# Patient Record
Sex: Female | Born: 1950
Health system: Southern US, Community
[De-identification: ages and names within clinical notes are randomized; demographics above are authoritative.]

## PROBLEM LIST (undated history)

## (undated) DIAGNOSIS — T8859XA Other complications of anesthesia, initial encounter: Secondary | ICD-10-CM

## (undated) DIAGNOSIS — F419 Anxiety disorder, unspecified: Secondary | ICD-10-CM

## (undated) DIAGNOSIS — J449 Chronic obstructive pulmonary disease, unspecified: Secondary | ICD-10-CM

## (undated) DIAGNOSIS — R0600 Dyspnea, unspecified: Secondary | ICD-10-CM

## (undated) DIAGNOSIS — G43909 Migraine, unspecified, not intractable, without status migrainosus: Secondary | ICD-10-CM

## (undated) DIAGNOSIS — U071 COVID-19: Secondary | ICD-10-CM

## (undated) DIAGNOSIS — K5792 Diverticulitis of intestine, part unspecified, without perforation or abscess without bleeding: Secondary | ICD-10-CM

## (undated) HISTORY — DX: Migraine, unspecified, not intractable, without status migrainosus: G43.909

## (undated) HISTORY — DX: COVID-19: U07.1

## (undated) HISTORY — DX: Anxiety disorder, unspecified: F41.9

## (undated) HISTORY — PX: CATARACT EXTRACTION: SUR2

---

## 1999-07-26 ENCOUNTER — Emergency Department (HOSPITAL_COMMUNITY): Admission: EM | Admit: 1999-07-26 | Discharge: 1999-07-26 | Payer: Self-pay | Admitting: Emergency Medicine

## 1999-08-13 ENCOUNTER — Emergency Department (HOSPITAL_COMMUNITY): Admission: EM | Admit: 1999-08-13 | Discharge: 1999-08-13 | Payer: Self-pay | Admitting: Emergency Medicine

## 2000-01-04 ENCOUNTER — Emergency Department (HOSPITAL_COMMUNITY): Admission: EM | Admit: 2000-01-04 | Discharge: 2000-01-04 | Payer: Self-pay | Admitting: Emergency Medicine

## 2000-01-08 ENCOUNTER — Emergency Department (HOSPITAL_COMMUNITY): Admission: EM | Admit: 2000-01-08 | Discharge: 2000-01-08 | Payer: Self-pay | Admitting: Emergency Medicine

## 2000-04-27 ENCOUNTER — Encounter: Payer: Self-pay | Admitting: Emergency Medicine

## 2000-04-27 ENCOUNTER — Emergency Department (HOSPITAL_COMMUNITY): Admission: EM | Admit: 2000-04-27 | Discharge: 2000-04-27 | Payer: Self-pay | Admitting: Emergency Medicine

## 2000-08-14 ENCOUNTER — Other Ambulatory Visit: Admission: RE | Admit: 2000-08-14 | Discharge: 2000-08-14 | Payer: Self-pay | Admitting: *Deleted

## 2001-01-09 ENCOUNTER — Emergency Department (HOSPITAL_COMMUNITY): Admission: EM | Admit: 2001-01-09 | Discharge: 2001-01-09 | Payer: Self-pay | Admitting: Emergency Medicine

## 2008-02-23 ENCOUNTER — Emergency Department (HOSPITAL_COMMUNITY): Admission: EM | Admit: 2008-02-23 | Discharge: 2008-02-23 | Payer: Self-pay | Admitting: Emergency Medicine

## 2010-02-20 ENCOUNTER — Emergency Department (HOSPITAL_BASED_OUTPATIENT_CLINIC_OR_DEPARTMENT_OTHER)
Admission: EM | Admit: 2010-02-20 | Discharge: 2010-02-20 | Payer: Self-pay | Source: Home / Self Care | Admitting: Emergency Medicine

## 2010-05-11 ENCOUNTER — Emergency Department (HOSPITAL_COMMUNITY): Payer: Self-pay

## 2010-05-11 ENCOUNTER — Inpatient Hospital Stay (HOSPITAL_COMMUNITY)
Admission: EM | Admit: 2010-05-11 | Discharge: 2010-05-14 | DRG: 392 | Disposition: A | Discharge status: Home / Self Care | Payer: Self-pay | Attending: Internal Medicine | Admitting: Internal Medicine

## 2010-05-11 DIAGNOSIS — J4489 Other specified chronic obstructive pulmonary disease: Secondary | ICD-10-CM | POA: Diagnosis present

## 2010-05-11 DIAGNOSIS — K921 Melena: Secondary | ICD-10-CM | POA: Diagnosis present

## 2010-05-11 DIAGNOSIS — J449 Chronic obstructive pulmonary disease, unspecified: Secondary | ICD-10-CM | POA: Diagnosis present

## 2010-05-11 DIAGNOSIS — K5732 Diverticulitis of large intestine without perforation or abscess without bleeding: Principal | ICD-10-CM | POA: Diagnosis present

## 2010-05-11 LAB — URINALYSIS, ROUTINE W REFLEX MICROSCOPIC
Glucose, UA: NEGATIVE mg/dL
Ketones, ur: NEGATIVE mg/dL
Leukocytes, UA: NEGATIVE
Specific Gravity, Urine: 1.008 (ref 1.005–1.030)
pH: 6 (ref 5.0–8.0)

## 2010-05-11 LAB — COMPREHENSIVE METABOLIC PANEL
ALT: 21 U/L (ref 0–35)
AST: 23 U/L (ref 0–37)
Albumin: 3.4 g/dL — ABNORMAL LOW (ref 3.5–5.2)
Alkaline Phosphatase: 94 U/L (ref 39–117)
BUN: 12 mg/dL (ref 6–23)
CO2: 25 mEq/L (ref 19–32)
Calcium: 8.8 mg/dL (ref 8.4–10.5)
Chloride: 99 mEq/L (ref 96–112)
Creatinine, Ser: 0.79 mg/dL (ref 0.4–1.2)
GFR calc Af Amer: >60 mL/min (ref >60)
GFR calc non Af Amer: >60 mL/min (ref >60)
Glucose, Bld: 94 mg/dL (ref 70–99)
Potassium: 4 mEq/L (ref 3.5–5.1)
Sodium: 133 mEq/L — ABNORMAL LOW (ref 135–145)
Total Bilirubin: 1.5 mg/dL — ABNORMAL HIGH (ref 0.3–1.2)
Total Protein: 7.5 g/dL (ref 6.0–8.3)

## 2010-05-11 LAB — DIFFERENTIAL
Basophils Relative: 0 % (ref 0–1)
Lymphs Abs: 1.1 10*3/uL (ref 0.7–4.0)
Monocytes Relative: 5 % (ref 3–12)
Neutro Abs: 9.6 10*3/uL — ABNORMAL HIGH (ref 1.7–7.7)
Neutrophils Relative %: 84 % — ABNORMAL HIGH (ref 43–77)

## 2010-05-11 LAB — CBC
Hemoglobin: 13.2 g/dL (ref 12.0–15.0)
MCH: 28.9 pg (ref 26.0–34.0)
MCV: 85.7 fL (ref 78.0–100.0)
RBC: 4.56 MIL/uL (ref 3.87–5.11)
WBC: 11.4 10*3/uL — ABNORMAL HIGH (ref 4.0–10.5)

## 2010-05-11 LAB — URINE MICROSCOPIC-ADD ON

## 2010-05-11 LAB — LIPASE, BLOOD: Lipase: 22 U/L (ref 11–59)

## 2010-05-11 MED ORDER — IOHEXOL 300 MG/ML  SOLN
125.0000 mL | Freq: Once | INTRAMUSCULAR | Status: AC | PRN
  Administered 2010-05-11: 125 mL via INTRAVENOUS

## 2010-05-12 LAB — COMPREHENSIVE METABOLIC PANEL
Albumin: 2.5 g/dL — ABNORMAL LOW (ref 3.5–5.2)
BUN: 10 mg/dL (ref 6–23)
Calcium: 7.5 mg/dL — ABNORMAL LOW (ref 8.4–10.5)
Creatinine, Ser: 0.8 mg/dL (ref 0.4–1.2)
Glucose, Bld: 83 mg/dL (ref 70–99)
Potassium: 3.7 mEq/L (ref 3.5–5.1)
Total Protein: 5.7 g/dL — ABNORMAL LOW (ref 6.0–8.3)

## 2010-05-12 LAB — CBC
HCT: 34.6 % — ABNORMAL LOW (ref 36.0–46.0)
Hemoglobin: 11.4 g/dL — ABNORMAL LOW (ref 12.0–15.0)
MCH: 28 pg (ref 26.0–34.0)
MCHC: 32.9 g/dL (ref 30.0–36.0)
Platelets: 192 10*3/uL (ref 150–400)
RDW: 12.6 % (ref 11.5–15.5)
WBC: 6.5 10*3/uL (ref 4.0–10.5)

## 2010-05-12 LAB — PHOSPHORUS: Phosphorus: 2.4 mg/dL (ref 2.3–4.6)

## 2010-05-12 LAB — APTT: aPTT: 34 seconds (ref 24–37)

## 2010-05-12 LAB — PROTIME-INR
INR: 1.07 (ref 0.00–1.49)
Prothrombin Time: 14.1 seconds (ref 11.6–15.2)

## 2010-05-12 LAB — MAGNESIUM: Magnesium: 2.1 mg/dL (ref 1.5–2.5)

## 2010-05-13 LAB — DIFFERENTIAL
Basophils Absolute: 0 10*3/uL (ref 0.0–0.1)
Eosinophils Relative: 2 % (ref 0–5)
Lymphocytes Relative: 19 % (ref 12–46)
Lymphs Abs: 1.1 10*3/uL (ref 0.7–4.0)
Neutro Abs: 4.3 10*3/uL (ref 1.7–7.7)
Neutrophils Relative %: 73 % (ref 43–77)

## 2010-05-13 LAB — CBC
HCT: 34 % — ABNORMAL LOW (ref 36.0–46.0)
MCV: 84.6 fL (ref 78.0–100.0)
Platelets: 213 10*3/uL (ref 150–400)
RBC: 4.02 MIL/uL (ref 3.87–5.11)
RDW: 12.3 % (ref 11.5–15.5)
WBC: 5.9 10*3/uL (ref 4.0–10.5)

## 2010-05-13 LAB — BASIC METABOLIC PANEL
BUN: 8 mg/dL (ref 6–23)
Chloride: 111 mEq/L (ref 96–112)
GFR calc non Af Amer: >60 mL/min (ref >60)
Glucose, Bld: 92 mg/dL (ref 70–99)
Potassium: 3.5 mEq/L (ref 3.5–5.1)
Sodium: 139 mEq/L (ref 135–145)

## 2010-05-13 NOTE — Consult Note (Signed)
NAME:  [name] [name], [name] [name]                ACCOUNT NO.:  [id number]  MEDICAL RECORD NO.:  [id number]           PATIENT TYPE:  I  LOCATION:  1519                         FACILITY:  [facility]  PHYSICIAN:  Adolph Pollack, M.D.DATE OF BIRTH:  07/06/1950  DATE OF CONSULTATION:  05/11/2010 DATE OF DISCHARGE:                                CONSULTATION   REFERRING PHYSICIAN:  Antoine Primas, DO, from [facility] Emergency Department.  REASON FOR CONSULTATION:  Diverticulitis.  HISTORY:  [name] [name] is a 60 year old female who 4 days ago began having lower abdominal pain that began progressively increasing.  The pain was made worse by eating.  She started having diarrhea as well.  No fever. Because of the persistence of the pain, she presented to the emergency room where she was evaluated.  She is known to have a mild leukocytosis and lower abdominal tenderness.  She subsequently underwent a CT scan of the abdomen demonstrating findings consistent with some sigmoiddiverticulitis and reactive small bowel segment thickening with a very small locule of extraluminal air near this process.  She is being admitted by [facility], so we were asked to see her as well.  PAST MEDICAL HISTORY:  COPD.  PREVIOUS OPERATIONS:  Cesarean section.  ALLERGIES:  None.  MEDICATIONS:  None.  SOCIAL HISTORY:  She is initially from [place] and she is here with her son.  She speaks and understands [place] well.  She is a former smoker and quit 3 months ago.  No recreational drug use.  Occasional alcohol use.  REVIEW OF SYSTEMS:  CARDIAC:  Denies hypertension, diabetes.  GI: Denies peptic ulcer disease, hematochezia.  GU:  Denies any kidney stones or dysuria.  ENDOCRINE:  Denies diabetes, hypercholesterolemia. NEUROLOGIC:  No strokes or seizures.  HEMATOLOGIC:  No bleeding disorders or blood clots.  PHYSICAL EXAMINATION:  GENERAL:  Overweight female in no acute distress, pleasant, and cooperative. VITAL  SIGNS:  Temperature is 97.8, blood pressure 102/66, pulse 91, respiratory rate 18. NECK:  Supple without mass. EYES:  Extraocular motions intact.  No icterus. RESPIRATORY:  Breath sounds distant, but equal and clear. CARDIOVASCULAR:  Regular rate, regular rhythm. ABDOMEN:  Soft with some left lower quadrant tenderness to palpation and percussion.  No palpable masses noted.  Hypoactive bowel sounds noted. MUSCULOSKELETAL:  No edema.  LABORATORY DATA:  Notable for white blood cell count of 11,400 with a leftward shift.  Hemoglobin 13.2.  Sodium 133, otherwise electrolytes within normal limits.  Albumin, slightly low at 3.4.  Urinalysis essentially negative.  CT scan was reviewed.  IMPRESSION:  Acute sigmoid diverticulitis, likely with some reactive small bowel edema.  No indication for urgent surgical intervention at this time.  RECOMMENDATIONS: 1. IV antibiotics and bowel rest until she is pain-free, white cell     count is normal, and she is no longer tender.  Diet can then be     started and advanced to a solid diet at which time she could be     switched over to oral antibiotics and discharged home on these. 2. Would obtain a colonoscopy in 4-6 weeks. 3. Unless she worsens  or has recurring episodes, I do not think she     needs any operative intervention at this time.     Adolph Pollack, M.D.     Kari Baars  D:  05/11/2010  T:  05/12/2010  Job:  [id number]  Electronically Signed by Avel Peace M.D. on 05/13/2010 04:25:39 PM

## 2010-05-13 NOTE — H&P (Signed)
[name] [name], PISCITELLI                ACCOUNT NO.:  [id number]  MEDICAL RECORD NO.:  [id number]           PATIENT TYPE:  E  LOCATION:  WLED                         FACILITY[facility]  [facility]  PHYSICIAN:  Tana Felts, MD     DATE OF BIRTH:  06/18/1950  DATE OF ADMISSION:  05/11/2010 DATE OF DISCHARGE:                             HISTORY & PHYSICAL   CHIEF COMPLAINT:  Abdominal pain.  HISTORY OF PRESENT ILLNESS:  This is a 60 year old woman originally from [place], who presents with 4 days of abdominal pain, started off in her lower abdomen.  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  Unfortunately, as of this morning, her pain had actually increased.  She was afraid to eat even though she did have a sensation of hunger and was having diarrhea, which was nonbloody.  She did try to drink some pineapple juice hoping that this will make things feel better and ate a little bit of potatoes and although she did keep down her food, her pain increased, so she went to see primary care physician in the community, who recommended that she go immediately to the emergency room.  She was given some hydrocodone by her son, who had some leftover from his wisdom teeth and she says this helps a little bit.  When I see her, she says that her pain has improved quite a bit and that she had hoped she would not have to stay in the hospital.  In the emergency room, she was given IV fluids and some pain medication and was evaluated by general surgery with whom I discussed the case.  REVIEW OF SYSTEMS:  Twelve-point review of systems is negative except as noted above.  PAST MEDICAL HISTORY:  COPD.  FAMILY HISTORY:  Noncontributory.  PAST SURGICAL HISTORY:  Cesarean section.  SOCIAL HISTORY:  Patient quit smoking about 3 months ago though she had one cigarette today to calm  her nerves.  She is a occasional drinker and has no history of drug use.  She is originally from [place], but has not traveled outside of the [place] in over 2 years.  She works as Artist.  ALLERGIES:  No known drug allergies.  MEDICATIONS:  None.  PHYSICAL EXAMINATION:  VITAL SIGNS:  Temperature 97.8, pulse 91, blood pressure 102/66, respiratory 18, satting 97% on room air. GENERAL:  This is a well-nourished, slightly overweight Hispanic woman, who is mildly tearful, very cooperative with the interview and exam. HEENT:  Pupils are equal, round and reactive to light, slightly constricted.  She has no scleral icterus or conjunctivitis.  She has moist mucous membranes.  Oropharynx is clear. NECK:  Supple. LUNGS:  Clear to auscultation bilaterally, though breath sounds were somewhat diminished. CARDIAC EXAMINATION:  Reveals regular rate and rhythm.  No murmurs, rubs or gallops. ABDOMEN:  Soft, mildly tender in the right upper quadrant epigastric region and more prominently tender in the left lower quadrant with some rebound in that  area, though no guarding. EXTREMITIES:  Warm and well-perfused with no cyanosis, clubbing or edema and good peripheral pulses. SKIN:  Slightly dry. NEUROLOGICALLY:  She is grossly intact.  No focal deficits.  LABORATORY DATA:  CBC showed a white count of 11.4, hemoglobin of 13.2, platelets 201,000 with slight left shift, 84% neutrophils.  Lactate was normal at 1.2.  CMP was notable for mildly elevated bilirubin at 1.5, slightly low sodium and albumin, otherwise normal.  Lipase is normal. Urinalysis and UA were unremarkable.  DIAGNOSTIC STUDIES:  CT scan was performed and revealed edema and inflammation involving the sigmoid mesocolon tracking to a short segment of small bowel, which has some wall thickening.  On the CT it was unclear which bowel was the source of the inflammation.  There was a small locular extraluminal gas between  the small bowel loop and the colon thought to be related to colonic diverticulitis with microperforation and secondary irritation of the small bowel loop.  A small bowel perforation cannot be excluded.  IMPRESSION AND PLAN:  This is a 60 year old woman who is generally healthy, who presents with 4 days of acute abdominal pain and anorexia and is found to have what is likely colonic diverticulitis.  After discussion with the surgeon, we decided to initially manage her medically.  We will start her on Zosyn, continue intravenous fluids and keep her n.p.o. with the plan to advance her diet once her pain has resolved and her abdominal exam is normal.  The patient is in agreement with this plan.  We will also give her intravenous morphine for pain and intravenous Zofran for nausea as needed and Lovenox for deep venous thrombosis prophylaxis.  The patient will also need a social work consult as she is uninsured, does not have a primary care physician to lead coordination of care after discharge.     Tana Felts, MD     NB/MEDQ  D:  05/11/2010  T:  05/11/2010  Job:  [id number]  Electronically Signed by Tana Felts M.D. on 05/13/2010 09:35:47 AM

## 2010-05-23 NOTE — Discharge Summary (Signed)
NAMESEJAL, COFIELD                ACCOUNT NO.:  [id number]  MEDICAL RECORD NO.:  [id number]           PATIENT TYPE:  I  LOCATION:  1519                         [facility]ACILITY[facility]  [facility]  PHYSICIAN:  Altha Harm, MDDATE OF BIRTH:  May 16, 1950  DATE OF ADMISSION:  05/11/2010 DATE OF DISCHARGE:  05/14/2010                              DISCHARGE SUMMARY   DISCHARGE DISPOSITION:  Home.  FINAL DISCHARGE DIAGNOSES: 1. Diverticulitis. 2. Microperforation of the sigmoid colon. 3. Abdominal pain, improved. 4. Past history of chronic obstructive pulmonary disease quiescent.  DISCHARGE MEDICATIONS: 1. Ciprofloxacin 500 mg p.o. b.i.d. for 10 days. 2. Flagyl 500 mg p.o. q.8 h. for 10 days. 3. Calcium carbonate over-the-counter 1 tablet p.o. daily. 4. Glucosamine and Chondroitin over-the-counter 1 tablet p.o. daily. 5. Ibuprofen 400-600 mg p.o. q.8 h. p.r.n. pain. 6. Icy hot menthol patch transdermally every 8 hours as needed. 7. Multivitamin 1 tablet p.o. daily. 8. Vitamin E over-the-counter 1 capsule p.o. daily.  CONSULTANT:  Adolph Pollack, M.D., General Surgery.  PROCEDURES:  None.  DIAGNOSTIC STUDIES:  CT abdomen and pelvis with contrast, which shows edema, inflammation identified in the sigmoid mesocolon, which tracks directly to a very short segment of abdominal-appearing small bowel. Bowel wall thickening actually appears more substantial in the small bowel, then in the sigmoid colon where there is underlying diverticular change in this region of the sigmoid colon.  It is impossible to tell from the CT imaging, which bowel loop has the extent of inflammatory change.  While no fluid collection or abscesses evidence, there is a small locule of extraluminal gas position, equal distance between the small bowel loop and the colon.  Given the very short segment of small bowel involvement, I suspect these changes are related to colonic diverticulitis with microperforation and  secondary to irritation of the adjacent small bowel loops, with a primary small bowel microperforation causing secondary inflammation of the sigmoid colon cannot be excluded.  PRIMARY CARE PHYSICIAN:  Unassigned.  The patient then tends to make an appointment with Dr. Della Goo.  CODE STATUS:  Full code.  ALLERGIES:  No known drug allergies.  CHIEF COMPLAINT:  Abdominal pain.  HISTORY OF PRESENT ILLNESS:  Please refer to the H and P by Dr. Tana Felts for details of the HPI.  However, in short this is a 60 year old Hispanic female who presents to the emergency with abdominal pain, which she has had for several days.  The patient was brought to an urgent care and examination showed rebound.  She was sent to the emergency room for further evaluation and management.  FINDINGS:  On the CT scan was noted above and the patient was referred to [facility] for evaluation and admission.  HOSPITAL COURSE: 1. Sigmoid diverticulitis with microperforation.  The patient was seen     in consultation with surgery who recommended broad-spectrum     antibiotics and bowel rest.  The patient was then started on a     clear liquid diet and advance as tolerated a low-fiber diet, which     she tolerated well.  The patient is to complete another  10 days of     antibiotics to complete 14 days.  She should have a colonoscopy     performed in 6-8 weeks as per recommendation of General Surgery.  I     have spoken with the PA, Tresa Endo, who states that Dr. Andrey Campanile will be     happy to see the patient in the office and to make the referred for     colonoscopy alternating with the patient can see Dr. Della Goo with the appointment and she can refer her colonoscopy in 6-     8 weeks.  I have spoken with the patient in Spanish and impressed     upon her the importance of the colonoscopy and the patient will     call to make that appointment.  Presently, the patient should not     return  to work until she was seen by Dr. 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  Once the patient is seen by Dr. Lovell Sheehan in     evaluating the outpatient setting, she can then be released back to     work if appropriate.  PHYSICAL EXAMINATION:  VITAL SIGNS:  At the time of discharge, the patient is stable.  Her vital signs are as follows, temperature is 98.1, heart rate 81, respiratory rate 18, blood pressure 142/80, and O2 sats are 96% on room air. HEENT:  The patient is normocephalic, atraumatic.  Pupils are equally round and reactive to light and accommodation.  Extraocular movements are intact.  Oropharynx is moist.  No exudate, erythema, or lesions are noted. NECK:  Trachea is midline.  No masses.  No thyromegaly.  No JVD.  No carotid bruits. LUNGS:  Clear to auscultation.  No wheezing or rhonchi noted. CARDIOVASCULAR:  She has got normal S1 and S2.  No murmurs, rubs, or gallops noted.  PMI is nondisplaced.  No heaves or thrills on palpation. ABDOMEN:  The patient had mild tenderness in the left lower quadrant, however, there is no guarding, rebound, masses or hepatosplenomegaly noted. EXTREMITIES:  No clubbing, cyanosis, or edema. NEUROLOGIC:  There are no focal neurological deficits. PSYCHIATRIC:  She is alert and oriented x3.  Good insight and cognition. Good recent and remote recall.  Of note, the patient had a bowel movement today after having had a low- fiber diet.  There was a small amount of dark blood, which the surgeons feel is just residual old blood.  FOLLOWUP:  The patient is to follow up with Dr. Lovell Sheehan in approximately 1 week.  She should have colonoscopy done as an outpatient in 6-8 weeks.  DIETARY RESTRICTIONS:  The patient should be on a low-residue diet and I will ask the nurse to give her care notes regarding a low-residue diet.  Total time for this discharge process including  face-to-face time approximately 42 minutes.     Altha Harm, MD     MAM/MEDQ  D:  05/14/2010  T:  05/15/2010  Job:  [id number]  cc:   Della Goo, M.D. Fax: [phone]  Dr. Andrey Campanile  Electronically Signed by Marthann Schiller MD on 05/23/2010 08:12:59 AM

## 2014-10-26 ENCOUNTER — Emergency Department (HOSPITAL_COMMUNITY): Payer: 59

## 2014-10-26 ENCOUNTER — Encounter (HOSPITAL_COMMUNITY): Payer: Self-pay

## 2014-10-26 ENCOUNTER — Inpatient Hospital Stay (HOSPITAL_COMMUNITY)
Admission: EM | Admit: 2014-10-26 | Discharge: 2014-11-04 | DRG: 164 | Disposition: A | Discharge status: Home / Self Care | Payer: 59 | Attending: Surgery | Admitting: Surgery

## 2014-10-26 DIAGNOSIS — Z7951 Long term (current) use of inhaled steroids: Secondary | ICD-10-CM | POA: Diagnosis not present

## 2014-10-26 DIAGNOSIS — J930 Spontaneous tension pneumothorax: Secondary | ICD-10-CM

## 2014-10-26 DIAGNOSIS — J9383 Other pneumothorax: Principal | ICD-10-CM | POA: Diagnosis present

## 2014-10-26 DIAGNOSIS — J939 Pneumothorax, unspecified: Secondary | ICD-10-CM

## 2014-10-26 DIAGNOSIS — J9382 Other air leak: Secondary | ICD-10-CM | POA: Diagnosis not present

## 2014-10-26 DIAGNOSIS — F17210 Nicotine dependence, cigarettes, uncomplicated: Secondary | ICD-10-CM | POA: Diagnosis present

## 2014-10-26 DIAGNOSIS — J9811 Atelectasis: Secondary | ICD-10-CM | POA: Diagnosis not present

## 2014-10-26 DIAGNOSIS — Z79899 Other long term (current) drug therapy: Secondary | ICD-10-CM

## 2014-10-26 DIAGNOSIS — J449 Chronic obstructive pulmonary disease, unspecified: Secondary | ICD-10-CM | POA: Diagnosis present

## 2014-10-26 DIAGNOSIS — Z9689 Presence of other specified functional implants: Secondary | ICD-10-CM

## 2014-10-26 DIAGNOSIS — J9311 Primary spontaneous pneumothorax: Secondary | ICD-10-CM | POA: Diagnosis not present

## 2014-10-26 HISTORY — DX: Chronic obstructive pulmonary disease, unspecified: J44.9

## 2014-10-26 HISTORY — DX: Diverticulitis of intestine, part unspecified, without perforation or abscess without bleeding: K57.92

## 2014-10-26 LAB — COMPREHENSIVE METABOLIC PANEL
ALBUMIN: 4 g/dL (ref 3.5–5.0)
ALT: 26 U/L (ref 14–54)
ANION GAP: 10 (ref 5–15)
AST: 30 U/L (ref 15–41)
Alkaline Phosphatase: 86 U/L (ref 38–126)
BUN: 12 mg/dL (ref 6–20)
CHLORIDE: 107 mmol/L (ref 101–111)
CO2: 21 mmol/L — AB (ref 22–32)
Calcium: 9.6 mg/dL (ref 8.9–10.3)
Creatinine, Ser: 0.74 mg/dL (ref 0.44–1.00)
GFR calc Af Amer: >60 mL/min (ref >60)
GFR calc non Af Amer: >60 mL/min (ref >60)
GLUCOSE: 126 mg/dL — AB (ref 65–99)
POTASSIUM: 3.8 mmol/L (ref 3.5–5.1)
SODIUM: 138 mmol/L (ref 135–145)
Total Bilirubin: 0.6 mg/dL (ref 0.3–1.2)
Total Protein: 7.3 g/dL (ref 6.5–8.1)

## 2014-10-26 LAB — CBC WITH DIFFERENTIAL/PLATELET
BASOS ABS: 0 10*3/uL (ref 0.0–0.1)
BASOS PCT: 0 %
EOS ABS: 0 10*3/uL (ref 0.0–0.7)
Eosinophils Relative: 0 %
HCT: 42.4 % (ref 36.0–46.0)
Hemoglobin: 14.4 g/dL (ref 12.0–15.0)
Lymphocytes Relative: 11 %
Lymphs Abs: 0.8 10*3/uL (ref 0.7–4.0)
MCH: 28.5 pg (ref 26.0–34.0)
MCHC: 34 g/dL (ref 30.0–36.0)
MCV: 84 fL (ref 78.0–100.0)
MONO ABS: 0.1 10*3/uL (ref 0.1–1.0)
MONOS PCT: 1 %
NEUTROS PCT: 88 %
Neutro Abs: 6.6 10*3/uL (ref 1.7–7.7)
Platelets: 257 10*3/uL (ref 150–400)
RBC: 5.05 MIL/uL (ref 3.87–5.11)
RDW: 13.2 % (ref 11.5–15.5)
WBC: 7.5 10*3/uL (ref 4.0–10.5)

## 2014-10-26 LAB — I-STAT TROPONIN, ED: Troponin i, poc: 0 ng/mL (ref 0.00–0.08)

## 2014-10-26 MED ORDER — SODIUM CHLORIDE 0.9 % IJ SOLN
3.0000 mL | Freq: Two times a day (BID) | INTRAMUSCULAR | Status: DC
  Administered 2014-10-27 – 2014-10-29 (×3): 3 mL via INTRAVENOUS

## 2014-10-26 MED ORDER — SENNA 8.6 MG PO TABS
1.0000 | ORAL_TABLET | Freq: Two times a day (BID) | ORAL | Status: DC
  Administered 2014-10-27 – 2014-10-30 (×6): 8.6 mg via ORAL
  Filled 2014-10-26 (×7): qty 1

## 2014-10-26 MED ORDER — MONTELUKAST SODIUM 10 MG PO TABS
10.0000 mg | ORAL_TABLET | Freq: Every day | ORAL | Status: DC | PRN
  Filled 2014-10-26: qty 1

## 2014-10-26 MED ORDER — SODIUM CHLORIDE 0.9 % IJ SOLN: 3.0000 mL | INTRAMUSCULAR | Status: DC | PRN

## 2014-10-26 MED ORDER — DOCUSATE SODIUM 100 MG PO CAPS
100.0000 mg | ORAL_CAPSULE | Freq: Two times a day (BID) | ORAL | Status: DC
  Administered 2014-10-27 – 2014-10-30 (×6): 100 mg via ORAL
  Filled 2014-10-26 (×5): qty 1

## 2014-10-26 MED ORDER — SODIUM CHLORIDE 0.9 % IV SOLN: 250.0000 mL | INTRAVENOUS | Status: DC | PRN

## 2014-10-26 MED ORDER — IPRATROPIUM-ALBUTEROL 0.5-2.5 (3) MG/3ML IN SOLN
3.0000 mL | Freq: Three times a day (TID) | RESPIRATORY_TRACT | Status: DC | PRN
  Administered 2014-10-27 – 2014-11-02 (×6): 3 mL via RESPIRATORY_TRACT
  Filled 2014-10-26 (×7): qty 3

## 2014-10-26 MED ORDER — OXYCODONE HCL 5 MG PO TABS
10.0000 mg | ORAL_TABLET | Freq: Once | ORAL | Status: AC
  Administered 2014-10-26: 10 mg via ORAL
  Filled 2014-10-26 (×2): qty 2

## 2014-10-26 MED ORDER — ALBUTEROL SULFATE (2.5 MG/3ML) 0.083% IN NEBU
2.5000 mg | INHALATION_SOLUTION | Freq: Four times a day (QID) | RESPIRATORY_TRACT | Status: DC | PRN
  Administered 2014-10-30 – 2014-11-02 (×3): 2.5 mg via RESPIRATORY_TRACT
  Filled 2014-10-26 (×3): qty 3

## 2014-10-26 MED ORDER — KETOROLAC TROMETHAMINE 15 MG/ML IJ SOLN
15.0000 mg | Freq: Four times a day (QID) | INTRAMUSCULAR | Status: AC | PRN
  Administered 2014-10-26 – 2014-10-31 (×2): 15 mg via INTRAVENOUS
  Filled 2014-10-26: qty 1

## 2014-10-26 MED ORDER — ONDANSETRON HCL 4 MG/2ML IJ SOLN
4.0000 mg | Freq: Four times a day (QID) | INTRAMUSCULAR | Status: DC | PRN
  Administered 2014-10-27 – 2014-10-31 (×3): 4 mg via INTRAVENOUS
  Filled 2014-10-26 (×2): qty 2

## 2014-10-26 MED ORDER — BUDESONIDE-FORMOTEROL FUMARATE 80-4.5 MCG/ACT IN AERO: 2.0000 | INHALATION_SPRAY | Freq: Two times a day (BID) | RESPIRATORY_TRACT | Status: DC | PRN

## 2014-10-26 MED ORDER — OXYCODONE HCL 5 MG PO TABS
10.0000 mg | ORAL_TABLET | ORAL | Status: DC | PRN
  Administered 2014-10-27 – 2014-10-29 (×6): 10 mg via ORAL
  Filled 2014-10-26 (×6): qty 2

## 2014-10-26 MED ORDER — ACETAMINOPHEN 325 MG PO TABS
650.0000 mg | ORAL_TABLET | Freq: Four times a day (QID) | ORAL | Status: DC | PRN
  Administered 2014-10-27 – 2014-10-30 (×3): 650 mg via ORAL
  Filled 2014-10-26 (×4): qty 2

## 2014-10-26 MED ORDER — MORPHINE SULFATE (PF) 4 MG/ML IV SOLN
4.0000 mg | INTRAVENOUS | Status: DC | PRN
  Administered 2014-10-27: 4 mg via INTRAVENOUS
  Filled 2014-10-26 (×2): qty 1

## 2014-10-26 MED ORDER — ALBUTEROL SULFATE (2.5 MG/3ML) 0.083% IN NEBU: 5.0000 mg | INHALATION_SOLUTION | Freq: Once | RESPIRATORY_TRACT | Status: DC

## 2014-10-26 MED ORDER — LIDOCAINE-EPINEPHRINE (PF) 2 %-1:200000 IJ SOLN
10.0000 mL | Freq: Once | INTRAMUSCULAR | Status: AC
  Administered 2014-10-26: 10 mL via INTRADERMAL
  Filled 2014-10-26: qty 20

## 2014-10-26 MED ORDER — SODIUM CHLORIDE 0.9 % IJ SOLN
3.0000 mL | Freq: Two times a day (BID) | INTRAMUSCULAR | Status: DC
  Administered 2014-10-26 – 2014-10-30 (×9): 3 mL via INTRAVENOUS

## 2014-10-26 MED ORDER — ALBUTEROL SULFATE (2.5 MG/3ML) 0.083% IN NEBU
INHALATION_SOLUTION | RESPIRATORY_TRACT | Status: AC
  Filled 2014-10-26: qty 3

## 2014-10-26 MED ORDER — ENOXAPARIN SODIUM 40 MG/0.4ML ~~LOC~~ SOLN
40.0000 mg | Freq: Every day | SUBCUTANEOUS | Status: DC
  Administered 2014-10-27 – 2014-10-29 (×4): 40 mg via SUBCUTANEOUS
  Filled 2014-10-26 (×4): qty 0.4

## 2014-10-26 MED ORDER — ONDANSETRON HCL 4 MG PO TABS: 4.0000 mg | ORAL_TABLET | Freq: Four times a day (QID) | ORAL | Status: DC | PRN

## 2014-10-26 MED ORDER — ACETAMINOPHEN 650 MG RE SUPP: 650.0000 mg | Freq: Four times a day (QID) | RECTAL | Status: DC | PRN

## 2014-10-26 NOTE — ED Provider Notes (Signed)
CSN: [id number]     Arrival date & time 10/26/14  1938 History   First MD Initiated Contact with Patient 10/26/14 2028     Chief Complaint  Patient presents with  . Shortness of Breath   Patient is a 64 y.o. female presenting with general illness. The history is provided by the patient. No language interpreter was used.  Illness Location:  Chest Quality:  Pain, SOB Severity:  Moderate Onset quality: Gradual initially, acute worsening today. Timing:  Constant Progression:  Worsening Chronicity:  New Context:  PMHx of COPD presenting with SOB & CP. Onset 5 days ago. Denies trauma or inciting events. Progressive with acute worsening this morning. Patient denies fever, chills, cough, or previous history of similar symptoms. Associated symptoms: chest pain and shortness of breath   Associated symptoms: no abdominal pain, no congestion, no cough, no diarrhea, no fever, no headaches, no loss of consciousness, no nausea, no vomiting and no wheezing     Past Medical History  Diagnosis Date  . COPD (chronic obstructive pulmonary disease)   . Diverticulitis    Past Surgical History  Procedure Laterality Date  . Cesarean section     No family history on file. Social History  Substance Use Topics  . Smoking status: Current Every Day Smoker  . Smokeless tobacco: None  . Alcohol Use: No   OB History    No data available      Review of Systems  Constitutional: Negative for fever.  HENT: Negative for congestion.   Respiratory: Positive for shortness of breath. Negative for cough and wheezing.   Cardiovascular: Positive for chest pain.  Gastrointestinal: Negative for nausea, vomiting, abdominal pain and diarrhea.  Neurological: Negative for loss of consciousness and headaches.  All other systems reviewed and are negative.   Allergies  Review of patient's allergies indicates no known allergies.  Home Medications   Prior to Admission medications   Medication Sig Start Date End  Date Taking? Authorizing Provider  albuterol (PROVENTIL HFA;VENTOLIN HFA) 108 (90 BASE) MCG/ACT inhaler Inhale 1-2 puffs into the lungs every 6 (six) hours as needed for wheezing or shortness of breath.   Yes Historical Provider, MD  budesonide-formoterol (SYMBICORT) 80-4.5 MCG/ACT inhaler Inhale 2 puffs into the lungs 2 (two) times daily as needed (shortness of breath).   Yes Historical Provider, MD  ibuprofen (ADVIL,MOTRIN) 200 MG tablet Take 200 mg by mouth every 6 (six) hours as needed for mild pain or moderate pain.   Yes Historical Provider, MD  ipratropium-albuterol (DUONEB) 0.5-2.5 (3) MG/3ML SOLN Take 3 mLs by nebulization 3 (three) times daily as needed (shortness of breath, wheezing).  09/12/14  Yes Historical Provider, MD  montelukast (SINGULAIR) 10 MG tablet Take 10 mg by mouth daily as needed (allergies).  10/02/14  Yes Historical Provider, MD  predniSONE (DELTASONE) 10 MG tablet as directed. 6 day supply dosepack 10/26/14  Yes Historical Provider, MD   BP 136/85 mmHg  Pulse 97  Temp(Src) 98.4 F (36.9 C) (Oral)  Resp 24  Ht  (1.651 m)  Wt 178 lb 8 oz (80.967 kg)  BMI 29.70 kg/m2  SpO2 100%   Physical Exam  Constitutional: She is oriented to person, place, and time. She appears well-developed and well-nourished. She appears distressed.  HENT:  Head: Normocephalic and atraumatic.  Eyes: Conjunctivae are normal. Pupils are equal, round, and reactive to light.  Neck: Normal range of motion. Neck supple.  Cardiovascular: Regular rhythm and intact distal pulses.   Borderline tachycardia  Pulmonary/Chest: She has no wheezes. She has no rales.  Tachypneic, decreased breath sounds on left, saturations mid 90s on 2 L nasal cannula  Abdominal: Soft. Bowel sounds are normal.  Musculoskeletal: Normal range of motion.  Neurological: She is alert and oriented to person, place, and time.  Skin: Skin is warm and dry. She is not diaphoretic.  Nursing note and vitals reviewed.   ED  Course  Procedures   Labs Review Labs Reviewed  COMPREHENSIVE METABOLIC PANEL - Abnormal; Notable for the following:    CO2 21 (*)    Glucose, Bld 126 (*)    All other components within normal limits  CBC WITH DIFFERENTIAL/PLATELET  Rosezena Sensor, ED   Imaging Review Dg Chest 2 View  10/26/2014   CLINICAL DATA:  Shortness of breath for 2 days.  EXAM: CHEST  2 VIEW  COMPARISON:  02/23/2008  FINDINGS: The cardiac silhouette, mediastinal and hilar contours are within normal limits and stable. There is tortuosity and calcification of the thoracic aorta. Advanced emphysematous changes are again demonstrated with compressive atelectasis in the lower lung zones. There is a large left-sided pneumothorax estimated at 60%. No pleural effusions. The bony thorax is intact.  IMPRESSION: 1. Large left-sided pneumothorax estimated at 60% 2. Emphysematous changes and compressive lower lobe atelectasis and scarring. These results were called by telephone at the time of interpretation on 10/26/2014 at 8:20 pm to [facility], ED charge nurse, who verbally acknowledged these results.   Electronically Signed   By: Rudie Meyer M.D.   On: 10/26/2014 20:20   Dg Chest Portable 1 View  10/26/2014   CLINICAL DATA:  Left chest tube placed.  EXAM: PORTABLE CHEST 1 VIEW  COMPARISON:  10/26/2014  FINDINGS: Interval placement a left chest tube with near complete evacuation of the left pneumothorax. There is probably a small residual apical pneumothorax. There is re-expansion of most of the left lung with residual atelectasis in the left lung base. Right lung is expanded. Heart size and pulmonary vascularity are normal. Calcification of the aorta.  IMPRESSION: Interval placement of left chest tube with near complete evacuation of previous left pneumothorax. Only small residual pneumothorax remains. Left lung is re-expanded with residual atelectasis in the left lung base.   Electronically Signed   By: Burman Nieves M.D.   On:  10/26/2014 21:41   I have personally reviewed and evaluated these images and lab results as part of my medical decision-making.   EKG Interpretation None      MDM  Ms. Chasse is a 64 yo female w/ PMHx of COPD presenting with SOB & CP. Onset 5 days ago. Denies trauma or inciting events. Progressive with acute worsening this morning. Patient denies fever, chills, cough, or previous history of similar symptoms.  Elderly female lying in the stretcher in moderate distress secondary to pain. Afebrile. Borderline tachycardic. BP 130's/100s. Tachypneic, decreased breath sounds on left, saturations mid 90's on 2 L nasal cannula Abd benign. CV RRR. Remainder of examination unremarkable.  Chest x-ray showing left-sided pneumothorax with 60% lung collapse. CBC & CMP unremarkable. I-STAT troponin undetectable. Laboratory and imaging results were personally reviewed by myself and used in the medical decision making of this patient's treatment and disposition.  Cardiothoracic surgery consult at and informed about condition of patient and evaluated in the emergency department with placement of left-sided chest tube.  Pt noted to cardiothoracic surgery for further evaluation and management of large left-sided pneumothorax status post chest tube placement. Pt understands and agrees with  the plan and has no further questions or concerns.   Pt care discussed with and followed by my attending, Dr. Thurmon Fair, MD Pager [phone]  Final diagnoses:  Pneumothorax, left    Angelina Ok, MD 10/26/14 [id number]  Elwin Mocha, MD 10/26/14 [phone]

## 2014-10-26 NOTE — ED Notes (Signed)
Pt reports SOB, onset yesterday, hx of COPD. She went to PCP today, given nebulizer and a prescription for prednisone but states she not gotten any better. SOB worse with exertion.

## 2014-10-26 NOTE — CV Procedure (Signed)
Chest Tube Insertion Procedure Note  Indications:  Clinically significant left Pneumothorax  Pre-operative Diagnosis: Left Pneumothorax  Post-operative Diagnosis: Left Pneumothorax  Procedure Details  Informed consent was obtained for the procedure, including sedation.  Risks of lung perforation, hemorrhage, arrhythmia, and adverse drug reaction were discussed.   After sterile skin prep, using standard technique, a 20 French tube was placed in the left lateral 7th rib space.  Findings: Large rush of air  Estimated Blood Loss:  Minimal         Specimens:  None              Complications:  None; patient tolerated the procedure well.         Disposition: admit         Condition: stable  Attending Attestation: I performed the procedure.

## 2014-10-26 NOTE — ED Notes (Signed)
Pt states she has already had 4 or 5 breathing treatments of albuterol/atrovent today.

## 2014-10-26 NOTE — H&P (Signed)
[address]       [place], [address]             [phone]      Cardithoracic Surgery Admission History and Physical    Jaleiah Asay is an 64 y.o. female.   Chief Complaint: Shortness of breath and left chest pain HPI:   The patient is a 64 year old Hispanic woman with know COPD who smokes 1 ppd who had onset of left chest pain and shortness of breath about 5 days ago. Her symptoms progressed this am and she went to her PCP. She was apparently wheezing a lot and refused to go to the ER so she was started on a Prednisone taper in addition to her usual COPD meds. She continued to have significant shortness of breath throughout the day and was brought to the ER by her son. CXE showed a 60% left pneumothorax.  Past Medical History  Diagnosis Date  . COPD (chronic obstructive pulmonary disease)   . Diverticulitis     Past Surgical History  Procedure Laterality Date  . Cesarean section      No family history on file. Social History:  reports that she has been smoking.  She does not have any smokeless tobacco history on file. She reports that she does not drink alcohol. Her drug history is not on file.  Allergies: No Known Allergies   (Not in a hospital admission)  Results for orders placed or performed during the hospital encounter of 10/26/14 (from the past 48 hour(s))  I-Stat Troponin, ED (not at [facility])     Status: None   Collection Time: 10/26/14  7:56 PM  Result Value Ref Range   Troponin i, poc 0.00 0.00 - 0.08 ng/mL   Comment 3            Comment: Due to the release kinetics of cTnI, a negative result within the first hours of the onset of symptoms does not rule out myocardial infarction with certainty. If myocardial infarction is still suspected, repeat the test at appropriate intervals.   CBC with Differential     Status: None   Collection Time: 10/26/14  7:59 PM  Result Value Ref Range   WBC 7.5 4.0 - 10.5 K/uL   RBC 5.05 3.87 - 5.11  MIL/uL   Hemoglobin 14.4 12.0 - 15.0 g/dL   HCT 42.4 36.0 - 46.0 %   MCV 84.0 78.0 - 100.0 fL   MCH 28.5 26.0 - 34.0 pg   MCHC 34.0 30.0 - 36.0 g/dL   RDW 13.2 11.5 - 15.5 %   Platelets 257 150 - 400 K/uL   Neutrophils Relative % 88 %   Neutro Abs 6.6 1.7 - 7.7 K/uL   Lymphocytes Relative 11 %   Lymphs Abs 0.8 0.7 - 4.0 K/uL   Monocytes Relative 1 %   Monocytes Absolute 0.1 0.1 - 1.0 K/uL   Eosinophils Relative 0 %   Eosinophils Absolute 0.0 0.0 - 0.7 K/uL   Basophils Relative 0 %   Basophils Absolute 0.0 0.0 - 0.1 K/uL  Comprehensive metabolic panel     Status: Abnormal   Collection Time: 10/26/14  7:59 PM  Result Value Ref Range   Sodium 138 135 - 145 mmol/L   Potassium 3.8 3.5 - 5.1 mmol/L   Chloride 107 101 - 111 mmol/L   CO2 21 (L) 22 - 32 mmol/L   Glucose, Bld 126 (H) 65 - 99 mg/dL  BUN 12 6 - 20 mg/dL   Creatinine, Ser 0.74 0.44 - 1.00 mg/dL   Calcium 9.6 8.9 - 10.3 mg/dL   Total Protein 7.3 6.5 - 8.1 g/dL   Albumin 4.0 3.5 - 5.0 g/dL   AST 30 15 - 41 U/L   ALT 26 14 - 54 U/L   Alkaline Phosphatase 86 38 - 126 U/L   Total Bilirubin 0.6 0.3 - 1.2 mg/dL   GFR calc non Af Amer >60 >60 mL/min   GFR calc Af Amer >60 >60 mL/min    Comment: (NOTE) The eGFR has been calculated using the CKD EPI equation. This calculation has not been validated in all clinical situations. eGFR's persistently <60 mL/min signify possible Chronic Kidney Disease.    Anion gap 10 5 - 15   Dg Chest 2 View  10/26/2014   CLINICAL DATA:  Shortness of breath for 2 days.  EXAM: CHEST  2 VIEW  COMPARISON:  02/23/2008  FINDINGS: The cardiac silhouette, mediastinal and hilar contours are within normal limits and stable. There is tortuosity and calcification of the thoracic aorta. Advanced emphysematous changes are again demonstrated with compressive atelectasis in the lower lung zones. There is a large left-sided pneumothorax estimated at 60%. No pleural effusions. The bony thorax is intact.   IMPRESSION: 1. Large left-sided pneumothorax estimated at 60% 2. Emphysematous changes and compressive lower lobe atelectasis and scarring. These results were called by telephone at the time of interpretation on 10/26/2014 at 8:20 pm to [facility], ED charge nurse, who verbally acknowledged these results.   Electronically Signed   By: Marijo Sanes M.D.   On: 10/26/2014 20:20   Dg Chest Portable 1 View  10/26/2014   CLINICAL DATA:  Left chest tube placed.  EXAM: PORTABLE CHEST 1 VIEW  COMPARISON:  10/26/2014  FINDINGS: Interval placement a left chest tube with near complete evacuation of the left pneumothorax. There is probably a small residual apical pneumothorax. There is re-expansion of most of the left lung with residual atelectasis in the left lung base. Right lung is expanded. Heart size and pulmonary vascularity are normal. Calcification of the aorta.  IMPRESSION: Interval placement of left chest tube with near complete evacuation of previous left pneumothorax. Only small residual pneumothorax remains. Left lung is re-expanded with residual atelectasis in the left lung base.   Electronically Signed   By: Lucienne Capers M.D.   On: 10/26/2014 21:41    Review of Systems  Constitutional: Positive for malaise/fatigue. Negative for fever, chills and weight loss.  HENT: Negative.   Eyes: Negative.   Respiratory: Positive for cough, shortness of breath and wheezing. Negative for hemoptysis and sputum production.   Cardiovascular: Positive for chest pain and orthopnea. Negative for palpitations, leg swelling and PND.  Gastrointestinal: Negative.   Genitourinary: Negative.   Musculoskeletal: Negative.   Skin: Negative.   Neurological: Negative.   Endo/Heme/Allergies: Negative.   Psychiatric/Behavioral: Negative.     Blood pressure 136/85, pulse 97, temperature 98.4 F (36.9 C), temperature source Oral, resp. rate 24, height _0  (1.651 m), weight 80.967 kg (178 lb 8 oz), SpO2 100 %. Physical Exam    Constitutional: She is oriented to person, place, and time. She appears well-developed and well-nourished.  Looks uncomfortable  HENT:  Head: Normocephalic and atraumatic.  Mouth/Throat: Oropharynx is clear and moist.  Eyes: EOM are normal. Pupils are equal, round, and reactive to light.  Neck: Normal range of motion. Neck supple. No JVD present. No tracheal deviation present.  Cardiovascular: Normal rate, regular rhythm and intact distal pulses.  Exam reveals no gallop and no friction rub.   No murmur heard. Respiratory: No stridor. No respiratory distress. She has wheezes.  Increased work of breathing. Decreased breath sounds on the left  GI: Soft. Bowel sounds are normal. She exhibits no distension and no mass. There is no tenderness.  Musculoskeletal: Normal range of motion. She exhibits no edema or tenderness.  Lymphadenopathy:    She has no cervical adenopathy.  Neurological: She is alert and oriented to person, place, and time.  Skin: Skin is warm and dry.  Psychiatric: She has a normal mood and affect.     Assessment/Plan  This 64 year old heavy lifelong smoker with COPD presents with her first episode of spontaneous left pneumothorax. A chest tube was inserted into the left pleural space with resolution of the ptx and a 1 chamber intermittent air leak. Admit for chest tube management, pain control. I strongly advised her and her son to quit smoking.   Gaye Pollack 10/26/2014, 10:17 PM

## 2014-10-27 ENCOUNTER — Inpatient Hospital Stay (HOSPITAL_COMMUNITY): Payer: 59

## 2014-10-27 MED ORDER — CALCIUM CARBONATE ANTACID 500 MG PO CHEW
1.0000 | CHEWABLE_TABLET | Freq: Three times a day (TID) | ORAL | Status: DC | PRN
  Administered 2014-10-27: 200 mg via ORAL
  Filled 2014-10-27 (×2): qty 1

## 2014-10-27 MED ORDER — RISAQUAD PO CAPS
2.0000 | ORAL_CAPSULE | Freq: Two times a day (BID) | ORAL | Status: DC
  Administered 2014-10-27 – 2014-11-04 (×15): 2 via ORAL
  Filled 2014-10-27 (×21): qty 2

## 2014-10-27 NOTE — Discharge Summary (Signed)
[address]       [place] [address]             [phone]            Discharge Summary  Name: [name] [name] DOB: 1950-06-27 64 y.o. MRN: [id number]   Admission Date: 10/26/2014 Discharge Date: 11/04/2014    Admitting Diagnosis: Spontaneous left pneumothorax   Discharge Diagnosis:  Spontaneous left pneumothorax Persistent air leak  Past Medical History  Diagnosis Date  . COPD (chronic obstructive pulmonary disease)   . Diverticulitis    Tobacco abuse    Procedures: 1. Placement of left chest tube - 10/26/2014  2. Left video assisted thoracoscopy - 10/31/2014  Stapling of apical bleb  Mechanical pleurodesis    HPI:  The patient is a 64 y.o. female smoker with known COPD.  She developed left sided chest pain and shortness of breath about 5 days ago.Her symptoms progressed and she went to her PCP yesterday. She was apparently wheezing a lot and refused to go to the ER, so she was started on a Prednisone taper in addition to her usual COPD meds. She continued to have significant shortness of breath throughout the day and was brought to the ER by her son. Chest x-ray showed a 60% left pneumothorax. Dr. Laneta Simmers saw the patient in the ER and a left chest tube was placed.  She was subsequently admitted for chest tube management.     Hospital Course:  The patient was admitted to [facility] on 10/26/2014. Her chest tube was placed to suction with resolution of her pneumothorax. However, when chest tube was weaned to water seal, she developed subcutaneous emphysema and a small air leak. Repeat chest x-ray showed a small basilar pneumothorax. The chest tube was left on water seal, but her air leak persisted.  Therefore, she was taken to the operating room on 10/31/2014, where she underwent a left VATS for stapling of apical bleb.    Postoperatively, she has remained stable.  Her chest tube was initially left to suction, then transitioned to water seal as  tolerated.  Her air leak resolved and chest x-ray remained stable with no pneumothorax. Chest tube was removed on postop day 3.  She has otherwise done well postoperatively.  She is ambulating in the halls without difficulty and is tolerating a regular diet.  Pain is controlled on oral pain meds. She has been weaned off supplemental oxygen.  Overall, the patient is medically stable on today's date for discharge home.    Recent vital signs:  Filed Vitals:   11/04/14 0417  BP: 126/67  Pulse: 89  Temp: 98.3 F (36.8 C)  Resp: 18    Recent laboratory studies:  CBC: No results for input(s): WBC, HGB, HCT, PLT in the last 72 hours. BMET:  No results for input(s): NA, K, CL, CO2, GLUCOSE, BUN, CREATININE, CALCIUM in the last 72 hours.  PT/INR: No results for input(s): LABPROT, INR in the last 72 hours.    Discharge Medications:     Medication List    TAKE these medications        albuterol 108 (90 BASE) MCG/ACT inhaler  Commonly known as:  PROVENTIL HFA;VENTOLIN HFA  Inhale 1-2 puffs into the lungs every 6 (six) hours as needed for wheezing or shortness of breath.     budesonide-formoterol 80-4.5 MCG/ACT inhaler  Commonly known as:  SYMBICORT  Inhale 2 puffs into the lungs 2 (two) times daily as needed (  shortness of breath).     ibuprofen 200 MG tablet  Commonly known as:  ADVIL,MOTRIN  Take 200 mg by mouth every 6 (six) hours as needed for mild pain or moderate pain.     ipratropium-albuterol 0.5-2.5 (3) MG/3ML Soln  Commonly known as:  DUONEB  Take 3 mLs by nebulization 3 (three) times daily as needed (shortness of breath, wheezing).     montelukast 10 MG tablet  Commonly known as:  SINGULAIR  Take 10 mg by mouth daily as needed (allergies).     oxyCODONE 5 MG immediate release tablet  Commonly known as:  Oxy IR/ROXICODONE  Take 1-2 tablets (5-10 mg total) by mouth every 4 (four) hours as needed for severe pain.     predniSONE 10 MG tablet  Commonly known as:   DELTASONE  as directed. 6 day supply dosepack         Discharge Instructions:  The patient is to refrain from driving, heavy lifting or strenuous activity.  May shower daily and clean incisions with soap and water.  May resume regular diet.    Follow Up: Follow-up Information    Follow up with Alleen Borne, MD On 11/29/2014.   Specialty:  Cardiothoracic Surgery   Why:  Have a chest x-ray at [facility] at 9:30, then see MD at 10:30   Contact information:   [address] [place] [address] [phone]       Follow up with TCTS RN On 11/09/2014.   Why:  For suture removal at 10:00        Follow-up Information    Follow up with Alleen Borne, MD On 11/29/2014.   Specialty:  Cardiothoracic Surgery   Why:  Have a chest x-ray at [facility] at 9:30, then see MD at 10:30   Contact information:   [address] [place] [address] [phone]       Follow up with TCTS RN On 11/09/2014.   Why:  For suture removal at 10:00       Taliah Porche 11/04/2014, 10:04 AM

## 2014-10-27 NOTE — Progress Notes (Addendum)
      [address]       [place] [address]             [phone]            Subjective: Patient's only complaint is having to go to bathroom this morning. She states her breathing is better  Objective: Vital signs in last 24 hours: Temp:  [97.3 F (36.3 C)-98.5 F (36.9 C)] 97.3 F (36.3 C) (09/23 0522) Pulse Rate:  [74-104] 74 (09/23 0522) Cardiac Rhythm:  [-] Normal sinus rhythm (09/22 2249) Resp:  [20-30] 20 (09/23 0522) BP: (118-156)/(63-108) 126/79 mmHg (09/23 0522) SpO2:  [91 %-100 %] 100 % (09/23 0522) FiO2 (%):  [100 %] 100 % (09/22 2049) Weight:  [177 lb 1.6 oz (80.332 kg)-178 lb 8 oz (80.967 kg)] 177 lb 1.6 oz (80.332 kg) (09/22 2254)       Physical Exam:  Cardiovascular: RRR Pulmonary: Diminished at left apex; no rales, wheezes, or rhonchi. Abdomen: Soft, non tender, bowel sounds present. Wounds: Dressing is clean and dry.   Chest Tube: to suction and no air leak  Lab Results: CBC: Recent Labs  10/26/14 1959  WBC 7.5  HGB 14.4  HCT 42.4  PLT 257   BMET:  Recent Labs  10/26/14 1959  NA 138  K 3.8  CL 107  CO2 21*  GLUCOSE 126*  BUN 12  CREATININE 0.74  CALCIUM 9.6    PT/INR: No results for input(s): LABPROT, INR in the last 72 hours. ABG:  INR: Will add last result for INR, ABG once components are confirmed Will add last 4 CBG results once components are confirmed  Assessment/Plan:  1. CV - SR in the 70's. 2.  Pulmonary - Chest tube is to suction. There is no air leak. CXR this am appears to show small left pneumothorax and atelectasis left lung base. Likely place chest tube to water seal in am. Check CXR in am   ZIMMERMAN,DONIELLE MPA-C 10/27/2014,7:27 AM   Chart reviewed, patient examined, agree with above. I do not see any air leak and the CXR looks good. I don't think there is a ptx. I think it is a bleb. Will put tube to water seal and if CXR looks ok tomorrow and no air leak I think the tube can come  out.

## 2014-10-27 NOTE — Plan of Care (Signed)
Problem: Phase I Progression Outcomes Goal: OOB as tolerated unless otherwise ordered Outcome: Progressing Patient sitting on the side of the bed.

## 2014-10-27 NOTE — Progress Notes (Signed)
UR Completed. Samantha Claxton, RN, BSN.  [phone] 

## 2014-10-28 ENCOUNTER — Inpatient Hospital Stay (HOSPITAL_COMMUNITY): Payer: 59

## 2014-10-28 NOTE — Progress Notes (Signed)
Pt with increasing shortness of breath after ambulation to bathroom, this was after chest tube had been clamped by P Physician Assistant. PA on floor and made aware, pts Sats 98 on 2L, orders received to unclamp tube. Will continue to monitor patient. Cloer, [employer] [place]] RN

## 2014-10-28 NOTE — Progress Notes (Addendum)
      [address]       [place] [address]             [phone]            Subjective: Patient about to eat breakfast. She hopes the chest tube "comes out today"  Objective: Vital signs in last 24 hours: Temp:  [98.4 F (36.9 C)] 98.4 F (36.9 C) (09/24 0538) Pulse Rate:  [83] 83 (09/24 0538) Cardiac Rhythm:  [-] Normal sinus rhythm (09/23 2032) Resp:  [18] 18 (09/24 0538) BP: (114)/(66) 114/66 mmHg (09/24 0538) SpO2:  [90 %-97 %] 90 % (09/24 0538)    09/23 0701 - 09/24 0700 In: 243 [P.O.:240; I.V.:3] Out: 1060 [Urine:1050; Chest Tube:10]  Physical Exam:  Cardiovascular: RRR Pulmonary: Diminished at left apex; no rales, wheezes, or rhonchi. Abdomen: Soft, non tender, bowel sounds present. Wounds: Dressing is clean and dry.   Chest Tube: to water seal. There is tidling with cough but no true air leak  Lab Results: CBC:  Recent Labs  10/26/14 1959  WBC 7.5  HGB 14.4  HCT 42.4  PLT 257   BMET:   Recent Labs  10/26/14 1959  NA 138  K 3.8  CL 107  CO2 21*  GLUCOSE 126*  BUN 12  CREATININE 0.74  CALCIUM 9.6    PT/INR: No results for input(s): LABPROT, INR in the last 72 hours. ABG:  INR: Will add last result for INR, ABG once components are confirmed Will add last 4 CBG results once components are confirmed  Assessment/Plan:  1. CV - SR in the 70's. 2.  Pulmonary - Chest tube is to water seal. There is tidling with cough but no true air leak. CXR this am appears stable ( likely left apical bleb and not pneumothorax, and atelectasis left lung base). As discussed with Dr. Donata Clay, will clamp chest tube and check CXR in 4 hours. Also, check CXR in am   ZIMMERMAN,DONIELLE MPA-C 10/28/2014,7:30 AM   Patient did not tolerate chest tube clamping We'll leave to water seal and repeat x-ray in a.m. patient examined and medical record reviewed,agree with above note. Kathlee Nations Trigt III 10/28/2014

## 2014-10-29 ENCOUNTER — Inpatient Hospital Stay (HOSPITAL_COMMUNITY): Payer: 59

## 2014-10-29 MED ORDER — TRAMADOL HCL 50 MG PO TABS
50.0000 mg | ORAL_TABLET | Freq: Four times a day (QID) | ORAL | Status: DC | PRN
  Administered 2014-10-29: 50 mg via ORAL
  Administered 2014-10-29 – 2014-10-30 (×3): 100 mg via ORAL
  Administered 2014-10-30: 50 mg via ORAL
  Filled 2014-10-29: qty 1
  Filled 2014-10-29: qty 2
  Filled 2014-10-29: qty 1
  Filled 2014-10-29 (×2): qty 2

## 2014-10-29 MED ORDER — OXYCODONE HCL 5 MG PO TABS: 5.0000 mg | ORAL_TABLET | ORAL | Status: DC | PRN

## 2014-10-29 MED ORDER — IOHEXOL 300 MG/ML  SOLN
100.0000 mL | Freq: Once | INTRAMUSCULAR | Status: AC | PRN
  Administered 2014-10-29: 75 mL via INTRAVENOUS

## 2014-10-29 NOTE — Progress Notes (Addendum)
      [address]       [place] [address]             [phone]            Subjective: Patient about to eat breakfast. She had nausea and emesis after Oxy yesterday.   Objective: Vital signs in last 24 hours: Temp:  [98.1 F (36.7 C)-98.4 F (36.9 C)] 98.4 F (36.9 C) (09/25 0505) Pulse Rate:  [74-81] 80 (09/25 0505) Cardiac Rhythm:  [-] Normal sinus rhythm (09/25 0700) Resp:  [17-18] 18 (09/25 0505) BP: (112-129)/(65-72) 112/65 mmHg (09/25 0505) SpO2:  [97 %-99 %] 97 % (09/25 0505)    09/24 0701 - 09/25 0700 In: 483 [P.O.:480; I.V.:3] Out: -   Physical Exam:  Cardiovascular: RRR Pulmonary: Diminished at left apex; no rales, wheezes, or rhonchi. Abdomen: Soft, non tender, bowel sounds present. Wounds: Dressing is clean and dry.   Chest Tube: to water seal. There is no  air leak  Lab Results: CBC:  Recent Labs  10/26/14 1959  WBC 7.5  HGB 14.4  HCT 42.4  PLT 257   BMET:   Recent Labs  10/26/14 1959  NA 138  K 3.8  CL 107  CO2 21*  GLUCOSE 126*  BUN 12  CREATININE 0.74  CALCIUM 9.6    PT/INR: No results for input(s): LABPROT, INR in the last 72 hours. ABG:  INR: Will add last result for INR, ABG once components are confirmed Will add last 4 CBG results once components are confirmed  Assessment/Plan:  1. CV - SR in the 70's. 2.  Pulmonary - Chest tube is to water seal. There is no air leak. CXR this am shows no pneumothorax, subcutaneous emphysema left lateral chest wall,  and left basilar atelectasis. Per Dr. Donata Clay, chest tube clamed and will obtain a CXR in 4 hours. 3. Regarding pain control, will order Ultram and she wants to try 1 Oxy for breakthrough pain. If she has nausea or emesis today, stop Oxy.  ZIMMERMAN,DONIELLE MPA-C 10/29/2014,7:34 AM    The patient became very short of breath and anxious after chest tube was clamped Per bedside nurse there was diminished breath sounds on the left and the clamp was  removed following which the patient felt much better. CT scan of chest shows small basilar pneumothorax, no suspicious nodules or masses or adenopathy, generalized emphysema. With cough this p.m. there is an air leak present so will not pull tube at this time  patient examined and medical record reviewed,agree with above note. Kathlee Nations Trigt III 10/29/2014   . Marland Kitchen

## 2014-10-30 ENCOUNTER — Inpatient Hospital Stay (HOSPITAL_COMMUNITY): Payer: 59

## 2014-10-30 LAB — TYPE AND SCREEN
ABO/RH(D): A NEG
ANTIBODY SCREEN: NEGATIVE

## 2014-10-30 LAB — ABO/RH: ABO/RH(D): A NEG

## 2014-10-30 MED ORDER — DEXTROSE 5 % IV SOLN
1.5000 g | INTRAVENOUS | Status: AC
  Administered 2014-10-31: 1.5 g via INTRAVENOUS
  Filled 2014-10-30 (×2): qty 1.5

## 2014-10-30 NOTE — Progress Notes (Addendum)
      [address]       [place] [address]             [phone]            Subjective: Patient without complaints  Objective: Vital signs in last 24 hours: Temp:  [97.8 F (36.6 C)-98.7 F (37.1 C)] 97.8 F (36.6 C) (09/26 0437) Pulse Rate:  [80-95] 80 (09/26 0437) Cardiac Rhythm:  [-] Normal sinus rhythm (09/25 1950) Resp:  [17-18] 18 (09/26 0437) BP: (120-123)/(71-77) 122/71 mmHg (09/26 0437) SpO2:  [95 %-96 %] 96 % (09/26 0437)    09/25 0701 - 09/26 0700 In: 240 [P.O.:240] Out: -   Physical Exam:  Cardiovascular: RRR Pulmonary: Diminished throughout  Abdomen: Soft, non tender, bowel sounds present. Wounds: Dressing is clean and dry.   Chest Tube: to water seal. There is a small +1 intermittent air leak  Lab Results: CBC: No results for input(s): WBC, HGB, HCT, PLT in the last 72 hours. BMET:  No results for input(s): NA, K, CL, CO2, GLUCOSE, BUN, CREATININE, CALCIUM in the last 72 hours.  PT/INR: No results for input(s): LABPROT, INR in the last 72 hours. ABG:  INR: Will add last result for INR, ABG once components are confirmed Will add last 4 CBG results once components are confirmed  Assessment/Plan:  1. CV - SR in the 70's. 2.  Pulmonary - Chest tube is to water seal. There is a small intermittent air leak. CT scan done yesterday showed small left basilar ptx, left chest wall subcutaneous emphysema, moderate to severe emphysema.CXR this am appears stable. Chest tube to remain to water seal for now. Check CXR in am   ZIMMERMAN,DONIELLE MPA-C 10/30/2014,7:39 AM   Chart reviewed, patient examined, agree with above. Events of weekend noted. I did not see any air leak on Friday and then she developed air leak over the weekend with CT showing a small basilar and anterior ptx. She has a constant 1 chamber air leak today. I think the best course of action is to proceed with left VATS and bleb stapling, mechanical pleurodesis tomorrow to  try to resolve this air leak as fast as possible. I discussed the procedure with her including alternatives, benefits and risks including but not limited to bleeding, infection, persistent or recurrent air leak and she understands and agrees to proceed. Will schedule for tomorrow.   Marland Kitchen Marland Kitchen

## 2014-10-31 ENCOUNTER — Inpatient Hospital Stay (HOSPITAL_COMMUNITY): Payer: 59

## 2014-10-31 ENCOUNTER — Inpatient Hospital Stay (HOSPITAL_COMMUNITY): Payer: 59 | Admitting: Anesthesiology

## 2014-10-31 ENCOUNTER — Encounter (HOSPITAL_COMMUNITY)
Admission: EM | Disposition: A | Discharge status: Home / Self Care | Payer: Self-pay | Source: Home / Self Care | Attending: Surgery

## 2014-10-31 DIAGNOSIS — J9311 Primary spontaneous pneumothorax: Secondary | ICD-10-CM

## 2014-10-31 HISTORY — PX: STAPLING OF BLEBS: SHX6429

## 2014-10-31 HISTORY — PX: VIDEO ASSISTED THORACOSCOPY: SHX5073

## 2014-10-31 LAB — MRSA PCR SCREENING: MRSA BY PCR: NEGATIVE

## 2014-10-31 SURGERY — VIDEO ASSISTED THORACOSCOPY
Anesthesia: General | Site: Chest | Laterality: Left

## 2014-10-31 MED ORDER — ROCURONIUM BROMIDE 100 MG/10ML IV SOLN
INTRAVENOUS | Status: DC | PRN
  Administered 2014-10-31: 5 mg via INTRAVENOUS
  Administered 2014-10-31: 40 mg via INTRAVENOUS
  Administered 2014-10-31: 5 mg via INTRAVENOUS

## 2014-10-31 MED ORDER — DEXTROSE 5 % IV SOLN
1.5000 g | Freq: Two times a day (BID) | INTRAVENOUS | Status: AC
  Administered 2014-10-31 – 2014-11-01 (×2): 1.5 g via INTRAVENOUS
  Filled 2014-10-31 (×2): qty 1.5

## 2014-10-31 MED ORDER — KCL IN DEXTROSE-NACL 20-5-0.45 MEQ/L-%-% IV SOLN
INTRAVENOUS | Status: DC
  Administered 2014-10-31 – 2014-11-02 (×3): via INTRAVENOUS
  Filled 2014-10-31 (×4): qty 1000

## 2014-10-31 MED ORDER — POTASSIUM CHLORIDE 10 MEQ/50ML IV SOLN: 10.0000 meq | Freq: Every day | INTRAVENOUS | Status: DC | PRN

## 2014-10-31 MED ORDER — GLYCOPYRROLATE 0.2 MG/ML IJ SOLN
INTRAMUSCULAR | Status: DC | PRN
  Administered 2014-10-31: 0.4 mg via INTRAVENOUS

## 2014-10-31 MED ORDER — FENTANYL CITRATE (PF) 100 MCG/2ML IJ SOLN
INTRAMUSCULAR | Status: DC | PRN
  Administered 2014-10-31: 25 ug via INTRAVENOUS
  Administered 2014-10-31 (×3): 50 ug via INTRAVENOUS

## 2014-10-31 MED ORDER — NALOXONE HCL 0.4 MG/ML IJ SOLN: 0.4000 mg | INTRAMUSCULAR | Status: DC | PRN

## 2014-10-31 MED ORDER — ROCURONIUM BROMIDE 50 MG/5ML IV SOLN
INTRAVENOUS | Status: AC
  Filled 2014-10-31: qty 1

## 2014-10-31 MED ORDER — ONDANSETRON HCL 4 MG/2ML IJ SOLN: 4.0000 mg | Freq: Four times a day (QID) | INTRAMUSCULAR | Status: DC | PRN

## 2014-10-31 MED ORDER — ACETAMINOPHEN 500 MG PO TABS
1000.0000 mg | ORAL_TABLET | Freq: Four times a day (QID) | ORAL | Status: DC
  Administered 2014-11-01 – 2014-11-04 (×14): 1000 mg via ORAL
  Filled 2014-10-31 (×19): qty 2

## 2014-10-31 MED ORDER — PROMETHAZINE HCL 25 MG/ML IJ SOLN
INTRAMUSCULAR | Status: AC
  Filled 2014-10-31: qty 1

## 2014-10-31 MED ORDER — SENNOSIDES-DOCUSATE SODIUM 8.6-50 MG PO TABS
1.0000 | ORAL_TABLET | Freq: Every day | ORAL | Status: DC
  Administered 2014-10-31 – 2014-11-02 (×3): 1 via ORAL
  Filled 2014-10-31 (×4): qty 1

## 2014-10-31 MED ORDER — BISACODYL 5 MG PO TBEC
10.0000 mg | DELAYED_RELEASE_TABLET | Freq: Every day | ORAL | Status: DC
  Administered 2014-11-01 – 2014-11-03 (×3): 10 mg via ORAL
  Filled 2014-10-31 (×4): qty 2

## 2014-10-31 MED ORDER — HYDROMORPHONE HCL 1 MG/ML IJ SOLN: 0.2500 mg | INTRAMUSCULAR | Status: DC | PRN

## 2014-10-31 MED ORDER — PROMETHAZINE HCL 25 MG/ML IJ SOLN
6.2500 mg | INTRAMUSCULAR | Status: DC | PRN
  Administered 2014-10-31: 6.25 mg via INTRAVENOUS

## 2014-10-31 MED ORDER — MIDAZOLAM HCL 2 MG/2ML IJ SOLN
INTRAMUSCULAR | Status: AC
  Filled 2014-10-31: qty 4

## 2014-10-31 MED ORDER — NEOSTIGMINE METHYLSULFATE 10 MG/10ML IV SOLN
INTRAVENOUS | Status: DC | PRN
  Administered 2014-10-31: 3 mg via INTRAVENOUS

## 2014-10-31 MED ORDER — PROPOFOL 10 MG/ML IV BOLUS
INTRAVENOUS | Status: AC
  Filled 2014-10-31: qty 20

## 2014-10-31 MED ORDER — MEPERIDINE HCL 25 MG/ML IJ SOLN: 6.2500 mg | INTRAMUSCULAR | Status: DC | PRN

## 2014-10-31 MED ORDER — LACTATED RINGERS IV SOLN
INTRAVENOUS | Status: DC | PRN
  Administered 2014-10-31 (×2): via INTRAVENOUS

## 2014-10-31 MED ORDER — FENTANYL CITRATE (PF) 250 MCG/5ML IJ SOLN
INTRAMUSCULAR | Status: AC
  Filled 2014-10-31: qty 5

## 2014-10-31 MED ORDER — ACETAMINOPHEN 160 MG/5ML PO SOLN: 1000.0000 mg | Freq: Four times a day (QID) | ORAL | Status: DC

## 2014-10-31 MED ORDER — SODIUM CHLORIDE 0.9 % IV SOLN
10.0000 mg | INTRAVENOUS | Status: DC | PRN
  Administered 2014-10-31: 10 ug/min via INTRAVENOUS

## 2014-10-31 MED ORDER — LIDOCAINE HCL (CARDIAC) 20 MG/ML IV SOLN
INTRAVENOUS | Status: DC | PRN
  Administered 2014-10-31: 100 mg via INTRAVENOUS

## 2014-10-31 MED ORDER — ONDANSETRON HCL 4 MG/2ML IJ SOLN
4.0000 mg | Freq: Four times a day (QID) | INTRAMUSCULAR | Status: DC | PRN
  Administered 2014-11-02: 4 mg via INTRAVENOUS
  Filled 2014-10-31: qty 2

## 2014-10-31 MED ORDER — ENOXAPARIN SODIUM 40 MG/0.4ML ~~LOC~~ SOLN
40.0000 mg | Freq: Every day | SUBCUTANEOUS | Status: DC
  Administered 2014-11-01 – 2014-11-04 (×4): 40 mg via SUBCUTANEOUS
  Filled 2014-10-31 (×4): qty 0.4

## 2014-10-31 MED ORDER — MIDAZOLAM HCL 5 MG/5ML IJ SOLN
INTRAMUSCULAR | Status: DC | PRN
  Administered 2014-10-31: 1 mg via INTRAVENOUS

## 2014-10-31 MED ORDER — 0.9 % SODIUM CHLORIDE (POUR BTL) OPTIME
TOPICAL | Status: DC | PRN
  Administered 2014-10-31: 2000 mL

## 2014-10-31 MED ORDER — GLYCOPYRROLATE 0.2 MG/ML IJ SOLN
INTRAMUSCULAR | Status: AC
  Filled 2014-10-31: qty 2

## 2014-10-31 MED ORDER — ONDANSETRON HCL 4 MG/2ML IJ SOLN
INTRAMUSCULAR | Status: AC
  Filled 2014-10-31: qty 2

## 2014-10-31 MED ORDER — OXYCODONE HCL 5 MG PO TABS
5.0000 mg | ORAL_TABLET | ORAL | Status: DC | PRN
  Administered 2014-10-31: 10 mg via ORAL
  Administered 2014-11-01 – 2014-11-02 (×2): 5 mg via ORAL
  Filled 2014-10-31: qty 2
  Filled 2014-10-31 (×3): qty 1

## 2014-10-31 MED ORDER — DIPHENHYDRAMINE HCL 50 MG/ML IJ SOLN: 12.5000 mg | Freq: Four times a day (QID) | INTRAMUSCULAR | Status: DC | PRN

## 2014-10-31 MED ORDER — MORPHINE SULFATE 1 MG/ML IV SOLN
INTRAVENOUS | Status: AC
  Filled 2014-10-31: qty 25

## 2014-10-31 MED ORDER — MORPHINE SULFATE (PF) 4 MG/ML IV SOLN
INTRAVENOUS | Status: AC
  Filled 2014-10-31: qty 1

## 2014-10-31 MED ORDER — BUPIVACAINE HCL (PF) 0.5 % IJ SOLN
INTRAMUSCULAR | Status: AC
  Filled 2014-10-31: qty 10

## 2014-10-31 MED ORDER — KETOROLAC TROMETHAMINE 30 MG/ML IJ SOLN
INTRAMUSCULAR | Status: AC
  Filled 2014-10-31: qty 1

## 2014-10-31 MED ORDER — MORPHINE SULFATE 1 MG/ML IV SOLN
INTRAVENOUS | Status: DC
Start: 2014-10-31 — End: 2014-11-02
  Administered 2014-10-31: 14:00:00 via INTRAVENOUS
  Administered 2014-10-31: 3 mg via INTRAVENOUS
  Administered 2014-10-31: 10 mg via INTRAVENOUS
  Administered 2014-11-01: 06:00:00 via INTRAVENOUS
  Administered 2014-11-01: 3 mg via INTRAVENOUS
  Administered 2014-11-01: 18:00:00 via INTRAVENOUS
  Administered 2014-11-01: 17.93 mg via INTRAVENOUS
  Administered 2014-11-01: 4.5 mg via INTRAVENOUS
  Administered 2014-11-01: 6 mg via INTRAVENOUS
  Administered 2014-11-01 (×2): 4.5 mg via INTRAVENOUS
  Administered 2014-11-02: 9 mg via INTRAVENOUS
  Administered 2014-11-02: 7.5 mg via INTRAVENOUS
  Administered 2014-11-02: 1.5 mg via INTRAVENOUS
  Filled 2014-10-31 (×2): qty 25

## 2014-10-31 MED ORDER — PHENYLEPHRINE HCL 10 MG/ML IJ SOLN
INTRAMUSCULAR | Status: DC | PRN
  Administered 2014-10-31: 120 ug via INTRAVENOUS

## 2014-10-31 MED ORDER — SODIUM CHLORIDE 0.9 % IJ SOLN: 9.0000 mL | INTRAMUSCULAR | Status: DC | PRN

## 2014-10-31 MED ORDER — DIPHENHYDRAMINE HCL 12.5 MG/5ML PO ELIX
12.5000 mg | ORAL_SOLUTION | Freq: Four times a day (QID) | ORAL | Status: DC | PRN
  Filled 2014-10-31: qty 5

## 2014-10-31 MED ORDER — OXYCODONE HCL 5 MG PO TABS
ORAL_TABLET | ORAL | Status: AC
  Filled 2014-10-31: qty 2

## 2014-10-31 MED ORDER — PROPOFOL 10 MG/ML IV BOLUS
INTRAVENOUS | Status: DC | PRN
  Administered 2014-10-31: 20 mg via INTRAVENOUS
  Administered 2014-10-31: 50 mg via INTRAVENOUS
  Administered 2014-10-31: 100 mg via INTRAVENOUS
  Administered 2014-10-31: 30 mg via INTRAVENOUS

## 2014-10-31 SURGICAL SUPPLY — 68 items
ADH SKN CLS APL DERMABOND .7 (GAUZE/BANDAGES/DRESSINGS) ×1
CANISTER SUCTION 2500CC (MISCELLANEOUS) ×6 IMPLANT
CATH KIT ON Q 5IN SLV (PAIN MANAGEMENT) IMPLANT
CATH THORACIC 28FR (CATHETERS) IMPLANT
CATH THORACIC 36FR (CATHETERS) IMPLANT
CATH THORACIC 36FR RT ANG (CATHETERS) IMPLANT
CATH TROCAR 28FR (CATHETERS) ×2 IMPLANT
CLEANER TIP ELECTROSURG 2X2 (MISCELLANEOUS) ×2 IMPLANT
CONT SPEC 4OZ CLIKSEAL STRL BL (MISCELLANEOUS) ×6 IMPLANT
COVER SURGICAL LIGHT HANDLE (MISCELLANEOUS) ×4 IMPLANT
DERMABOND ADVANCED (GAUZE/BANDAGES/DRESSINGS) ×2
DERMABOND ADVANCED .7 DNX12 (GAUZE/BANDAGES/DRESSINGS) IMPLANT
DRAPE CAMERA VIDEO/LASER (DRAPES) IMPLANT
DRAPE LAPAROSCOPIC ABDOMINAL (DRAPES) ×3 IMPLANT
DRAPE WARM FLUID 44X44 (DRAPE) ×3 IMPLANT
ELECT REM PT RETURN 9FT ADLT (ELECTROSURGICAL) ×3
ELECTRODE REM PT RTRN 9FT ADLT (ELECTROSURGICAL) ×1 IMPLANT
GAUZE SPONGE 4X4 12PLY STRL (GAUZE/BANDAGES/DRESSINGS) ×1 IMPLANT
GLOVE BIOGEL PI IND STRL 6.5 (GLOVE) IMPLANT
GLOVE BIOGEL PI IND STRL 7.0 (GLOVE) IMPLANT
GLOVE BIOGEL PI INDICATOR 6.5 (GLOVE) ×4
GLOVE BIOGEL PI INDICATOR 7.0 (GLOVE) ×6
GLOVE EUDERMIC 7 POWDERFREE (GLOVE) ×6 IMPLANT
GLOVE SURG SS PI 7.0 STRL IVOR (GLOVE) ×8 IMPLANT
GOWN STRL REUS W/ TWL LRG LVL3 (GOWN DISPOSABLE) ×2 IMPLANT
GOWN STRL REUS W/ TWL XL LVL3 (GOWN DISPOSABLE) ×1 IMPLANT
GOWN STRL REUS W/TWL LRG LVL3 (GOWN DISPOSABLE) ×9
GOWN STRL REUS W/TWL XL LVL3 (GOWN DISPOSABLE) ×9
HANDLE STAPLE ENDO GIA SHORT (STAPLE) ×2
KIT BASIN OR (CUSTOM PROCEDURE TRAY) ×3 IMPLANT
KIT ROOM TURNOVER OR (KITS) ×3 IMPLANT
NS IRRIG 1000ML POUR BTL (IV SOLUTION) ×6 IMPLANT
PACK CHEST (CUSTOM PROCEDURE TRAY) ×3 IMPLANT
PAD ARMBOARD 7.5X6 YLW CONV (MISCELLANEOUS) ×6 IMPLANT
RELOAD EGIA 60 MED/THCK PURPLE (STAPLE) ×3 IMPLANT
RELOAD STAPLE 60 MED/THCK ART (STAPLE) IMPLANT
SEALANT SURG COSEAL 4ML (VASCULAR PRODUCTS) IMPLANT
SEALANT SURG COSEAL 8ML (VASCULAR PRODUCTS) IMPLANT
SOLUTION ANTI FOG 6CC (MISCELLANEOUS) ×2 IMPLANT
SPECIMEN JAR MEDIUM (MISCELLANEOUS) ×3 IMPLANT
SPONGE GAUZE 4X4 12PLY STER LF (GAUZE/BANDAGES/DRESSINGS) ×2 IMPLANT
STAPLER ENDO GIA 12 SHRT THIN (STAPLE) IMPLANT
STAPLER ENDO GIA 12MM SHORT (STAPLE) ×1 IMPLANT
SUT PROLENE 3 0 SH DA (SUTURE) IMPLANT
SUT PROLENE 4 0 RB 1 (SUTURE)
SUT PROLENE 4-0 RB1 .5 CRCL 36 (SUTURE) IMPLANT
SUT SILK  1 MH (SUTURE) ×4
SUT SILK 1 MH (SUTURE) IMPLANT
SUT SILK 2 0SH CR/8 30 (SUTURE) IMPLANT
SUT VIC AB 1 CTX 36 (SUTURE)
SUT VIC AB 1 CTX36XBRD ANBCTR (SUTURE) IMPLANT
SUT VIC AB 2 TP1 27 (SUTURE) IMPLANT
SUT VIC AB 2-0 CT1 27 (SUTURE)
SUT VIC AB 2-0 CT1 TAPERPNT 27 (SUTURE) IMPLANT
SUT VIC AB 2-0 CTX 36 (SUTURE) IMPLANT
SUT VIC AB 2-0 UR6 27 (SUTURE) ×2 IMPLANT
SUT VIC AB 3-0 MH 27 (SUTURE) IMPLANT
SUT VIC AB 3-0 SH 27 (SUTURE)
SUT VIC AB 3-0 SH 27X BRD (SUTURE) IMPLANT
SUT VIC AB 3-0 X1 27 (SUTURE) ×2 IMPLANT
SYSTEM SAHARA CHEST DRAIN ATS (WOUND CARE) ×3 IMPLANT
TAPE CLOTH SURG 4X10 WHT LF (GAUZE/BANDAGES/DRESSINGS) ×2 IMPLANT
TIP APPLICATOR SPRAY EXTEND 16 (VASCULAR PRODUCTS) IMPLANT
TOWEL OR 17X24 6PK STRL BLUE (TOWEL DISPOSABLE) ×3 IMPLANT
TOWEL OR 17X26 10 PK STRL BLUE (TOWEL DISPOSABLE) ×3 IMPLANT
TRAP SPECIMEN MUCOUS 40CC (MISCELLANEOUS) IMPLANT
TRAY FOLEY CATH 14FRSI W/METER (CATHETERS) ×3 IMPLANT
WATER STERILE IRR 1000ML POUR (IV SOLUTION) ×4 IMPLANT

## 2014-10-31 NOTE — Anesthesia Procedure Notes (Signed)
Procedure Name: Intubation Date/Time: 10/31/2014 10:49 AM Performed by: Quentin Ore Pre-anesthesia Checklist: Patient identified, Emergency Drugs available, Suction available, Patient being monitored and Timeout performed Patient Re-evaluated:Patient Re-evaluated prior to inductionOxygen Delivery Method: Circle system utilized Preoxygenation: Pre-oxygenation with 100% oxygen Intubation Type: IV induction Ventilation: Mask ventilation without difficulty Laryngoscope Size: Mac and 4 Grade View: Grade I Endobronchial tube: Left, Double lumen EBT, EBT position confirmed by auscultation and EBT position confirmed by fiberoptic bronchoscope and 37 Fr Number of attempts: 1 Airway Equipment and Method: Stylet Placement Confirmation: ETT inserted through vocal cords under direct vision,  positive ETCO2 and breath sounds checked- equal and bilateral Secured at: 29 cm Tube secured with: Tape Dental Injury: Teeth and Oropharynx as per pre-operative assessment

## 2014-10-31 NOTE — Progress Notes (Signed)
UR Completed. Samantha Claxton, RN, BSN.  [phone] 

## 2014-10-31 NOTE — Brief Op Note (Signed)
10/26/2014 - 10/31/2014  12:41 PM  PATIENT:  Porfirio Mylar Wolford  64 y.o. female  PRE-OPERATIVE DIAGNOSIS:  LEFT spontaneous pneumothorax with persistent air leak   POST-OPERATIVE DIAGNOSIS:  Same  PROCEDURE:  Procedure(s): LEFT VIDEO ASSISTED THORACOSCOPY (Left) STAPLING OF BLEBS (Left)  Mechanical pleurodesis  SURGEON:  Surgeon(s) and Role:    * Alleen Borne, MD - Primary  PHYSICIAN ASSISTANT: Doree Fudge, PA-C    ANESTHESIA:   general  EBL:  Total I/O In: 0  Out: 100 [Urine:100]  BLOOD ADMINISTERED:none  DRAINS: 28 F  Chest Tube(s) in the left pleural space   LOCAL MEDICATIONS USED:  NONE  SPECIMEN:  No Specimen  DISPOSITION OF SPECIMEN:  N/A  COUNTS:  YES  TOURNIQUET:  * No tourniquets in log *  DICTATION: .Note written in EPIC  PLAN OF CARE: Admit to inpatient   PATIENT DISPOSITION:  PACU - hemodynamically stable.   Delay start of Pharmacological VTE agent (>24hrs) due to surgical blood loss or risk of bleeding: yes

## 2014-10-31 NOTE — Op Note (Signed)
CARDIOTHORACIC SURGERY OPERATIVE NOTE:  [name] [name] [id number] 10/31/2014   Preoperative Dx:  Spontaneous left pneumothorax with persistent air leak  Postoperative Dx: same   Procedure: Left video-assisted thoracoscopy, stapling of apical bleb, mechanical pleurodesis  Surgeon: Dr. Alleen Borne   Assistant: Doree Fudge, PA-c  Anesthesia: GET   Clinical History:   The patient is a 64 year old 1ppd smoker who presented with a spontaneous left ptx last Thursday evening causing significant shortness of breath. She has a left chest tube inserted and it appeared that the air leak had stopped on Friday morning. Then on Saturday she was noted to have bubbles from the chest tube suggesting a recurrent air leak. CT scan showed a small basilar and apical ptx with diffuse changes of emphysema. There did not appear to be a dominant bleb. After 4 days the air leak persisted and I felt that it would be best to perform VATS to look for an air leak and mechanical pleurodesis. I discussed the procedure with her including alternatives, benefits and risks including but not limited to bleeding, infection, persistent or recurrent air leak and she understands and agrees to proceed.  Operative procedure:   The patient was seen in the preoperative holding area. The proper patient, proper operative side, proper operation were confirmed after reviewing his history and chest x-ray. The left side of the chest was signed by me. Preoperative intravenous antibiotics were given. He was taken back to the operating room and placed on table in the supine position. After induction of general endotracheal anesthesia using a double-lumen tube, a Foley catheter was placed in the bladder using sterile technique. Lower extremity sequential compression devices were used. The patient was positioned in the right lateral decubitus position with the left side up. The previously placed chest tube was removed. The left side of the  chest was prepped with Betadine soap and solution and draped in the usual sterile manner. A timeout was taken and the proper patient, proper operation, and proper operative side were confirmed with nursing and anesthesia staff. A 1 cm incision was made in the midaxillary line at about the eighth intercostal space. The left pleural space was entered bluntly with a hemostat and an 8 mm trocar was inserted. The 30 thoracoscope was inserted and the pleural space inspected. There was diffuse emphysematous changes noted on the surface of both lobes of the lung. In the apex there was a bleb that looked like it had been leaking recently with adhesion to the parietal pleura. This bleb was stapled. There were no other dominant blebs noted. Therefore a complete mechanical pleurodesis was performed using a piece of an abrasive pad placed in a long clamp. Then one 28 French chest tube was placed. The lung was reinflated under direct vision and I did not see any air leak. Hemostasis appeared adequate. The posterior axillary and anterior axillary incisions were then closed in layers using a 2-0 Vicryl subcutaneous suture and 3-0 Vicryl subcuticular skin closure.  The chest tubes were connected to Pleur-evac suction. The sponge needle and instrument counts were correct according to the scrub nurse. The patient was then turned into the supine position, extubated, and transported to the post anesthesia care unit in satisfactory and stable condition.

## 2014-10-31 NOTE — Brief Op Note (Signed)
10/26/2014 - 10/31/2014  12:48 PM  PATIENT:  [name] [name]  64 y.o. female  PRE-OPERATIVE DIAGNOSIS:  1. Left spontaneous pneumothorax 2. Persistent air leak 3. History of tobacco abuse  POST-OPERATIVE DIAGNOSIS:   1. Left spontaneous pneumothorax 2. Persistent air leak 3. History of tobacco abuse  PROCEDURE:  LEFT VIDEO ASSISTED THORACOSCOPY  STAPLING OF BLEBS (Left), MECHANICAL PLEURODESIS  SURGEON:  Surgeon(s) and Role:    * Alleen Borne, MD - Primary  PHYSICIAN ASSISTANT: Doree Fudge PA-C  ANESTHESIA:   general  EBL:  Total I/O In: 1000 [I.V.:1000] Out: 100 [Urine:100]  BLOOD ADMINISTERED:none  DRAINS: 28 French chest tube placed in the left pleural space   COUNTS CORRECT:  YES  DICTATION: .Dragon Dictation  PLAN OF CARE: Admit to inpatient   PATIENT DISPOSITION:  PACU - hemodynamically stable.   Delay start of Pharmacological VTE agent (>24hrs) due to surgical blood loss or risk of bleeding: yes

## 2014-10-31 NOTE — Anesthesia Preprocedure Evaluation (Signed)
Anesthesia Evaluation  Patient identified by MRN, date of birth, ID band Patient awake    Reviewed: Allergy & Precautions, NPO status , Patient's Chart, lab work & pertinent test results  Airway Mallampati: II  TM Distance: >3 FB Neck ROM: Full    Dental no notable dental hx.    Pulmonary COPD, Current Smoker,    Pulmonary exam normal breath sounds clear to auscultation       Cardiovascular negative cardio ROS Normal cardiovascular exam Rhythm:Regular Rate:Normal     Neuro/Psych negative neurological ROS  negative psych ROS   GI/Hepatic negative GI ROS, Neg liver ROS,   Endo/Other  negative endocrine ROS  Renal/GU negative Renal ROS     Musculoskeletal negative musculoskeletal ROS (+)   Abdominal   Peds  Hematology negative hematology ROS (+)   Anesthesia Other Findings   Reproductive/Obstetrics                             Anesthesia Physical Anesthesia Plan  ASA: III  Anesthesia Plan: General   Post-op Pain Management:    Induction: Intravenous  Airway Management Planned: Double Lumen EBT and Oral ETT  Additional Equipment: Arterial line  Intra-op Plan:   Post-operative Plan: Extubation in OR  Informed Consent: I have reviewed the patients History and Physical, chart, labs and discussed the procedure including the risks, benefits and alternatives for the proposed anesthesia with the patient or authorized representative who has indicated his/her understanding and acceptance.   Dental advisory given  Plan Discussed with: CRNA  Anesthesia Plan Comments: (2 x PIV)        Anesthesia Quick Evaluation

## 2014-10-31 NOTE — Transfer of Care (Signed)
Immediate Anesthesia Transfer of Care Note  Patient: [name] [name]  Procedure(s) Performed: Procedure(s): LEFT VIDEO ASSISTED THORACOSCOPY (Left) STAPLING OF BLEBS (Left)  Patient Location: PACU  Anesthesia Type:General  Level of Consciousness: awake, alert  and oriented  Airway & Oxygen Therapy: Patient Spontanous Breathing and Patient connected to face mask oxygen  Post-op Assessment: Report given to RN, Post -op Vital signs reviewed and stable and Patient moving all extremities X 4  Post vital signs: Reviewed and stable  Last Vitals:  Filed Vitals:   10/31/14 0431  BP: 119/73  Pulse: 77  Temp: 36.6 C  Resp: 18    Complications: No apparent anesthesia complications

## 2014-10-31 NOTE — Progress Notes (Signed)
Report given to mark brande rn as cargiver

## 2014-11-01 ENCOUNTER — Encounter (HOSPITAL_COMMUNITY): Payer: Self-pay | Admitting: Surgery

## 2014-11-01 ENCOUNTER — Inpatient Hospital Stay (HOSPITAL_COMMUNITY): Payer: 59

## 2014-11-01 LAB — CBC
HCT: 38 % (ref 36.0–46.0)
Hemoglobin: 12.5 g/dL (ref 12.0–15.0)
MCH: 28.4 pg (ref 26.0–34.0)
MCHC: 32.9 g/dL (ref 30.0–36.0)
MCV: 86.4 fL (ref 78.0–100.0)
PLATELETS: 206 10*3/uL (ref 150–400)
RBC: 4.4 MIL/uL (ref 3.87–5.11)
RDW: 13.1 % (ref 11.5–15.5)
WBC: 8.4 10*3/uL (ref 4.0–10.5)

## 2014-11-01 LAB — BASIC METABOLIC PANEL
ANION GAP: 6 (ref 5–15)
BUN: 16 mg/dL (ref 6–20)
CALCIUM: 8 mg/dL — AB (ref 8.9–10.3)
CO2: 28 mmol/L (ref 22–32)
Chloride: 100 mmol/L — ABNORMAL LOW (ref 101–111)
Creatinine, Ser: 0.78 mg/dL (ref 0.44–1.00)
GLUCOSE: 104 mg/dL — AB (ref 65–99)
Potassium: 4.1 mmol/L (ref 3.5–5.1)
Sodium: 134 mmol/L — ABNORMAL LOW (ref 135–145)

## 2014-11-01 NOTE — Progress Notes (Addendum)
      [address]       [place] [address]             [phone]      1 Day Post-Op Procedure(s) (LRB): LEFT VIDEO ASSISTED THORACOSCOPY (Left) STAPLING OF BLEBS (Left)   Subjective:  Patient resting comfortably.  States pain is controlled, no nausea or vomiting  Objective: Vital signs in last 24 hours: Temp:  [96.9 F (36.1 C)-98.8 F (37.1 C)] 97.8 F (36.6 C) (09/28 0700) Pulse Rate:  [71-95] 81 (09/28 0600) Cardiac Rhythm:  [-] Normal sinus rhythm (09/27 2011) Resp:  [12-24] 17 (09/28 0600) BP: (85-142)/(55-82) 95/64 mmHg (09/28 0600) SpO2:  [93 %-99 %] 94 % (09/28 0600) Arterial Line BP: (73-170)/(44-76) 114/55 mmHg (09/28 0600) FiO2 (%):  [100 %] 100 % (09/27 1846)  Intake/Output from previous day: 09/27 0701 - 09/28 0700 In: 2136.3 [P.O.:240; I.V.:1846.3; IV Piggyback:50] Out: 1112 [Urine:860; Blood:50; Chest Tube:202]  General appearance: alert, cooperative and no distress Heart: regular rate and rhythm Lungs: clear to auscultation bilaterally Abdomen: soft, non-tender; bowel sounds normal; no masses,  no organomegaly Wound: clean and dry  Lab Results:  Recent Labs  11/01/14 0410  WBC 8.4  HGB 12.5  HCT 38.0  PLT 206   BMET:  Recent Labs  11/01/14 0410  NA 134*  K 4.1  CL 100*  CO2 28  GLUCOSE 104*  BUN 16  CREATININE 0.78  CALCIUM 8.0*    PT/INR: No results for input(s): LABPROT, INR in the last 72 hours. ABG No results found for: PHART, HCO3, TCO2, ACIDBASEDEF, O2SAT CBG (last 3)  No results for input(s): GLUCAP in the last 72 hours.  Assessment/Plan: S/P Procedure(s) (LRB): LEFT VIDEO ASSISTED THORACOSCOPY (Left) STAPLING OF BLEBS (Left)  1. Chest tube- no air leak appreciated, CXR without pneumothorax- will leave chest tube on suction today 2. Pulm- wean oxygen as tolerated, increased atelectasis, will add flutter valve 3. Decrease IV Fluid, advance diet as tolerated 4. D/C Arterial line 5. Dispo- patient  stable, CT to suction today, likely to water seal tomorrow, repeat CXR in AM  LOS: 6 days    Lowella Dandy 11/01/2014   Chart reviewed, patient examined, agree with above. No air leak and CXR looks ok. Will put to water seal today and possibly remove tube tomorrow.

## 2014-11-01 NOTE — Progress Notes (Signed)
UR COMPLETED  

## 2014-11-01 NOTE — Anesthesia Postprocedure Evaluation (Signed)
Anesthesia Post Note  Patient: [name] [name]  Procedure(s) Performed: Procedure(s) (LRB): LEFT VIDEO ASSISTED THORACOSCOPY (Left) STAPLING OF BLEBS (Left)  Anesthesia type: General  Patient location: PACU  Post pain: Pain level controlled  Post assessment: Post-op Vital signs reviewed  Last Vitals: BP 94/63 mmHg  Pulse 72  Temp(Src) 36.6 C (Oral)  Resp 15  Ht  (1.651 m)  Wt 177 lb 1.6 oz (80.332 kg)  BMI 29.47 kg/m2  SpO2 96%  Post vital signs: Reviewed  Level of consciousness: sedated  Complications: No apparent anesthesia complications

## 2014-11-01 NOTE — Progress Notes (Signed)
RT instructed pt on proper use of flutter valve. Pt was able to demonstrate back proper technique.  RT will continue to monitor. 

## 2014-11-02 ENCOUNTER — Inpatient Hospital Stay (HOSPITAL_COMMUNITY): Payer: 59

## 2014-11-02 LAB — GLUCOSE, CAPILLARY: GLUCOSE-CAPILLARY: 113 mg/dL — AB (ref 65–99)

## 2014-11-02 MED ORDER — MORPHINE SULFATE (PF) 4 MG/ML IV SOLN
4.0000 mg | Freq: Once | INTRAVENOUS | Status: AC
  Administered 2014-11-02: 4 mg via INTRAVENOUS

## 2014-11-02 MED ORDER — MORPHINE SULFATE (PF) 4 MG/ML IV SOLN
INTRAVENOUS | Status: AC
  Administered 2014-11-02: 09:00:00
  Filled 2014-11-02: qty 1

## 2014-11-02 NOTE — Progress Notes (Signed)
Dc'ed PCA per order. No morphine left in PCA syringe to waste. Tereasa Coop, RN in room to witness. Per MD order,  gave  IV morphine and dc'ed chest tube. Patient tolerated procedure well and VSS. Patient on 2L o2 sats are 94Percent. Patient left in bed with call bell in reach.Will continue to monitor.

## 2014-11-02 NOTE — Progress Notes (Signed)
Transfer report received from 3S at 1132 and pt arrived to the unit via wheelchair with belongings and family at side at 1230. Pt oriented to the unit and room; telemetry applied and verified; VSS; left incision dsg clean dry and intact; pt sitting up in bed with family at bedside and call light within reach. Will closely monitor. Arabella Merles Kuffour RN.

## 2014-11-02 NOTE — Progress Notes (Signed)
Called report to receiving RN on 2 west. Family at bedside with patient belongings. Patient transferring to 2W04 via wheelchair.

## 2014-11-02 NOTE — Progress Notes (Signed)
2 Days Post-Op Procedure(s) (LRB): LEFT VIDEO ASSISTED THORACOSCOPY (Left) STAPLING OF BLEBS (Left) Subjective:  Complains of left sided chest pain along anterior upper chest, some shortness of breath with activity.  Objective: Vital signs in last 24 hours: Temp:  [97.6 F (36.4 C)-98.3 F (36.8 C)] 97.8 F (36.6 C) (09/29 0700) Pulse Rate:  [76-90] 76 (09/29 0312) Cardiac Rhythm:  [-] Normal sinus rhythm (09/29 0312) Resp:  [13-21] 16 (09/29 0800) BP: (96-138)/(54-78) 101/73 mmHg (09/29 0312) SpO2:  [92 %-99 %] 99 % (09/29 0800) Arterial Line BP: (107)/(92) 107/92 mmHg (09/28 1200)  Hemodynamic parameters for last 24 hours:    Intake/Output from previous day: 09/28 0701 - 09/29 0700 In: 1373.3 [P.O.:240; I.V.:1133.3] Out: 2230 [Urine:2200; Chest Tube:30] Intake/Output this shift:    General appearance: alert and cooperative Heart: regular rate and rhythm, S1, S2 normal, no murmur, click, rub or gallop Lungs: diminished breath sounds LLL Wound: dressings dry no air leak from chest tube.  Lab Results:  Recent Labs  11/01/14 0410  WBC 8.4  HGB 12.5  HCT 38.0  PLT 206   BMET:  Recent Labs  11/01/14 0410  NA 134*  K 4.1  CL 100*  CO2 28  GLUCOSE 104*  BUN 16  CREATININE 0.78  CALCIUM 8.0*    PT/INR: No results for input(s): LABPROT, INR in the last 72 hours. ABG No results found for: PHART, HCO3, TCO2, ACIDBASEDEF, O2SAT CBG (last 3)  No results for input(s): GLUCAP in the last 72 hours.  CLINICAL DATA: History of left pneumothorax. Chest tube in place.  EXAM: PORTABLE CHEST 1 VIEW  COMPARISON: Seen view of the chest 11/01/2014 and 10/31/2014.  FINDINGS: Left chest tube remains in place. No pneumothorax is identified. Pleural and parenchymal opacities in the left mid and lower lung zones have progressed. Right basilar atelectasis is noted  IMPRESSION: Negative for pneumothorax with a left chest tube in place.  Increasing airspace  disease in the left mid and lower lung zones could be due to atelectasis or pneumonia.   Electronically Signed  By: Drusilla Kanner M.D.  On: 11/02/2014 07:47   Assessment/Plan: S/P Procedure(s) (LRB): LEFT VIDEO ASSISTED THORACOSCOPY (Left) STAPLING OF BLEBS (Left)  Stable.  Remove chest tube and DC PCA. Her chest pain most likely related to pleurodesis and chest tube. Continue IS, ambulation, oral pain meds. Transfer to 2W. CXR in am. Home tomorrow if she feels ok and is off oxygen.   LOS: 7 days    Alleen Borne 11/02/2014

## 2014-11-03 ENCOUNTER — Inpatient Hospital Stay (HOSPITAL_COMMUNITY): Payer: 59

## 2014-11-03 MED ORDER — BUDESONIDE-FORMOTEROL FUMARATE 80-4.5 MCG/ACT IN AERO
2.0000 | INHALATION_SPRAY | Freq: Two times a day (BID) | RESPIRATORY_TRACT | Status: DC
  Administered 2014-11-03 – 2014-11-04 (×2): 2 via RESPIRATORY_TRACT
  Filled 2014-11-03: qty 6.9

## 2014-11-03 MED ORDER — IPRATROPIUM-ALBUTEROL 0.5-2.5 (3) MG/3ML IN SOLN
3.0000 mL | Freq: Four times a day (QID) | RESPIRATORY_TRACT | Status: DC
  Administered 2014-11-03 – 2014-11-04 (×5): 3 mL via RESPIRATORY_TRACT
  Filled 2014-11-03 (×5): qty 3

## 2014-11-03 NOTE — Care Management Note (Addendum)
Case Management Note  Patient Details  Name: [name] [name] MRN: [id number] Date of Birth: 22-Oct-1950  Subjective/Objective:      Pt admitted with spontaneous pneumothorax, had VATS and bleb stapling performed 10/31/14              Action/Plan:  Pt is independent from home with son   Expected Discharge Date:                  Expected Discharge Plan:  Home/Self Care (independent from home with son)  In-House Referral:     Discharge planning Services  CM Consult  Post Acute Care Choice:    Choice offered to:     DME Arranged:    DME Agency:     HH Arranged:    HH Agency:     Status of Service:  In process, will continue to follow  Medicare Important Message Given:    Date Medicare IM Given:    Medicare IM give by:    Date Additional Medicare IM Given:    Additional Medicare Important Message give by:     If discussed at Long Length of Stay Meetings, dates discussed:    Additional Comments: Pt returned to 2W 11/02/14 from stepdown ICU post VATS procedure with bleb stapling.  CM will continue to monitor for disposition needs. Cherylann Parr, RN 11/03/2014, 9:22 AM

## 2014-11-03 NOTE — Progress Notes (Signed)
UR Completed. Samantha Claxton, RN, BSN.  [phone] 

## 2014-11-03 NOTE — Progress Notes (Addendum)
      [address]       [name] [address]             [phone]      3 Days Post-Op Procedure(s) (LRB): LEFT VIDEO ASSISTED THORACOSCOPY (Left) STAPLING OF BLEBS (Left)   Subjective:  [name] [name] feels short of breath this morning.  She states this is worse than yesterday.  She states she wants to go home  Objective: Vital signs in last 24 hours: Temp:  [97.9 F (36.6 C)-98.4 F (36.9 C)] 98 F (36.7 C) (09/30 0503) Pulse Rate:  [84-102] 94 (09/30 0503) Cardiac Rhythm:  [-] Normal sinus rhythm (09/30 0700) Resp:  [18-31] 18 (09/30 0503) BP: (109-126)/(60-81) 109/61 mmHg (09/30 0503) SpO2:  [89 %-97 %] 91 % (09/30 0503)  Intake/Output from previous day: 09/29 0701 - 09/30 0700 In: -  Out: 470 [Urine:450; Chest Tube:20]  General appearance: alert, cooperative and no distress Heart: regular rate and rhythm Lungs: clear to auscultation bilaterally Abdomen: soft, non-tender; bowel sounds normal; no masses,  no organomegaly Wound: clean and dry  Lab Results:  Recent Labs  11/01/14 0410  WBC 8.4  HGB 12.5  HCT 38.0  PLT 206   BMET:  Recent Labs  11/01/14 0410  NA 134*  K 4.1  CL 100*  CO2 28  GLUCOSE 104*  BUN 16  CREATININE 0.78  CALCIUM 8.0*    PT/INR: No results for input(s): LABPROT, INR in the last 72 hours. ABG No results found for: PHART, HCO3, TCO2, ACIDBASEDEF, O2SAT CBG (last 3)   Recent Labs  11/02/14 1137  GLUCAP 113*    Assessment/Plan: S/P Procedure(s) (LRB): LEFT VIDEO ASSISTED THORACOSCOPY (Left) STAPLING OF BLEBS (Left)  1. CV- hemodynamically 2. Pulm- weaning oxygen as tolerated, CXR shows improvement of lung aeration, some pleural fluid 3. Dispo- patient stable, continue to wean oxygen, patient visibly winded during exam, not ready for d/c today   LOS: 8 days    [name] [name] 11/03/2014   Chart reviewed, patient examined, agree with above. She has improved aeration on CXR but still has left base  atelectasis and complains of shortness of breath with activity. She is still on oxygen. She can certainly talk non-stop without noticeable dyspnea. I think she needs another day to work on IS, flutter valve and ambulation. She is a long time heavy smoker with significant COPD and uses inhalers and nebs at home.

## 2014-11-03 NOTE — Progress Notes (Signed)
SATURATION QUALIFICATIONS: (This note is used to comply with regulatory documentation for home oxygen)  Patient Saturations on Room Air at Rest = 93%  Patient Saturations on Room Air while Ambulating = 84%  Patient Saturations on 3 Liters of oxygen while Ambulating = 95%  Please briefly explain why patient needs home oxygen: pt oxygen dropped to the 80's on air; pt placed on 2L oxygen and it went up to 92% but dropped down to 88% during ambulation; oxygen increased to 3L and O2 came up to 95% and stayed in the 90's during ambulation. Pt ambulated 578ft in hallway independently with minimal supervision. Arabella Merles Kuffour RN.

## 2014-11-04 ENCOUNTER — Inpatient Hospital Stay (HOSPITAL_COMMUNITY): Payer: 59

## 2014-11-04 MED ORDER — INFLUENZA VAC SPLIT QUAD 0.5 ML IM SUSY
0.5000 mL | PREFILLED_SYRINGE | Freq: Once | INTRAMUSCULAR | Status: AC
  Administered 2014-11-04: 0.5 mL via INTRAMUSCULAR
  Filled 2014-11-04: qty 0.5

## 2014-11-04 MED ORDER — FUROSEMIDE 10 MG/ML IJ SOLN
40.0000 mg | Freq: Once | INTRAMUSCULAR | Status: AC
  Administered 2014-11-04: 40 mg via INTRAVENOUS
  Filled 2014-11-04: qty 4

## 2014-11-04 MED ORDER — PNEUMOCOCCAL VAC POLYVALENT 25 MCG/0.5ML IJ INJ
0.5000 mL | INJECTION | Freq: Once | INTRAMUSCULAR | Status: AC
  Administered 2014-11-04: 0.5 mL via INTRAMUSCULAR
  Filled 2014-11-04: qty 0.5

## 2014-11-04 MED ORDER — OXYCODONE HCL 5 MG PO TABS: 5.0000 mg | ORAL_TABLET | ORAL | Status: DC | PRN

## 2014-11-04 NOTE — Progress Notes (Signed)
      [address]       [place] [address]             [phone]      4 Days Post-Op Procedure(s) (LRB): LEFT VIDEO ASSISTED THORACOSCOPY (Left) STAPLING OF BLEBS (Left)   Subjective:  [name] [name] states she feels like she is breathing better.  She is adamant that she wants oxygen for home use.  I tried to explain to her that she may not qualify and she was yelling that she doesn't care.  Objective: Vital signs in last 24 hours: Temp:  [98.3 F (36.8 C)-98.5 F (36.9 C)] 98.3 F (36.8 C) (10/01 0417) Pulse Rate:  [89-100] 89 (10/01 0417) Cardiac Rhythm:  [-] Normal sinus rhythm (10/01 0700) Resp:  [17-22] 18 (10/01 0417) BP: (116-126)/(62-97) 126/67 mmHg (10/01 0417) SpO2:  [90 %-100 %] 90 % (10/01 0417)  Intake/Output from previous day: 09/30 0701 - 10/01 0700 In: 240 [P.O.:240] Out: -   General appearance: alert, cooperative and no distress Heart: regular rate and rhythm Lungs: clear to auscultation bilaterally Abdomen: soft, non-tender; bowel sounds normal; no masses,  no organomegaly Wound: clean and dry  Lab Results: No results for input(s): WBC, HGB, HCT, PLT in the last 72 hours. BMET: No results for input(s): NA, K, CL, CO2, GLUCOSE, BUN, CREATININE, CALCIUM in the last 72 hours.  PT/INR: No results for input(s): LABPROT, INR in the last 72 hours. ABG No results found for: PHART, HCO3, TCO2, ACIDBASEDEF, O2SAT CBG (last 3)   Recent Labs  11/02/14 1137  GLUCAP 113*    Assessment/Plan: S/P Procedure(s) (LRB): LEFT VIDEO ASSISTED THORACOSCOPY (Left) STAPLING OF BLEBS (Left)  1. CV-hemodynamically stable 2. Pulm- severe COPD, oxygen sats are 94% on RA, however patient continues to wear oxygen and is insistent she needs this for discharge... CXR with small left pleural effusion, atelectasis 3. Dispo- patient stable, will obtain oxygen, saturations, can try Lasix for effusion, continue current care   LOS: 9 days    [name] [name],  [name] [name] 11/04/2014

## 2014-11-04 NOTE — Progress Notes (Signed)
Patient sitting on side of bed on room air with saturations of 96-97%. Patient ambulated to hallway and back to bedside approximately 25 feet with oxygen saturations 92-95% on room air. Patient saturation quickly returned to 96% on room air.  Patient short of breath and having difficulty speaking. Patient requested to put oxygen back on oxygen. I explained that saturations were good and oxygen not needed at this time.  Respiratory in room to give breathing treatment. Pt resting with call bell within reach.  Will continue to monitor. Thomas Hoff, RN

## 2014-11-08 ENCOUNTER — Emergency Department (HOSPITAL_COMMUNITY)
Admission: EM | Admit: 2014-11-08 | Discharge: 2014-11-08 | Disposition: A | Discharge status: Home / Self Care | Payer: 59 | Attending: Emergency Medicine | Admitting: Emergency Medicine

## 2014-11-08 ENCOUNTER — Emergency Department (HOSPITAL_COMMUNITY): Payer: 59

## 2014-11-08 ENCOUNTER — Encounter (HOSPITAL_COMMUNITY): Payer: Self-pay | Admitting: Emergency Medicine

## 2014-11-08 DIAGNOSIS — Z720 Tobacco use: Secondary | ICD-10-CM | POA: Diagnosis not present

## 2014-11-08 DIAGNOSIS — Z79899 Other long term (current) drug therapy: Secondary | ICD-10-CM | POA: Diagnosis not present

## 2014-11-08 DIAGNOSIS — G8918 Other acute postprocedural pain: Secondary | ICD-10-CM | POA: Insufficient documentation

## 2014-11-08 DIAGNOSIS — J449 Chronic obstructive pulmonary disease, unspecified: Secondary | ICD-10-CM | POA: Diagnosis not present

## 2014-11-08 LAB — CBC WITH DIFFERENTIAL/PLATELET
BASOS PCT: 1 %
Basophils Absolute: 0.1 10*3/uL (ref 0.0–0.1)
EOS ABS: 0.5 10*3/uL (ref 0.0–0.7)
EOS PCT: 5 %
HCT: 40.2 % (ref 36.0–46.0)
Hemoglobin: 13.3 g/dL (ref 12.0–15.0)
LYMPHS ABS: 1.9 10*3/uL (ref 0.7–4.0)
Lymphocytes Relative: 22 %
MCH: 28.3 pg (ref 26.0–34.0)
MCHC: 33.1 g/dL (ref 30.0–36.0)
MCV: 85.5 fL (ref 78.0–100.0)
Monocytes Absolute: 0.5 10*3/uL (ref 0.1–1.0)
Monocytes Relative: 6 %
Neutro Abs: 5.9 10*3/uL (ref 1.7–7.7)
Neutrophils Relative %: 66 %
PLATELETS: 361 10*3/uL (ref 150–400)
RBC: 4.7 MIL/uL (ref 3.87–5.11)
RDW: 13.2 % (ref 11.5–15.5)
WBC: 8.8 10*3/uL (ref 4.0–10.5)

## 2014-11-08 LAB — I-STAT CHEM 8, ED
BUN: 21 mg/dL — ABNORMAL HIGH (ref 6–20)
CALCIUM ION: 1.16 mmol/L (ref 1.13–1.30)
CHLORIDE: 104 mmol/L (ref 101–111)
Creatinine, Ser: 0.9 mg/dL (ref 0.44–1.00)
Glucose, Bld: 107 mg/dL — ABNORMAL HIGH (ref 65–99)
HCT: 43 % (ref 36.0–46.0)
Hemoglobin: 14.6 g/dL (ref 12.0–15.0)
Potassium: 4.4 mmol/L (ref 3.5–5.1)
SODIUM: 138 mmol/L (ref 135–145)
TCO2: 22 mmol/L (ref 0–100)

## 2014-11-08 MED ORDER — VANCOMYCIN HCL 10 G IV SOLR
1500.0000 mg | Freq: Once | INTRAVENOUS | Status: DC
  Filled 2014-11-08: qty 1500

## 2014-11-08 MED ORDER — TRAMADOL HCL 50 MG PO TABS: 50.0000 mg | ORAL_TABLET | Freq: Four times a day (QID) | ORAL | Status: DC | PRN

## 2014-11-08 MED ORDER — IOHEXOL 300 MG/ML  SOLN
75.0000 mL | Freq: Once | INTRAMUSCULAR | Status: AC | PRN
  Administered 2014-11-08: 75 mL via INTRAVENOUS

## 2014-11-08 MED ORDER — IPRATROPIUM-ALBUTEROL 0.5-2.5 (3) MG/3ML IN SOLN
3.0000 mL | RESPIRATORY_TRACT | Status: DC
  Administered 2014-11-08: 3 mL via RESPIRATORY_TRACT
  Filled 2014-11-08: qty 3

## 2014-11-08 MED ORDER — BUDESONIDE-FORMOTEROL FUMARATE 80-4.5 MCG/ACT IN AERO
2.0000 | INHALATION_SPRAY | Freq: Once | RESPIRATORY_TRACT | Status: AC
  Administered 2014-11-08: 2 via RESPIRATORY_TRACT
  Filled 2014-11-08: qty 6.9

## 2014-11-08 MED ORDER — PIPERACILLIN-TAZOBACTAM 3.375 G IVPB 30 MIN
3.3750 g | Freq: Once | INTRAVENOUS | Status: DC
Start: 2014-11-08 — End: 2014-11-08

## 2014-11-08 MED ORDER — CLINDAMYCIN PHOSPHATE 600 MG/50ML IV SOLN: 600.0000 mg | Freq: Once | INTRAVENOUS | Status: DC

## 2014-11-08 NOTE — ED Notes (Signed)
Pt. reports clear drainage at left lateral chest ( s/p chest tube insertion for pneumothorax last month) incision yesterday  , denies pain / respirations unlabored .  New dressing applied at triage , incision sutures intact / no swelling or bleeding .

## 2014-11-08 NOTE — Discharge Instructions (Signed)
Discharge Instructions   1. Please change dressing at least once a day, or as needed during the day for drainage 2. Please wash incision daily via shower or with sponge bath 3. Contact office at [phone] with further concerns or problems

## 2014-11-08 NOTE — ED Notes (Signed)
Surgery at bedside with patient.

## 2014-11-08 NOTE — Consult Note (Signed)
Subjective:   Patient is a 64 y.o. female well known to [facility].  She is S/P Left VATS with resection of blebs.  This was done on 10/31/2014.  Her hospital course was relatively uncomplicated and she was discharged home on 11/04/2014.  The patient presented to the ED last night with complaints of increased drainage from her chest tube site.  She states it was so wet the dressing just fell off.  She states the drainage was not purulent.  She has some pain at her chest tube site which Ibuprofen and Tylenol is not really helping.  She declined narcotics at discharge.  I spoke with the patient at which time she states she has not changed the dressing since discharge.  She has also not taken a shower or washed the incision since then.  She states she thinks the site drains after she coughs. She denies fevers, chills, and sweats.    Patient Active Problem List   Diagnosis Date Noted  . Pneumothorax, spontaneous, tension 10/26/2014   Past Medical History  Diagnosis Date  . COPD (chronic obstructive pulmonary disease) (HCC)   . Diverticulitis     Past Surgical History  Procedure Laterality Date  . Cesarean section    . Video assisted thoracoscopy Left 10/31/2014    Procedure: LEFT VIDEO ASSISTED THORACOSCOPY;  Surgeon: Alleen Borne, MD;  Location: [facility] OR;  Service: Thoracic;  Laterality: Left;  . Stapling of blebs Left 10/31/2014    Procedure: STAPLING OF BLEBS;  Surgeon: Alleen Borne, MD;  Location: [facility] OR;  Service: Thoracic;  Laterality: Left;     (Not in a hospital admission) No Known Allergies  Social History  Substance Use Topics  . Smoking status: Current Every Day Smoker  . Smokeless tobacco: Not on file  . Alcohol Use: No    No family history on file.   Review of Systems Pertinent items are noted in HPI.  Objective:   Patient Vitals for the past 8 hrs:  BP Temp Temp src Pulse Resp SpO2 Height Weight  11/08/14 0800 114/81 mmHg - - 84 20 93 % - -  11/08/14 0730 133/83 mmHg - - 84 20  94 % - -  11/08/14 0710 150/77 mmHg - - 107 24 92 % - -  11/08/14 0500 136/76 mmHg - - 76 - 97 % - -  11/08/14 0230 134/67 mmHg - - 76 - 97 % - -  11/08/14 0210 130/83 mmHg - - 80 16 98 % - -  11/08/14 0134 141/91 mmHg 97.9 F (36.6 C) Oral 86 26 96 % 5' (1.524 m) 178 lb 5 oz (80.882 kg)   BP 114/81 mmHg  Pulse 84  Temp(Src) 97.9 F (36.6 C) (Oral)  Resp 20  Ht 5' (1.524 m)  Wt 178 lb 5 oz (80.882 kg)  BMI 34.82 kg/m2  SpO2 93% General appearance: alert, cooperative and no distress Head: Normocephalic, without obvious abnormality, atraumatic Lungs: clear to auscultation bilaterally Heart: regular rate and rhythm Abdomen: soft, non-tender; bowel sounds normal; no masses,  no organomegaly Extremities: extremities normal, atraumatic, no cyanosis or edema Skin: incisions are well healed, chest tube site is clean with some mild serous drainage present Neurologic: Grossly normal  CT Scan:  1. Unusual complex pattern of air along the left anterolateral chest wall, extending along the inferior aspect of the left breast and just overlying the anterior intercostal musculature. This is near the course of the patient's prior chest tube, and is highly concerning for  necrotizing fasciitis. 2. Loculated small to moderate left-sided pleural effusion with associated atelectasis. 3. Bilateral emphysematous change, most prominent at the upper lung lobes.   Assessment:   S/P Left VATS 10/06/2014, discharged 10/1 presents with serous drainage from chest tube site.  However, patient has not changed dressing since she left the hospital and the wound has not been washed.  Plan:   1. Leave chest tube stitch in place until Monday, will reschedule suture removal appointment.  I was going to add an additional stitch to the wound, but patient declined 2. Serous Drainage from chest tube site- not acutely infected, patient instructed that dressing should be changed at least once per day or as needed  throughout the day if drainage is high.  She was instructed to cleanse her wound with soap and water daily 3. ?Necrotizing Facitis per CT Scan- we are not concerned about this... The air present in subcutaneous tissue is normal changes after chest tube removal 4. Pain control- patient will continue Tylenol prn, she is agreeable to try Ultram, prescription provided 5. Dispo- patient may be discharged home... She was instructed to contact our office should further issues arise, we will see her on Monday for wound check and suture removal

## 2014-11-08 NOTE — ED Provider Notes (Signed)
CSN: [id number]     Arrival date & time 11/08/14  0127 History  By signing my name below, I, Freida Busman, attest that this documentation has been prepared under the direction and in the presence of Tomasita Crumble, MD . Electronically Signed: Freida Busman, Scribe. 11/08/2014. 3:04 AM.  Chief Complaint  Patient presents with  . Post-op Problem   The history is provided by the patient. No language interpreter was used.    HPI Comments:  [name] [name] is a 64 y.o. female who presents to the Emergency Department to have wound from left sided chest tube placement evaluated. Pt states the dressing has been falling off due to the amount of drainage from the site.She denies purulent drainage. She reports associated mild pain to the site which she describes as sensitivity. Pt was seen in the ED on 10/26/14 for SOB; she was diagnosed with pneumothorax and then had the chest tube placed. Pt then had a thorascpoy on 10/31/14 and was discharged home with oxycodone for pain which she has not been taking. She has instead been taking morin and tylenol with moderate relief.   Past Medical History  Diagnosis Date  . COPD (chronic obstructive pulmonary disease) (HCC)   . Diverticulitis    Past Surgical History  Procedure Laterality Date  . Cesarean section    . Video assisted thoracoscopy Left 10/31/2014    Procedure: LEFT VIDEO ASSISTED THORACOSCOPY;  Surgeon: Alleen Borne, MD;  Location: [facility] OR;  Service: Thoracic;  Laterality: Left;  . Stapling of blebs Left 10/31/2014    Procedure: STAPLING OF BLEBS;  Surgeon: Alleen Borne, MD;  Location: [facility] OR;  Service: Thoracic;  Laterality: Left;   No family history on file. Social History  Substance Use Topics  . Smoking status: Current Every Day Smoker  . Smokeless tobacco: None  . Alcohol Use: No   OB History    No data available     Review of Systems  A complete 10 system review of systems was obtained and all systems are negative except as noted in the  HPI and PMH.   Allergies  Review of patient's allergies indicates no known allergies.  Home Medications   Prior to Admission medications   Medication Sig Start Date End Date Taking? Authorizing Provider  albuterol (PROVENTIL HFA;VENTOLIN HFA) 108 (90 BASE) MCG/ACT inhaler Inhale 1-2 puffs into the lungs every 6 (six) hours as needed for wheezing or shortness of breath.   Yes Historical Provider, MD  budesonide-formoterol (SYMBICORT) 80-4.5 MCG/ACT inhaler Inhale 2 puffs into the lungs 2 (two) times daily as needed (shortness of breath).   Yes Historical Provider, MD  ibuprofen (ADVIL,MOTRIN) 200 MG tablet Take 200 mg by mouth every 6 (six) hours as needed for mild pain or moderate pain.   Yes Historical Provider, MD  ipratropium-albuterol (DUONEB) 0.5-2.5 (3) MG/3ML SOLN Take 3 mLs by nebulization 3 (three) times daily as needed (shortness of breath, wheezing).  09/12/14  Yes Historical Provider, MD  montelukast (SINGULAIR) 10 MG tablet Take 10 mg by mouth daily as needed (allergies).  10/02/14  Yes Historical Provider, MD  oxyCODONE (OXY IR/ROXICODONE) 5 MG immediate release tablet Take 1-2 tablets (5-10 mg total) by mouth every 4 (four) hours as needed for severe pain. 11/04/14  Yes Erin R Barrett, PA-C   BP 130/83 mmHg  Pulse 80  Temp(Src) 97.9 F (36.6 C) (Oral)  Resp 16  Ht 5' (1.524 m)  Wt 178 lb 5 oz (80.882 kg)  BMI 34.82  kg/m2  SpO2 98% Physical Exam  Constitutional: She is oriented to person, place, and time. She appears well-developed and well-nourished. No distress.  HENT:  Head: Normocephalic and atraumatic.  Nose: Nose normal.  Mouth/Throat: Oropharynx is clear and moist. No oropharyngeal exudate.  Eyes: Conjunctivae and EOM are normal. Pupils are equal, round, and reactive to light. No scleral icterus.  Neck: Normal range of motion. Neck supple. No JVD present. No tracheal deviation present. No thyromegaly present.  Cardiovascular: Normal rate, regular rhythm and normal  heart sounds.  Exam reveals no gallop and no friction rub.   No murmur heard. Pulmonary/Chest: Effort normal and breath sounds normal. No respiratory distress. She has no wheezes. She exhibits no tenderness.  Abdominal: Soft. Bowel sounds are normal. She exhibits no distension and no mass. There is no tenderness. There is no rebound and no guarding.  Musculoskeletal: Normal range of motion. She exhibits no edema or tenderness.  Lymphadenopathy:    She has no cervical adenopathy.  Neurological: She is alert and oriented to person, place, and time. No cranial nerve deficit. She exhibits normal muscle tone.  Skin: Skin is warm and dry. No rash noted. No erythema. No pallor.  Chest tube incision to left lateral chest with suture in place; Mild TTP; no erythema, warmth or drainage   Nursing note and vitals reviewed.   ED Course  Procedures   DIAGNOSTIC STUDIES:  Oxygen Saturation is 96% on RA, normal by my interpretation.    COORDINATION OF CARE:  2:45 AM Discussed treatment plan with pt at bedside and pt agreed to plan.  Labs Review Labs Reviewed  I-STAT CHEM 8, ED - Abnormal; Notable for the following:    BUN 21 (*)    Glucose, Bld 107 (*)    All other components within normal limits  CULTURE, BLOOD (ROUTINE X 2)  CULTURE, BLOOD (ROUTINE X 2)  CBC WITH DIFFERENTIAL/PLATELET    Imaging Review Ct Chest W Contrast  11/08/2014   CLINICAL DATA:  Drainage and pain at the prior site of the patient's left-sided chest tube. Assess for deep infection. Initial encounter.  EXAM: CT CHEST WITH CONTRAST  TECHNIQUE: Multidetector CT imaging of the chest was performed during intravenous contrast administration.  CONTRAST:  75mL OMNIPAQUE IOHEXOL 300 MG/ML  SOLN  COMPARISON:  Chest radiograph performed 11/04/2014  FINDINGS: There is an unusual pattern of air along the left anterolateral chest wall, extending along the inferior aspect of the left breast and just overlying the anterior intercostal  musculature. This is near the course of the patient's prior chest tube. The appearance is highly concerning for necrotizing fasciitis.  There is no evidence of abscess. A loculated small to moderate left-sided pleural effusion is seen, with associated atelectasis. Bilateral emphysematous change is noted, most prominent at the upper lung lobes. No pneumothorax is identified. No masses are seen.  The mediastinum is unremarkable in appearance. No mediastinal lymphadenopathy is seen. No pericardial effusions identified. The great vessels are grossly unremarkable in appearance, aside from mild scattered calcification along the proximal great vessels. The visualized portions of the thyroid gland are unremarkable. No axillary lymphadenopathy is seen.  The visualized portions of the liver and spleen are unremarkable. The visualized portions of the pancreas, adrenal glands, gallbladder and kidneys are within normal limits.  No acute osseous abnormalities are identified.  IMPRESSION: 1. Unusual complex pattern of air along the left anterolateral chest wall, extending along the inferior aspect of the left breast and just overlying the anterior intercostal  musculature. 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 2. Loculated small to moderate left-sided pleural effusion with associated atelectasis. 3. Bilateral emphysematous change, most prominent at the upper lung lobes. Critical Value/emergent results were called by telephone at the time of interpretation on 11/08/2014 at 4:05 am to Dr. Tomasita Crumble, who verbally acknowledged these results.   Electronically Signed   By: Roanna Raider M.D.   On: 11/08/2014 04:26   I have personally reviewed and evaluated these images and lab results as part of my medical decision-making.   EKG Interpretation None      MDM   Final diagnoses:  None   Patient presents to the emergency department for evaluation of pain  at left chest tube site.  She states there is been a large amount of drainage coming from that area which has removed her dressing. She is unable to tell me the color of the drainage. She denies any fevers or systemic symptoms. Patient is not concerned for any infection does not want to be admitted as she was recently admitted for 1 week. Her physical exam to me is also not concerning for infection, there is no surrounding erythema or purulent discharge. Patient is appropriately tender. CT scan reveals possible necrotizing fasciitis due to the subcutaneous air. I'm unsure if this is a result from her recent VATS surgery or persistent infection. I consult to cardiothoracic surgery will evaluate the patient in the morning for disposition. Blood cultures were drawn, antibiotics were withheld at this time.    I, Heidemarie Goodnow, personally performed the services described in this documentation. All medical record entries made by the scribe were at my direction and in my presence.  I have reviewed the chart and discharge instructions and agree that the record reflects my personal performance and is accurate and complete. Myliah Medel.  11/08/2014. 5:23 AM.     Tomasita Crumble, MD 11/08/14 [phone]

## 2014-11-10 ENCOUNTER — Encounter: Payer: Self-pay | Admitting: Internal Medicine

## 2014-11-10 ENCOUNTER — Ambulatory Visit (INDEPENDENT_AMBULATORY_CARE_PROVIDER_SITE_OTHER): Payer: 59 | Admitting: Internal Medicine

## 2014-11-10 VITALS — BP 112/88 | HR 87 | Ht 60.0 in | Wt 177.2 lb

## 2014-11-10 DIAGNOSIS — E669 Obesity, unspecified: Secondary | ICD-10-CM

## 2014-11-10 DIAGNOSIS — J449 Chronic obstructive pulmonary disease, unspecified: Secondary | ICD-10-CM | POA: Diagnosis not present

## 2014-11-10 DIAGNOSIS — J930 Spontaneous tension pneumothorax: Secondary | ICD-10-CM | POA: Diagnosis not present

## 2014-11-10 MED ORDER — UMECLIDINIUM BROMIDE 62.5 MCG/INH IN AEPB: 1.0000 | INHALATION_SPRAY | Freq: Every morning | RESPIRATORY_TRACT | Status: DC

## 2014-11-10 MED ORDER — ALBUTEROL SULFATE (2.5 MG/3ML) 0.083% IN NEBU: 2.5000 mg | INHALATION_SOLUTION | RESPIRATORY_TRACT | Status: DC | PRN

## 2014-11-10 MED ORDER — ALPRAZOLAM 0.5 MG PO TABS: 0.5000 mg | ORAL_TABLET | Freq: Three times a day (TID) | ORAL | Status: DC | PRN

## 2014-11-10 MED ORDER — BUDESONIDE-FORMOTEROL FUMARATE 160-4.5 MCG/ACT IN AERO: INHALATION_SPRAY | RESPIRATORY_TRACT | Status: DC

## 2014-11-10 NOTE — Patient Instructions (Addendum)
Plan A = Automatic = Symbiocrt 160 one twice daily and Incruse one click each am   Plan B = Backup - Only use your albuterol (Yellow/proventil) as a rescue medication to be used if you can't catch your breath by resting or doing a relaxed purse lip breathing pattern.  - The less you use it, the better it will work when you need it. - Ok to use up to 2 puffs  every 4 hours if you must but call for immediate appointment if use goes up over your usual need - Don't leave home without it !!  (think of it like the spare tire for your car)   Plan C = Crisis/ contingency - nebulizer, ok use with albuterol (or combination until you use it up)  every 4 hours if plan B is not working    For pain > tramadol 50 mg up to 2 every 4 hours   For nerves> alprazolam 0.5 mg up to one every 4 hours   Please schedule a follow up office visit in 2 weeks, sooner if needed  - late add needs alpha one AT on return       Plan A = Automtico = 160 Symbiocrt uno dos veces al da y Incruse un clic cada am  Plan B = Copia de seguridad - Slo use su albuterol (amarillo / proventil) como medicacin de rescate que se utilizar si no se puede Paramedic al descansar o hacer un patrn del monedero labio respiracin relajada. - Cuanto menos se use, mejor funcionar cuando lo necesite. - Ok para Chemical engineer un mximo de 2 inhalaciones cada 4 horas si es necesario, pero llame para una cita inmediata en caso de utilizacin sube por encima de su necesidad habitual - No salga de casa sin ella !! (Pensar en l como el neumtico de repuesto para su coche)  Plan C = Crisis / contingencia - Nebulizador, el uso bien con albuterol (o combinacin hasta que se utiliza para [place]) cada 4 horas si el plan B no est funcionando   Para el dolor> tramadol 50 mg hasta 2 cada 4 horas  Para nervios> alprazolam 0,5 mg, hasta una cada 4 horas  Por favor, programar una visita de seguimiento de oficinas en 2 semanas, antes si es          Plan A = Automtico = 160 Symbiocrt uno dos veces al da y Incruse un clic cada am  Plan B = Copia de seguridad - Slo use su albuterol (amarillo / proventil) como medicacin de rescate que se utilizar si no se puede Paramedic al descansar o Radio producer un patrn del monedero labio respiracin relajada. - Cuanto menos se use, mejor funcionar cuando lo necesite. - Ok para Chemical engineer un mximo de 2 inhalaciones cada 4 horas si es necesario, pero llame para una cita inmediata en caso de utilizacin sube por encima de su necesidad habitual - No salga de casa sin ella !! (Pensar en l como el neumtico de repuesto para su coche)  Plan C = Crisis / contingencia - Nebulizador, el uso bien con albuterol (o combinacin hasta que se utiliza para [place]) cada 4 horas si el plan B no est funcionando   Para el dolor> tramadol 50 mg hasta 2 cada 4 horas  Para nervios> alprazolam 0,5 mg, hasta una cada 4 horas  Por favor, programar una visita de seguimiento de oficinas en 2 semanas, antes si es

## 2014-11-10 NOTE — Progress Notes (Signed)
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 PULMONARY CONSULT    Spontaneous Left Pneumothorax. Pt states that she has increased dyspnea with exertion. Pt c/o of occasional wet cough with yellow to clear mucus and chest pain with breathing. Pt denies any wheeze.   still way overusing saba at baseline mostly in neb form with doe x across the room and not using symbicort or pain meds correctly (see a/p)  No obvious  patterns in day to day or daytime variabilty or assoc   chest tightness, subjective wheeze overt sinus or hb symptoms. No unusual exp hx or h/o childhood pna/ asthma or knowledge of premature birth.   Also denies any obvious fluctuation of symptoms with weather or environmental changes or other aggravating or alleviating factors except as outlined above   Current Medications, Allergies, Complete Past Medical History, Past Surgical History, Family History, and Social History were reviewed in [employer] record.              Review of Systems  Constitutional: Positive for chills. Negative for fever and unexpected weight change.  HENT: Positive for congestion and postnasal drip. Negative for dental problem, ear pain, nosebleeds, rhinorrhea, sinus pressure, sneezing, sore throat, trouble swallowing and voice change.   Eyes: Negative.  Negative for visual disturbance.  Respiratory: Positive for cough and shortness  of breath. Negative for choking.   Cardiovascular: Negative.  Negative for chest pain and leg swelling.  Gastrointestinal: Negative.  Negative for vomiting, abdominal pain and diarrhea.  Endocrine: Negative.   Genitourinary: Negative.  Negative for difficulty urinating.  Musculoskeletal: Positive for arthralgias.  Skin: Negative.  Negative for rash.  Allergic/Immunologic: Negative.   Neurological: Negative.  Negative for tremors, syncope and headaches.  Hematological: Negative.  Does not bruise/bleed easily.  Psychiatric/Behavioral: Negative.        Objective:   Physical Exam  amb latino female nad   Wt Readings from Last 3 Encounters:  11/10/14 177 lb 3.2 oz (80.377 kg)  11/08/14 178 lb 5 oz (80.882 kg)  10/26/14 177 lb 1.6 oz (80.332 kg)    Vital signs reviewed   HEENT: nl dentition, turbinates, and orophanx. Nl external ear canals without cough reflex   NECK :  without JVD/Nodes/TM/ nl carotid upstrokes bilaterally   LUNGS: no acc muscle use, distant bs bilaterally, min end exp wheeze    CV:  RRR  no s3 or murmur or increase in P2, no edema   ABD:  soft and nontender with nl excursion in the supine position. No bruits or organomegaly, bowel sounds nl  MS:  warm without deformities, calf tenderness, cyanosis or clubbing  SKIN: warm and dry - wounds L chest wall clean dry no calor or erythema  NEURO:  alert, approp, no deficits     11/08/14 CT chest 1. Unusual complex pattern of air along the left anterolateral chest wall, extending along the inferior aspect of the left breast  and just overlying the anterior intercostal musculature. 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 2. Loculated small to moderate left-sided pleural effusion with associated atelectasis. 3. Bilateral emphysematous change, most prominent at the upper lung lobes.     Assessment & Plan:

## 2014-11-11 DIAGNOSIS — J449 Chronic obstructive pulmonary disease, unspecified: Secondary | ICD-10-CM | POA: Insufficient documentation

## 2014-11-11 NOTE — Assessment & Plan Note (Addendum)
Based on her history and exam she has at least moderate if not severe COPD at this point but has not really been treated with anything but Sharolyn Douglas /sama to this point so is really hard to tell. Rarely she has deteriorated over the last 5 or 6 years since she was originally diagnosed.  DDX of  difficult airways management all start with A and  include Adherence, Ace Inhibitors, Acid Reflux, Active Sinus Disease, Alpha 1 Antitripsin deficiency, Anxiety masquerading as Airways dz,  ABPA,  allergy(esp in young), Aspiration (esp in elderly), Adverse effects of meds,  Active smokers, A bunch of PE's (a small clot burden can't cause this syndrome unless there is already severe underlying pulm or vascular dz with poor reserve) plus two Bs  = Bronchiectasis and Beta blocker use..and one C= CHF  Adherence is always the initial "prime suspect" and is a multilayered concern that requires a "trust but verify" approach in every patient - starting with knowing how to use medications, especially inhalers, correctly, keeping up with refills and understanding the fundamental difference between maintenance and prns vs those medications only taken for a very short course and then stopped and not refilled.  - The proper method of use, as well as anticipated side effects, of a metered-dose inhaler are discussed and demonstrated to the patient. Improved effectiveness after extensive coaching during this visit to a level of approximately  75% from a baseline of less than 10% so I recommended she continue the Symbicort 160 at a dose of 2 puffs every 12 hours and add lama = Incruse  at this point.  Active smoking > denies since admit   I reviewed the Fletcher curve with the patient that basically indicates  if you quit smoking when your best day FEV1 is still relatively well  preserved (as may or may not be   the case here)  it is highly unlikely you will progress to more severe disease and informed the patient there was no medication  on the market that has proven to alter the curve/ its downward trajectory  or the likelihood of progression of their disease.  Therefore stopping smoking and maintaining abstinence is the most important aspect of care, not choice of inhalers or for that matter, doctors.    ? Anxiety > usually at the bottom of this list of usual suspects but should be much higher on this pt's based on H and P and note already on psychotropics .   ? Allergy > I strongly doubt this as much to do with her symptoms but is fine for now to continue Singulair daily  ? Alpha one > needs to complete screening on return   I had an extended discussion with the patient/son reviewing all relevant studies completed to date and  lasting 35 m   Each maintenance medication was reviewed in detail including most importantly the difference between maintenance and prns and under what circumstances the prns are to be triggered using an action plan format that is not reflected in the computer generated alphabetically organized AVS.    Please see instructions for details which were reviewed in writing (in Spanish version)  and the patient given a copy highlighting the part that I personally wrote and discussed at today's ov.

## 2014-11-11 NOTE — Assessment & Plan Note (Signed)
10/31/2014 Procedure: Left video-assisted thoracoscopy, stapling of apical bleb, mechanical pleurodesis  Defer follow-up to Dr. Laneta Simmers  With  plans to see the patient in 3 days and she appears to be healing well. She Does need to understand better how to treat pain

## 2014-11-11 NOTE — Assessment & Plan Note (Signed)
Body mass index is 34.61 kg/(m^2).  No results found for: TSH   Contributing to gerd tendency/ doe/and this may increase related to recent smoking cessation reviewed the need and the process to achieve and maintain neg calorie balance > defer f/u primary care including intermittently monitoring thyroid status

## 2014-11-13 ENCOUNTER — Ambulatory Visit (INDEPENDENT_AMBULATORY_CARE_PROVIDER_SITE_OTHER): Payer: Self-pay

## 2014-11-13 VITALS — BP 108/82 | HR 84 | Resp 16 | Ht 60.0 in | Wt 177.0 lb

## 2014-11-13 DIAGNOSIS — J930 Spontaneous tension pneumothorax: Secondary | ICD-10-CM

## 2014-11-13 DIAGNOSIS — Z09 Encounter for follow-up examination after completed treatment for conditions other than malignant neoplasm: Secondary | ICD-10-CM

## 2014-11-13 DIAGNOSIS — IMO0002 Reserved for concepts with insufficient information to code with codable children: Secondary | ICD-10-CM

## 2014-11-13 DIAGNOSIS — J939 Pneumothorax, unspecified: Secondary | ICD-10-CM

## 2014-11-13 DIAGNOSIS — J9382 Other air leak: Secondary | ICD-10-CM

## 2014-11-13 DIAGNOSIS — Z736 Limitation of activities due to disability: Secondary | ICD-10-CM

## 2014-11-13 LAB — CULTURE, BLOOD (ROUTINE X 2)
CULTURE: NO GROWTH
Culture: NO GROWTH

## 2014-11-24 ENCOUNTER — Encounter: Payer: Self-pay | Admitting: Internal Medicine

## 2014-11-24 ENCOUNTER — Other Ambulatory Visit: Payer: 59

## 2014-11-24 ENCOUNTER — Ambulatory Visit (INDEPENDENT_AMBULATORY_CARE_PROVIDER_SITE_OTHER): Payer: 59 | Admitting: Internal Medicine

## 2014-11-24 VITALS — BP 136/84 | HR 82 | Ht 63.0 in | Wt 177.0 lb

## 2014-11-24 DIAGNOSIS — E669 Obesity, unspecified: Secondary | ICD-10-CM | POA: Diagnosis not present

## 2014-11-24 DIAGNOSIS — J930 Spontaneous tension pneumothorax: Secondary | ICD-10-CM

## 2014-11-24 DIAGNOSIS — J449 Chronic obstructive pulmonary disease, unspecified: Secondary | ICD-10-CM | POA: Diagnosis not present

## 2014-11-24 MED ORDER — TRAMADOL HCL 50 MG PO TABS: 50.0000 mg | ORAL_TABLET | Freq: Four times a day (QID) | ORAL | Status: DC | PRN

## 2014-11-24 MED ORDER — ALBUTEROL SULFATE HFA 108 (90 BASE) MCG/ACT IN AERS: 1.0000 | INHALATION_SPRAY | Freq: Four times a day (QID) | RESPIRATORY_TRACT | Status: DC | PRN

## 2014-11-24 NOTE — Patient Instructions (Addendum)
Please remember to go to the lab department downstairs for your tests - we will call you with the results when they are available.  symbicort 160 Take 2 puffs first thing in am and then another 2 puffs about 12 hours later.   Goal is not to need to rescue albuterol  much at all   Please schedule a follow up office visit in 6 weeks, call sooner if needed

## 2014-11-24 NOTE — Progress Notes (Signed)
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 PULMONARY CONSULT    Spontaneous Left Pneumothorax. Pt states that she has increased dyspnea with exertion. Pt c/o of occasional wet cough with yellow to clear mucus and chest pain with breathing. Pt denies any wheeze.   still way overusing saba at baseline mostly in neb form with doe x across the room and not using symbicort or pain meds correctly (see a/p) rec Plan A = Automatic = Symbiocrt 160 one twice daily and Incruse one click each am  Plan B = Backup - Only use your albuterol (Yellow/proventil) as a rescue medication Plan C = Crisis/ contingency - nebulizer, ok use with albuterol (or combination until you use it up)  every 4 hours if plan B is not working  For pain > tramadol 50 mg up to 2 every 4 hours  For nerves> alprazolam 0.5 mg up to one every 4 hours      11/24/2014  f/u ov/[name] [name] re: copd/ pfts and alpha one status pending on symb/ incruse still using saba  Chief Complaint  Patient presents with  . Follow-up    pt following for COPD: pt states she is doing pretty well, she is still having pain on her left side, but it us much better.  pt states her breathing has been very well. pt c/o increase SOB when she exerts herself. no c/o chest tightness. pt needs refills on meds.    doe = MMRC1 = can walk nl pace, flat grade, can't hurry or go uphills or steps s sob      No obvious day to  day or daytime variability or assoc chronic cough or  chest tightness, subjective wheeze or overt sinus or hb symptoms. No unusual exp hx or h/o childhood pna/ asthma or knowledge of premature birth.  Sleeping ok without nocturnal  or early am exacerbation  of respiratory  c/o's or need for noct saba. Also denies any obvious fluctuation of symptoms with weather or environmental changes or other aggravating or alleviating factors except as outlined above   Current Medications, Allergies, Complete Past Medical History, Past Surgical History, Family History, and Social History were reviewed in [facility] electronic medical record.  ROS  The following are not active complaints unless bolded sore throat, dysphagia, dental problems, itching, sneezing,  nasal congestion or excess/ purulent secretions, ear ache,   fever, chills, sweats, unintended wt loss, classically pleuritic or exertional cp, hemoptysis,  orthopnea pnd or leg swelling, presyncope, palpitations, abdominal pain, anorexia, nausea, vomiting, diarrhea  or change in bowel or bladder habits, change in stools or urine, dysuria,hematuria,  rash, arthralgias, visual complaints, headache, numbness, weakness or ataxia or problems with walking or coordination,  change in mood/affect or memory.                  Objective:   Physical Exam  amb latino female nad  11/24/2014      177  Wt Readings from Last 3 Encounters:  11/10/14 177 lb 3.2 oz (80.377 kg)  11/08/14 178 lb 5 oz (80.882 kg)  10/26/14 177 lb 1.6 oz (80.332 kg)    Vital signs reviewed   HEENT: nl dentition, turbinates, and orophanx. Nl external ear canals without cough reflex   NECK :  without JVD/Nodes/TM/ nl carotid upstrokes bilaterally   LUNGS: no acc muscle use, distant bs bilaterally,    CV:  RRR  no s3 or murmur or increase in P2, no edema   ABD:  soft and nontender with nl excursion in the supine position. No bruits or organomegaly, bowel sounds nl  MS:   warm without deformities, calf tenderness, cyanosis or clubbing  SKIN: warm and dry - wounds L chest wall clean dry no calor or erythema/ stitches out   NEURO:  alert, approp, no deficits     11/08/14 CT chest 1. Unusual complex pattern of air along the left anterolateral chest wall, extending along the inferior aspect of the left breast and just overlying the anterior intercostal musculature. 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 2. Loculated small to moderate left-sided pleural effusion with associated atelectasis. 3. Bilateral emphysematous change, most prominent at the upper lung lobes.     Assessment & Plan:

## 2014-11-25 ENCOUNTER — Encounter: Payer: Self-pay | Admitting: Internal Medicine

## 2014-11-25 NOTE — Assessment & Plan Note (Addendum)
-   11/10/2014  extensive coaching HFA effectiveness =    75% > try new maint rx = symb 160/incruse sample - Alpha one AT 11/24/2014 >>>  Struggling with concept of maint vs prns   11/24/2014  extensive coaching HFA effectiveness =    75% so still needs work   I had an extended discussion with the patient thru intpreter reviewing all relevant studies completed to date and  lasting 15 to 20 minutes of a 25 minute visit    Each maintenance medication was reviewed in detail including most importantly the difference between maintenance and prns and under what circumstances the prns are to be triggered using an action plan format that is not reflected in the computer generated alphabetically organized AVS.    Please see instructions for details which were reviewed in writing and the patient given a copy highlighting the part that I personally wrote and discussed at today's ov.

## 2014-11-25 NOTE — Assessment & Plan Note (Signed)
Body mass index is 31.36 trending down sltly   No results found for: TSH   Contributing to gerd tendency/ doe/reviewed the need and the process to achieve and maintain neg calorie balance > defer f/u primary care including intermittently monitoring thyroid status

## 2014-11-25 NOTE — Assessment & Plan Note (Signed)
10/31/2014 Procedure: Left video-assisted thoracoscopy, stapling of apical bleb, mechanical pleurodesis  F/u T surgery/ alpha one AT screen sent

## 2014-11-27 ENCOUNTER — Encounter (HOSPITAL_COMMUNITY): Payer: Self-pay | Admitting: Surgery

## 2014-11-27 LAB — ALPHA-1-ANTITRYPSIN: A1 ANTITRYPSIN SER: 178 mg/dL (ref 83–199)

## 2014-11-27 NOTE — Addendum Note (Signed)
Addendum  created 11/27/14 [id number] by Lewie LoronJohn Chloris Marcoux, MD   Modules edited: Anesthesia Events

## 2014-11-29 ENCOUNTER — Ambulatory Visit: Payer: 59 | Admitting: Surgery

## 2014-12-01 LAB — ALPHA-1 ANTITRYPSIN PHENOTYPE: A-1 Antitrypsin: 179 mg/dL (ref 83–199)

## 2014-12-05 ENCOUNTER — Other Ambulatory Visit: Payer: Self-pay | Admitting: Surgery

## 2014-12-05 DIAGNOSIS — J930 Spontaneous tension pneumothorax: Secondary | ICD-10-CM

## 2014-12-06 ENCOUNTER — Ambulatory Visit: Payer: Self-pay | Admitting: Surgery

## 2014-12-13 ENCOUNTER — Encounter: Payer: Self-pay | Admitting: Surgery

## 2014-12-13 ENCOUNTER — Ambulatory Visit
Admission: RE | Admit: 2014-12-13 | Discharge: 2014-12-13 | Disposition: A | Discharge status: Home / Self Care | Payer: 59 | Source: Ambulatory Visit | Attending: Surgery | Admitting: Surgery

## 2014-12-13 ENCOUNTER — Ambulatory Visit (INDEPENDENT_AMBULATORY_CARE_PROVIDER_SITE_OTHER): Payer: Self-pay | Admitting: Surgery

## 2014-12-13 VITALS — BP 148/95 | HR 86 | Ht 63.0 in | Wt 177.0 lb

## 2014-12-13 DIAGNOSIS — IMO0002 Reserved for concepts with insufficient information to code with codable children: Secondary | ICD-10-CM

## 2014-12-13 DIAGNOSIS — J939 Pneumothorax, unspecified: Secondary | ICD-10-CM

## 2014-12-13 DIAGNOSIS — J930 Spontaneous tension pneumothorax: Secondary | ICD-10-CM

## 2014-12-13 DIAGNOSIS — J9382 Other air leak: Secondary | ICD-10-CM

## 2014-12-13 DIAGNOSIS — Z09 Encounter for follow-up examination after completed treatment for conditions other than malignant neoplasm: Secondary | ICD-10-CM

## 2014-12-13 MED ORDER — OXYCODONE-ACETAMINOPHEN 5-325 MG PO TABS: 1.0000 | ORAL_TABLET | Freq: Four times a day (QID) | ORAL | Status: DC | PRN

## 2014-12-13 MED ORDER — TRAMADOL HCL 50 MG PO TABS: 50.0000 mg | ORAL_TABLET | Freq: Four times a day (QID) | ORAL | Status: DC | PRN

## 2014-12-15 ENCOUNTER — Encounter: Payer: Self-pay | Admitting: Surgery

## 2014-12-15 NOTE — Progress Notes (Signed)
HPI:  Patient returns for routine postoperative follow-up having undergone left VATS with apical bleb staping and mechanical pleurodesis on 10/31/2014. The patient's early postoperative recovery while in the hospital was notable for an uncomplicated postop course. Since hospital discharge the patient reports that she has been having a lot of pain in the left chest wall. She is still taking oxycodone and Ultram.   Current Outpatient Prescriptions  Medication Sig Dispense Refill  . Acetaminophen (TYLENOL) 325 MG CAPS Take 1 tablet by mouth as needed.    Marland Kitchen albuterol (PROVENTIL HFA;VENTOLIN HFA) 108 (90 BASE) MCG/ACT inhaler Inhale 1-2 puffs into the lungs every 6 (six) hours as needed for wheezing or shortness of breath. 1 Inhaler 11  . albuterol (PROVENTIL) (2.5 MG/3ML) 0.083% nebulizer solution Take 3 mLs (2.5 mg total) by nebulization every 4 (four) hours as needed for wheezing or shortness of breath. 75 mL 12  . ALPRAZolam (XANAX) 0.5 MG tablet Take 1 tablet (0.5 mg total) by mouth 3 (three) times daily as needed for anxiety. 45 tablet 2  . budesonide-formoterol (SYMBICORT) 160-4.5 MCG/ACT inhaler Take 2 puffs first thing in am and then another 2 puffs about 12 hours later. 1 Inhaler 11  . ibuprofen (ADVIL,MOTRIN) 200 MG tablet Take 200 mg by mouth every 6 (six) hours as needed for mild pain or moderate pain.    . montelukast (SINGULAIR) 10 MG tablet Take 10 mg by mouth daily as needed (allergies).     Marland Kitchen oxyCODONE-acetaminophen (PERCOCET/ROXICET) 5-325 MG tablet Take 1 tablet by mouth every 6 (six) hours as needed for severe pain. 30 tablet 0  . traMADol (ULTRAM) 50 MG tablet Take 1-2 tablets (50-100 mg total) by mouth every 6 (six) hours as needed. 30 tablet 0  . Umeclidinium Bromide (INCRUSE ELLIPTA) 62.5 MCG/INH AEPB Inhale 1 puff into the lungs every morning. 30 each 11   No current facility-administered medications for this visit.    Physical Exam: BP 148/95 mmHg  Pulse 86   Ht  (1.6 m)  Wt 177 lb (80.287 kg)  BMI 31.36 kg/m2  SpO2 96% She looks well Lungs are clear The left chest incisions are healing well.  Diagnostic Tests:   CLINICAL DATA: Spontaneous left tension pneumothorax, BA TTS on September 27th for stapling of blebs, currently experiencing some chest pain with shortness of breath  EXAM: CHEST 2 VIEW  COMPARISON: CT scan of the chest of November 08, 2014 and PA and lateral chest x-ray of November 04, 2014  FINDINGS: The lungs are mildly hyperinflated. The pleural effusion on the left has resolved. There is no pneumothorax. Both upper lobes remain hyperlucent. The heart and pulmonary vascularity are normal. The bony thorax is unremarkable.  IMPRESSION: There is no evidence of recurrent pneumothorax. There is stable mild hyperinflation with known bilateral upper lobe bullous disease. The left-sided pleural effusion has resolved.   Electronically Signed  By: David [place]aziland M.D.  On: 12/13/2014 11:06           Vitals     Height Weight BMI (Calculated)     (1.6 m) 177 lb (80.287 kg) 31.4      Interpretation Summary     CLINICAL DATA: Spontaneous left tension pneumothorax, BA TTS on September 27th for stapling of blebs, currently experiencing some chest pain with shortness of breath  EXAM: CHEST 2 VIEW  COMPARISON: CT scan of the chest of November 08, 2014 and PA and lateral chest x-ray of November 04, 2014  FINDINGS: The lungs are mildly hyperinflated. The pleural effusion on the left has resolved. There is no pneumothorax. Both upper lobes remain hyperlucent. The heart and pulmonary vascularity are normal. The bony thorax is unremarkable.  IMPRESSION: There is no evidence of recurrent pneumothorax. There is stable mild hyperinflation with known bilateral upper lobe bullous disease. The left-sided pleural effusion has resolved.   Electronically Signed  By: David SwazilandJordan M.D.   On: 12/13/2014 11:06      Impression:  Overall she is making a good recovery after surgery. She still has significant pain which is probably neuralgia from the intercostal nerves. I renewed her prescriptions for Ultram and Oxy IR today and told her that I expect this pain to gradually resolve but it may take several months. I told her that she can increase her activity level as tolerated.  Plan:  I will see her back in the office in 3 weeks to follow up on her pain.    Alleen BorneBryan K Bartle, MD [facility] [phone]

## 2014-12-19 ENCOUNTER — Other Ambulatory Visit: Payer: Self-pay

## 2014-12-19 DIAGNOSIS — R071 Chest pain on breathing: Secondary | ICD-10-CM

## 2014-12-19 DIAGNOSIS — J939 Pneumothorax, unspecified: Secondary | ICD-10-CM

## 2014-12-20 ENCOUNTER — Ambulatory Visit
Admission: RE | Admit: 2014-12-20 | Discharge: 2014-12-20 | Disposition: A | Discharge status: Home / Self Care | Payer: 59 | Source: Ambulatory Visit | Attending: Surgery | Admitting: Surgery

## 2014-12-20 ENCOUNTER — Ambulatory Visit (INDEPENDENT_AMBULATORY_CARE_PROVIDER_SITE_OTHER): Payer: Self-pay | Admitting: Surgery

## 2014-12-20 ENCOUNTER — Encounter: Payer: Self-pay | Admitting: Surgery

## 2014-12-20 VITALS — BP 144/87 | HR 94 | Resp 16 | Ht 63.0 in | Wt 177.0 lb

## 2014-12-20 DIAGNOSIS — J939 Pneumothorax, unspecified: Secondary | ICD-10-CM

## 2014-12-20 DIAGNOSIS — R071 Chest pain on breathing: Secondary | ICD-10-CM

## 2014-12-20 DIAGNOSIS — Z09 Encounter for follow-up examination after completed treatment for conditions other than malignant neoplasm: Secondary | ICD-10-CM

## 2014-12-20 NOTE — Progress Notes (Signed)
HPI:  Patient returns for  postoperative follow-up having undergone left VATS with apical bleb staping and mechanical pleurodesis on 10/31/2014. I saw her in the office last week and she was having a lot of chest wall neuropathic pain. I told her that she would probably not be able to return to work until that improved. She says her employer called her and told her that if she did not return to work the next day that she would lose her benefits. 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  Current Outpatient Prescriptions  Medication Sig Dispense Refill  . Acetaminophen (TYLENOL) 325 MG CAPS Take 1 tablet by mouth as needed.    Marland Kitchen. albuterol (PROVENTIL HFA;VENTOLIN HFA) 108 (90 BASE) MCG/ACT inhaler Inhale 1-2 puffs into the lungs every 6 (six) hours as needed for wheezing or shortness of breath. 1 Inhaler 11  . albuterol (PROVENTIL) (2.5 MG/3ML) 0.083% nebulizer solution Take 3 mLs (2.5 mg total) by nebulization every 4 (four) hours as needed for wheezing or shortness of breath. 75 mL 12  . ALPRAZolam (XANAX) 0.5 MG tablet Take 1 tablet (0.5 mg total) by mouth 3 (three) times daily as needed for anxiety. 45 tablet 2  . budesonide-formoterol (SYMBICORT) 160-4.5 MCG/ACT inhaler Take 2 puffs first thing in am and then another 2 puffs about 12 hours later. 1 Inhaler 11  . ibuprofen (ADVIL,MOTRIN) 200 MG tablet Take 200 mg by mouth every 6 (six) hours as needed for mild pain or moderate pain.    . montelukast (SINGULAIR) 10 MG tablet Take 10 mg by mouth daily as needed (allergies).     Marland Kitchen. oxyCODONE-acetaminophen (PERCOCET/ROXICET) 5-325 MG tablet Take 1 tablet by mouth every 6 (six) hours as needed for severe pain. 30 tablet 0  . traMADol (ULTRAM) 50 MG tablet Take 1-2 tablets (50-100 mg total) by mouth every 6 (six) hours as needed. 30 tablet 0  . Umeclidinium Bromide (INCRUSE ELLIPTA)  62.5 MCG/INH AEPB Inhale 1 puff into the lungs every morning. 30 each 11   No current facility-administered medications for this visit.     Physical Exam: BP 144/87 mmHg  Pulse 94  Resp 16  Ht 5\' 3"  (1.6 m)  Wt 177 lb (80.287 kg)  BMI 31.36 kg/m2  SpO2 98% She is tearful and anxious about when her pain is going to resolve and when she can return to work Lungs are clear The left chest incisions are healing well   Diagnostic Tests:  CLINICAL DATA: History of pneumothorax and stapling of blebs in September of 2016, it persistent chest discomfort and fatigue, former smoking history  EXAM: CHEST 2 VIEW  COMPARISON: Chest x-ray of 12/13/2014 and CT chest of 11/08/2014  FINDINGS: Emphysematous changes are present primarily in the upper lobes bilaterally. No focal infiltrate or effusion is seen. No pneumothorax is noted. Mediastinal and hilar contours are unremarkable. The heart is within normal limits in size. No bony abnormality is seen.  IMPRESSION: Emphysema. No active lung disease. No pneumothorax.   Electronically Signed  By: Dwyane DeePaul Barry M.D.  On: 12/20/2014 11:31   Impression:  She still has considerable neuropathic pain after her VATS and mechanical pleurodesis. The CXR is normal. I would expect this to gradually improve but I don't think she should return to work until it does and that may not be until the end of December.  I considered putting her on Lyrica or Neurontin but they are very expensive and she says that she can not afford that.   Plan:  I will see her back on 01/10/2015. She should not return to work until I reevaluate her at that time and will most likely need to be out until the end of December.   Alleen Borne, MD [facility] [phone]

## 2015-01-05 ENCOUNTER — Ambulatory Visit: Payer: 59 | Admitting: Internal Medicine

## 2015-01-10 ENCOUNTER — Encounter: Payer: 59 | Admitting: Surgery

## 2015-01-10 ENCOUNTER — Other Ambulatory Visit: Payer: Self-pay | Admitting: *Deleted

## 2015-01-10 DIAGNOSIS — G8918 Other acute postprocedural pain: Secondary | ICD-10-CM

## 2015-01-10 MED ORDER — OXYCODONE-ACETAMINOPHEN 5-325 MG PO TABS: 1.0000 | ORAL_TABLET | Freq: Four times a day (QID) | ORAL | Status: DC | PRN

## 2015-01-24 ENCOUNTER — Ambulatory Visit (INDEPENDENT_AMBULATORY_CARE_PROVIDER_SITE_OTHER): Payer: Self-pay | Admitting: Surgery

## 2015-01-24 ENCOUNTER — Encounter: Payer: Self-pay | Admitting: Surgery

## 2015-01-24 VITALS — BP 128/84 | HR 96 | Resp 20 | Ht 63.0 in | Wt 175.0 lb

## 2015-01-24 DIAGNOSIS — Z09 Encounter for follow-up examination after completed treatment for conditions other than malignant neoplasm: Secondary | ICD-10-CM

## 2015-01-24 DIAGNOSIS — J939 Pneumothorax, unspecified: Secondary | ICD-10-CM

## 2015-01-24 DIAGNOSIS — J9382 Other air leak: Secondary | ICD-10-CM

## 2015-01-24 DIAGNOSIS — IMO0002 Reserved for concepts with insufficient information to code with codable children: Secondary | ICD-10-CM

## 2015-01-24 DIAGNOSIS — G8918 Other acute postprocedural pain: Secondary | ICD-10-CM

## 2015-01-24 MED ORDER — OXYCODONE-ACETAMINOPHEN 5-325 MG PO TABS
1.0000 | ORAL_TABLET | Freq: Four times a day (QID) | ORAL | Status: DC | PRN
Start: 2015-01-24 — End: 2015-03-12

## 2015-01-25 ENCOUNTER — Encounter: Payer: Self-pay | Admitting: Surgery

## 2015-01-25 NOTE — Progress Notes (Signed)
     HPI:  Patient returns for postoperative follow-up having undergone left VATS with apical bleb staping and mechanical pleurodesis on 10/31/2014. Since I last saw her on 12/20/2014 she says that her left chest wall pain is improved but still there. She is not back to work yet. She is still taking Percocet in the morning and evening and ibuprofen as needed (which she says does not do much for her).   Current Outpatient Prescriptions  Medication Sig Dispense Refill  . Acetaminophen (TYLENOL) 325 MG CAPS Take 1 tablet by mouth as needed.    Marland Kitchen. albuterol (PROVENTIL HFA;VENTOLIN HFA) 108 (90 BASE) MCG/ACT inhaler Inhale 1-2 puffs into the lungs every 6 (six) hours as needed for wheezing or shortness of breath. 1 Inhaler 11  . albuterol (PROVENTIL) (2.5 MG/3ML) 0.083% nebulizer solution Take 3 mLs (2.5 mg total) by nebulization every 4 (four) hours as needed for wheezing or shortness of breath. 75 mL 12  . ALPRAZolam (XANAX) 0.5 MG tablet Take 1 tablet (0.5 mg total) by mouth 3 (three) times daily as needed for anxiety. 45 tablet 2  . budesonide-formoterol (SYMBICORT) 160-4.5 MCG/ACT inhaler Take 2 puffs first thing in am and then another 2 puffs about 12 hours later. 1 Inhaler 11  . ibuprofen (ADVIL,MOTRIN) 200 MG tablet Take 200 mg by mouth every 6 (six) hours as needed for mild pain or moderate pain.    . montelukast (SINGULAIR) 10 MG tablet Take 10 mg by mouth daily as needed (allergies).     Marland Kitchen. oxyCODONE-acetaminophen (PERCOCET/ROXICET) 5-325 MG tablet Take 1 tablet by mouth every 6 (six) hours as needed for severe pain. 30 tablet 0  . traMADol (ULTRAM) 50 MG tablet Take 1-2 tablets (50-100 mg total) by mouth every 6 (six) hours as needed. 30 tablet 0  . Umeclidinium Bromide (INCRUSE ELLIPTA) 62.5 MCG/INH AEPB Inhale 1 puff into the lungs every morning. 30 each 11   No current facility-administered medications for this visit.     Physical Exam: BP 128/84 mmHg  Pulse 96  Resp 20  Ht 5\' 3"   (1.6 m)  Wt 175 lb (79.379 kg)  BMI 31.01 kg/m2  SpO2 95% She seems fine when we first start talking and then the more she talks about her pain the more upset she gets. Her son is with her but he does not have much to say. 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 I renewed her Percocet today and cautioned her about using the lowest effective dose and trying to use ibuprofen as much as possible. I discussed the decreasing effectiveness of narcotic pain meds the longer they are used and the risk of addiction. She says she will try to minimize the Percocet. I also discussed the importance of smoking cessation in preventing coughing and recurrent pneumothorax due to ruptured blebs. She knows that she needs to quit. I gave her information on [facility] cessation [facility].  Plan:  She will decide when her pain is improved such that she feels that she can return to work. She will return to the office then for evaluation and a return to work note.   Alleen BorneBryan K Bartle, MD [facility] [phone]

## 2015-01-31 ENCOUNTER — Encounter: Payer: Self-pay | Admitting: Internal Medicine

## 2015-01-31 ENCOUNTER — Ambulatory Visit (INDEPENDENT_AMBULATORY_CARE_PROVIDER_SITE_OTHER): Payer: 59 | Admitting: Internal Medicine

## 2015-01-31 VITALS — BP 138/84 | HR 106 | Wt 179.8 lb

## 2015-01-31 DIAGNOSIS — J930 Spontaneous tension pneumothorax: Secondary | ICD-10-CM

## 2015-01-31 DIAGNOSIS — J449 Chronic obstructive pulmonary disease, unspecified: Secondary | ICD-10-CM | POA: Diagnosis not present

## 2015-01-31 DIAGNOSIS — E669 Obesity, unspecified: Secondary | ICD-10-CM

## 2015-01-31 NOTE — Progress Notes (Signed)
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 PULMONARY CONSULT    Spontaneous Left Pneumothorax. Pt states that she has increased dyspnea with exertion. Pt c/o of occasional wet cough with yellow to clear mucus and chest pain with breathing. Pt denies any wheeze.   still way overusing saba at baseline mostly in neb form with doe x across the room and not using symbicort or pain meds correctly (see a/p) rec Plan A = Automatic = Symbiocrt 160 one twice daily and Incruse one click each am  Plan B = Backup - Only use your albuterol (Yellow/proventil) as a rescue medication Plan C = Crisis/ contingency - nebulizer, ok use with albuterol (or combination until you use it up)  every 4 hours if plan B is not working  For pain > tramadol 50 mg up to 2 every 4 hours  For nerves> alprazolam 0.5 mg up to one every 4 hours      11/24/2014  f/u ov/[name] [name] re: copd/ pfts on symb/ incruse still using saba  Chief Complaint  Patient presents with  . Follow-up    pt following for COPD: pt states she is doing pretty well, she is still having pain on her left side, but it [place] much better.  pt states her breathing has been very well. pt c/o increase SOB when she exerts herself. no c/o chest tightness. pt needs refills on meds.    doe = MMRC1 = can walk nl pace, flat grade, can't hurry or go uphills or steps s sob  rec Please remember to go to the  lab department downstairs for your tests - we will call you with the results when they are available. Symbicort 160 Take1 puffs first thing in am and then another1 puffs about 12 hours later.  Goal is not to need to rescue albuterol  much at all      01/31/2015  f/u ov/[name] [name] re: COPD III/ symbicort 160 2bid/ incruse and prn saba  Chief Complaint  Patient presents with  . Follow-up    Pt states that breathing is unchanged since last visit. Pt states that she is under a lot of stress right now which has made her breathing worse   2x daily neb but not noct/ no hfa use  Pain L cw same as post op never improved, very sensitive chest wall / not resp to tramadol so far.   No obvious day to day or daytime variability or assoc excess/ purulent sputum or mucus plugs   or  chest tightness, subjective wheeze or overt sinus or hb symptoms. No unusual exp hx or h/o childhood pna/ asthma or knowledge of premature birth.  Sleeping ok without nocturnal  or early am exacerbation  of respiratory  c/o's or need for noct saba. Also denies any obvious fluctuation of symptoms with weather or environmental changes or other aggravating or alleviating factors except as outlined above   Current  Medications, Allergies, Complete Past Medical History, Past Surgical History, Family History, and Social History were reviewed in [facility] electronic medical record.  ROS  The following are not active complaints unless bolded sore throat, dysphagia, dental problems, itching, sneezing,  nasal congestion or excess/ purulent secretions, ear ache,   fever, chills, sweats, unintended wt loss, classically pleuritic or exertional cp, hemoptysis,  orthopnea pnd or leg swelling, presyncope, palpitations, abdominal pain, anorexia, nausea, vomiting, diarrhea  or change in bowel or bladder habits, change in stools or urine, dysuria,hematuria,  rash, arthralgias, visual complaints, headache, numbness, weakness or ataxia or problems with  walking or coordination,  change in mood/affect or memory.                  Objective:   Physical Exam  amb latino female nad  11/24/2014      177 > 01/31/2015 180     11/10/14 177 lb 3.2 oz (80.377 kg)  11/08/14 178 lb 5 oz (80.882 kg)  10/26/14 177 lb 1.6 oz (80.332 kg)    Vital signs reviewed   HEENT: nl dentition, turbinates, and orophanx. Nl external ear canals without cough reflex   NECK :  without JVD/Nodes/TM/ nl carotid upstrokes bilaterally   LUNGS: no acc muscle use, distant bs bilaterally, super sensitive along L Chest wall at incision sites, jumps when touched    CV:  RRR  no s3 or murmur or increase in P2, no edema   ABD:  soft and nontender with nl excursion in the supine position. No bruits or organomegaly, bowel sounds nl  MS:  warm without deformities, calf tenderness, cyanosis or clubbing  SKIN: warm and dry    NEURO:  alert, approp, no deficits      I personally reviewed images and agree with radiology impression as follows:  CXR:  12/20/14 Emphysema. No active lung disease. No pneumothorax. .     Assessment & Plan:

## 2015-01-31 NOTE — Patient Instructions (Addendum)
Please see patient coordinator before you leave today  to schedule referral to Internal Medicine/ Dr Drue NovelPaz   Zostrix cream 4 x daily (over the counter) - as improves can reduce to bedtime only   Plan A = Automatic = Symbiocrt 160 one twice daily and Incruse one click each am   Plan B = Backup - Only use your albuterol (Yellow/proventil) as a rescue medication to be used if you can't catch your breath by resting or doing a relaxed purse lip breathing pattern.  - The less you use it, the better it will work when you need it. - Ok to use up to 2 puffs  every 4 hours if you must but call for immediate appointment if use goes up over your usual need - Don't leave home without it !!  (think of it like the spare tire for your car)   Plan C = Crisis/ contingency - nebulizer, ok use with albuterol (or combination until you use it up)  every 4 hours if plan B is not working    Please schedule a follow up visit in 3 months but call sooner if needed - once establish with Dr Drue NovelPaz follow up here can be as needed  Late add: confused as to whether she uses the symb one bid vs 2 bid will leave things as they are right now due to communication barriers but ideally the dose should be 2 pffs every 12 hours unless can't tolerate for some reason   Por favor, consulte al coordinador de pacientes antes de salir hoy para programar la remisin a [facility] / Dr Drue NovelPaz  Zostrix crema 4 x diario (sobre Retail bankerel mostrador) - como mejora puede reducir a la hora de acostarse slo  Plan A = Automtico = Symbiocrt 160 [place] dos veces al da e Incruse un clic cada uno am  Plan B = Copia de seguridad - Utilice solamente su albuterol (amarillo / proventil) como una medicacin del rescate a ser utilizado si usted no puede coger su respiracin descansando o haciendo un patrn de respiracin relajado del labio del monedero. - Cuanto menos lo utilice, mejor funcionar cuando lo necesite. - Ok para usar hasta 2 bocanadas cada 4 horas si  es necesario, pero llamar para una cita inmediata si el uso sube por encima de su necesidad habitual - No salgas de casa sin ella! (Piense en l como la rueda de repuesto para su coche)  Plan C = Crisis / contingencia - nebulizador, uso aceptable con albuterol (o combinacin hasta que lo utilice) cada 4 horas si el plan B no funciona   Por favor, programe una visita de seguimiento en 3 meses, pero llame antes si es necesario - una vez que se establezca con el Dr. Drue NovelPaz seguimiento aqu puede ser necesario

## 2015-02-05 NOTE — Assessment & Plan Note (Addendum)
-   Quit smoking 10/26/14  - 11/10/2014  new maint rx = symb 160/incruse sample - Alpha one AT 11/24/2014 >  MM/ nl levels - 01/31/2015  extensive coaching HFA effectiveness =    90%  - spirometry 01/31/2015  FEV1  0.85 (37%) ratio 34   DDX of  difficult airways management all start with A and  include Adherence, Ace Inhibitors, Acid Reflux, Active Sinus Disease, Alpha 1 Antitripsin deficiency, Anxiety masquerading as Airways dz,  ABPA,  allergy(esp in young), Aspiration (esp in elderly), Adverse effects of meds,  Active smokers, A bunch of PE's (a small clot burden can't cause this syndrome unless there is already severe underlying pulm or vascular dz with poor reserve) plus two Bs  = Bronchiectasis and Beta blocker use..and one C= CHF   Adherence is always the initial "prime suspect" and is a multilayered concern that requires a "trust but verify" approach in every patient - starting with knowing how to use medications, especially inhalers, correctly, keeping up with refills and understanding the fundamental difference between maintenance and prns vs those medications only taken for a very short course and then stopped and not refilled.  - - The proper method of use, as well as anticipated side effects, of a metered-dose inhaler are discussed and demonstrated to the patient. Improved effectiveness after extensive coaching during this visit to a level of approximately 90 % from a baseline of 75 %  - communication continues to be a barrier> for now instructions written in spanish and referred to Dr Drue NovelPaz for primary care  ? Allergy > doubt but ok to continue singulair for now    ? Active smoking > denies/ reinforced impt of maint rx  I had an extended discussion with the patient reviewing all relevant studies completed to date and  lasting 15 to 20 minutes of a 25 minute visit    Each maintenance medication was reviewed in detail including most importantly the difference between maintenance and prns  and under what circumstances the prns are to be triggered using an action plan format that is not reflected in the computer generated alphabetically organized AVS.    Please see instructions for details which were reviewed in writing and the patient given a copy highlighting the part that I personally wrote and discussed at today's ov.

## 2015-02-05 NOTE — Assessment & Plan Note (Signed)
Referred to primary care for f/u

## 2015-02-05 NOTE — Assessment & Plan Note (Signed)
10/31/2014 Procedure: Left video-assisted thoracoscopy, stapling of apical bleb, mechanical pleurodesis - post thoracotomy pain > rec trial of zostrix 01/31/2015

## 2015-02-14 ENCOUNTER — Ambulatory Visit: Payer: 59 | Admitting: Surgery

## 2015-02-22 ENCOUNTER — Telehealth: Payer: Self-pay | Admitting: Internal Medicine

## 2015-02-22 NOTE — Telephone Encounter (Signed)
Supposed to be on zostrix qid and if still having pain should make appt to see her surgeon for further eval

## 2015-02-22 NOTE — Telephone Encounter (Signed)
Spoke with pt. She is aware of MW's recommendation. Nothing further was needed. 

## 2015-02-22 NOTE — Telephone Encounter (Signed)
Spoke with Rob (son) requests something be called in for patient for pain at her incision site. Had recent lung surgery and is having some tenderness at the incision site. Denies any redness, itching, discharge. Please advise Dr Sherene Sires. Thanks.

## 2015-03-09 ENCOUNTER — Encounter: Payer: Self-pay | Admitting: Behavioral Health

## 2015-03-09 ENCOUNTER — Telehealth: Payer: Self-pay | Admitting: Behavioral Health

## 2015-03-09 NOTE — Telephone Encounter (Signed)
Pre-Visit Call completed with patient and chart updated.   Pre-Visit Info documented in Specialty Comments under Snap[employer].    

## 2015-03-12 ENCOUNTER — Encounter: Payer: Self-pay | Admitting: Internal Medicine

## 2015-03-12 ENCOUNTER — Ambulatory Visit (INDEPENDENT_AMBULATORY_CARE_PROVIDER_SITE_OTHER): Payer: Self-pay | Admitting: Internal Medicine

## 2015-03-12 VITALS — BP 114/74 | HR 85 | Temp 98.0°F | Ht 63.0 in | Wt 182.1 lb

## 2015-03-12 DIAGNOSIS — F17210 Nicotine dependence, cigarettes, uncomplicated: Secondary | ICD-10-CM | POA: Insufficient documentation

## 2015-03-12 DIAGNOSIS — Z720 Tobacco use: Secondary | ICD-10-CM

## 2015-03-12 DIAGNOSIS — F418 Other specified anxiety disorders: Secondary | ICD-10-CM | POA: Insufficient documentation

## 2015-03-12 DIAGNOSIS — R0782 Intercostal pain: Secondary | ICD-10-CM

## 2015-03-12 DIAGNOSIS — F411 Generalized anxiety disorder: Secondary | ICD-10-CM

## 2015-03-12 DIAGNOSIS — F419 Anxiety disorder, unspecified: Secondary | ICD-10-CM | POA: Insufficient documentation

## 2015-03-12 DIAGNOSIS — F17200 Nicotine dependence, unspecified, uncomplicated: Secondary | ICD-10-CM

## 2015-03-12 MED ORDER — ESCITALOPRAM OXALATE 10 MG PO TABS: 10.0000 mg | ORAL_TABLET | Freq: Every day | ORAL | Status: DC

## 2015-03-12 MED ORDER — LIDOCAINE 5 % EX PTCH: 1.0000 | MEDICATED_PATCH | CUTANEOUS | Status: DC

## 2015-03-12 MED ORDER — BUPROPION HCL 75 MG PO TABS: 75.0000 mg | ORAL_TABLET | Freq: Two times a day (BID) | ORAL | Status: DC

## 2015-03-12 NOTE — Patient Instructions (Signed)
Start Lexapro 10 mg one tablet every day  In 2 weeks start Wellbutrin: Take 1 tablet every night for one week, then 1 tablet twice a day. Do not quit Lexapro Quit tobacco in 1 month from today.  For pain continue with ibuprofen and Tylenol. Also one or 2 patches for 12 hours every day as needed.   Next visit here in 6 weeks

## 2015-03-12 NOTE — Progress Notes (Signed)
Subjective:    Patient ID: [name] [name], female    DOB: 03/09/1950, 65 y.o.   MRN: [id number]  DOS:  03/12/2015 Type of visit - description : New patient Interval history: Patient with history of COPD, status post VATS few months ago here as a new patient, her main concern is quit tobacco. Also she has been quite anxious  related to family issues and the fact that she is not working at this time. She continue with pain at the left side of the chest at the site of surgery. Currently on ibuprofen and Tylenol, stronger painkillers cause some nausea particularly at normal doses .    Review of Systems Cough is at minimal, with occasional wheezing. Shortness of breath at baseline No fever or chills  Past Medical History  Diagnosis Date  . COPD (chronic obstructive pulmonary disease) (HCC)   . Diverticulitis     Past Surgical History  Procedure Laterality Date  . Cesarean section    . Video assisted thoracoscopy Left 10/31/2014    Procedure: LEFT VIDEO ASSISTED THORACOSCOPY;  Surgeon: Alleen Borne, MD;  Location: [facility] OR;  Service: Thoracic;  Laterality: Left;  . Stapling of blebs Left 10/31/2014    Procedure: STAPLING OF BLEBS;  Surgeon: Alleen Borne, MD;  Location: [facility] OR;  Service: Thoracic;  Laterality: Left;    Social History   Social History  . Marital Status: Divorced    Spouse Name: N/A  . Number of Children: 1  . Years of Education: N/A   Occupational History  . not working currently    Social History Main Topics  . Smoking status: Current Every Day Smoker    Last Attempt to Quit: 10/26/2014  . Smokeless tobacco: Not on file     Comment: started age in her late teens; +/- 1 ppd  . Alcohol Use: No  . Drug Use: Not on file  . Sexual Activity: Not on file   Other Topics Concern  . Not on file   Social History Narrative   From [place] , father from [place]   Living in [place] since the 90s   Household- pt and son           Medication List       This list  is accurate as of: 03/12/15  7:14 PM.  Always use your most recent med list.               albuterol (2.5 MG/3ML) 0.083% nebulizer solution  Commonly known as:  PROVENTIL  Take 3 mLs (2.5 mg total) by nebulization every 4 (four) hours as needed for wheezing or shortness of breath.     albuterol 108 (90 Base) MCG/ACT inhaler  Commonly known as:  PROVENTIL HFA;VENTOLIN HFA  Inhale 1-2 puffs into the lungs every 6 (six) hours as needed for wheezing or shortness of breath.     budesonide-formoterol 160-4.5 MCG/ACT inhaler  Commonly known as:  SYMBICORT  Take 2 puffs first thing in am and then another 2 puffs about 12 hours later.     buPROPion 75 MG tablet  Commonly known as:  WELLBUTRIN  Take 1 tablet (75 mg total) by mouth 2 (two) times daily.     escitalopram 10 MG tablet  Commonly known as:  LEXAPRO  Take 1 tablet (10 mg total) by mouth daily.     ibuprofen 200 MG tablet  Commonly known as:  ADVIL,MOTRIN  Take 200 mg by mouth every 6 (six) hours as needed  for mild pain or moderate pain.     lidocaine 5 %  Commonly known as:  LIDODERM  Place 1-2 patches onto the skin daily. Remove & Discard patch within 12 hours or as directed by MD           Objective:   Physical Exam BP 114/74 mmHg  Pulse 85  Temp(Src) 98 F (36.7 C) (Oral)  Ht  (1.6 m)  Wt 182 lb 2 oz (82.611 kg)  BMI 32.27 kg/m2  SpO2 98% General:   Well developed, well nourished . NAD.  HEENT:  Normocephalic . Face symmetric, atraumatic Lungs:  Decreased breath sounds Normal respiratory effort, no intercostal retractions, no accessory muscle use. Heart: RRR,  no murmur.  No pretibial edema bilaterally  Skin: Not pale. Not jaundice Neurologic:  alert & oriented X3.  Speech normal, gait appropriate for age and unassisted Psych--  Cognition and judgment appear intact.  Cooperative with normal attention span and concentration.  Behavior appropriate. Mild to moderate anxious, not depressed appearing.       Assessment & Plan:   Assessment COPD gold III dx ~ 2013 Smoker S/p left VATS with apical bleb staping and mechanical pleurodesis on 10/31/2014.  Obesity   Plan: Smoker: Smoking since his teen age years, one pack-a-day. After her surgery 10-2014 she quit temporarily, now is back smoking a quarter pack a day. She is motivated to quit, needs help. Plan: Treat anxiety first with Lexapro, then start Wellbutrin. Ideal time to time quit will be a month when both medications are in her system however she likes to quit sooner, I don't disagree, rec to quit as soon as she is ready Anxiety: Related to family issues, start Lexapro. Post op chest  pain: Intolerant to painkillers, currently on Motrin and Tylenol. The pain is well localized on the left side of the chest, recommend a trial with Lidoderm patch RTC 6 weeks. ,

## 2015-03-12 NOTE — Progress Notes (Signed)
Pre visit review using our clinic review tool, if applicable. No additional management support is needed unless otherwise documented below in the visit note. 

## 2015-04-26 ENCOUNTER — Ambulatory Visit: Payer: Self-pay | Admitting: Internal Medicine

## 2015-05-03 ENCOUNTER — Ambulatory Visit (INDEPENDENT_AMBULATORY_CARE_PROVIDER_SITE_OTHER): Payer: Self-pay | Admitting: Internal Medicine

## 2015-05-03 ENCOUNTER — Encounter: Payer: Self-pay | Admitting: Internal Medicine

## 2015-05-03 ENCOUNTER — Telehealth: Payer: Self-pay | Admitting: Internal Medicine

## 2015-05-03 VITALS — BP 102/76 | HR 77 | Temp 98.2°F | Ht 63.0 in | Wt 186.0 lb

## 2015-05-03 DIAGNOSIS — F17200 Nicotine dependence, unspecified, uncomplicated: Secondary | ICD-10-CM

## 2015-05-03 DIAGNOSIS — Z720 Tobacco use: Secondary | ICD-10-CM

## 2015-05-03 DIAGNOSIS — F411 Generalized anxiety disorder: Secondary | ICD-10-CM

## 2015-05-03 DIAGNOSIS — Z09 Encounter for follow-up examination after completed treatment for conditions other than malignant neoplasm: Secondary | ICD-10-CM | POA: Insufficient documentation

## 2015-05-03 MED ORDER — ESCITALOPRAM OXALATE 10 MG PO TABS: 20.0000 mg | ORAL_TABLET | Freq: Every day | ORAL | Status: DC

## 2015-05-03 NOTE — Telephone Encounter (Signed)
Appointment scheduled for 07/05/15 at 11am.

## 2015-05-03 NOTE — Telephone Encounter (Signed)
Okay to use same day or two 15 minute appts.

## 2015-05-03 NOTE — Progress Notes (Signed)
Subjective:    Patient ID: [name] [name], female    DOB: [birth year], 65 y.o.   MRN: [id number]  DOS:  05/03/2015 Type of visit - description : Follow-up Interval history: Taking Lexapro as recommended, anxiety has definitely increased but she is not where she needs to be. Wellbutrin 2 tablets daily, "makes me jittery", decreased to 1 tablet daily and is a little better but not completely well. Post-op pain, much improved after alternating ibuprofen and Tylenol. Did not get to use the Lidoderm patch.    Review of Systems   Past Medical History  Diagnosis Date  . COPD (chronic obstructive pulmonary disease) (HCC)   . Diverticulitis     Past Surgical History  Procedure Laterality Date  . Cesarean section    . Video assisted thoracoscopy Left 10/31/2014    Procedure: LEFT VIDEO ASSISTED THORACOSCOPY;  Surgeon: Alleen Borne, MD;  Location: [facility] OR;  Service: Thoracic;  Laterality: Left;  . Stapling of blebs Left 10/31/2014    Procedure: STAPLING OF BLEBS;  Surgeon: Alleen Borne, MD;  Location: [facility] OR;  Service: Thoracic;  Laterality: Left;    Social History   Social History  . Marital Status: Divorced    Spouse Name: N/A  . Number of Children: 1  . Years of Education: N/A   Occupational History  . not working currently    Social History Main Topics  . Smoking status: Current Every Day Smoker    Last Attempt to Quit: 10/26/2014  . Smokeless tobacco: Not on file     Comment: started age in her late teens; +/- 1 ppd  . Alcohol Use: No  . Drug Use: Not on file  . Sexual Activity: Not on file   Other Topics Concern  . Not on file   Social History Narrative   From [place] , father from [place]   Living in [place] since the 90s   Household- pt and son           Medication List       This list is accurate as of: 05/03/15  5:02 PM.  Always use your most recent med list.               albuterol (2.5 MG/3ML) 0.083% nebulizer solution  Commonly known as:  PROVENTIL    Take 3 mLs (2.5 mg total) by nebulization every 4 (four) hours as needed for wheezing or shortness of breath.     albuterol 108 (90 Base) MCG/ACT inhaler  Commonly known as:  PROVENTIL HFA;VENTOLIN HFA  Inhale 1-2 puffs into the lungs every 6 (six) hours as needed for wheezing or shortness of breath.     budesonide-formoterol 160-4.5 MCG/ACT inhaler  Commonly known as:  SYMBICORT  Take 2 puffs first thing in am and then another 2 puffs about 12 hours later.     escitalopram 10 MG tablet  Commonly known as:  LEXAPRO  Take 2 tablets (20 mg total) by mouth daily.     ibuprofen 200 MG tablet  Commonly known as:  ADVIL,MOTRIN  Take 200 mg by mouth every 6 (six) hours as needed for mild pain or moderate pain.           Objective:   Physical Exam BP 102/76 mmHg  Pulse 77  Temp(Src) 98.2 F (36.8 C) (Oral)  Ht  (1.6 m)  Wt 186 lb (84.369 kg)  BMI 32.96 kg/m2  SpO2 96% General:   Well developed, well nourished . NAD.  HEENT:  Normocephalic . Face symmetric, atraumatic Lungs:  CTA B Normal respiratory effort, no intercostal retractions, no accessory muscle use. Heart: RRR,  no murmur.  No pretibial edema bilaterally  Skin: Not pale. Not jaundice Neurologic:  alert & oriented X3.  Speech normal, gait appropriate for age and unassisted Psych--  Cognition and judgment appear intact.  Cooperative with normal attention span and concentration.  Behavior appropriate. No anxious or depressed appearing.      Assessment & Plan:   Assessment Anxiety COPD gold III dx ~ 2013 Smoker S/p left VATS with apical bleb staping and mechanical pleurodesis on 10/31/2014.  Obesity   Plan: Smoker: Has decreased from 1 PPD to 1/3 PPD, motivated to keep working on it. We had a long discussion about options, she is intolerant to Wellbutrin, in the past Chantix caused insomnia. Tips provided, [facility], will try nicotine supplementation and optimize  anxiety therapy.  See below. Anxiety: Better but room for improvement. D/c Wellbutrin because makes her jittery. Will increase Lexapro to 20 mg daily,reasses in 2 months. Post-op pain: Much improved, did not get to use Lidoderm. RTC  2 months , CPX

## 2015-05-03 NOTE — Progress Notes (Signed)
Pre visit review using our clinic review tool, if applicable. No additional management support is needed unless otherwise documented below in the visit note. 

## 2015-05-03 NOTE — Patient Instructions (Addendum)
GO TO THE FRONT DESK  Schedule your next appointment for a  physical When?   2 months Fasting?  Yes    ---------------------- Use parches o gums de nicotina  visite la [employer] website para mas informacion  Tome escitalopram 10 mg: 2 pastillas al dia  regrese en 2 meses

## 2015-05-03 NOTE — Telephone Encounter (Signed)
Relation to OZ:HYQMpt:self Call back [phone] Pharmacy:  Reason for call:  Patient advised as per AVS Schedule physical in the next 2 months, there's no open physical slots ok to use 2 same day? Please advise

## 2015-05-03 NOTE — Assessment & Plan Note (Signed)
Smoker: Has decreased from 1 PPD to 1/3 PPD, motivated to keep working on it. We had a long discussion about options, she is intolerant to Wellbutrin, in the past Chantix caused insomnia. Tips provided, [facility] the [facility], will try nicotine supplementation and optimize  anxiety therapy. See below. Anxiety: Better but room for improvement. D/c Wellbutrin because makes her jittery. Will increase Lexapro to 20 mg daily,reasses in 2 months. Post-op pain: Much improved, did not get to use Lidoderm. RTC  2 months , CPX

## 2015-07-05 ENCOUNTER — Encounter: Payer: Self-pay | Admitting: Internal Medicine

## 2015-08-06 ENCOUNTER — Telehealth: Payer: Self-pay | Admitting: Internal Medicine

## 2015-08-06 NOTE — Telephone Encounter (Signed)
Pt's son dropped off aplplication for free [employer], need to be filled out by dr Drue Novelpaz and pt will pick up when ready. Documents placed in Jessica's tray at front office

## 2015-08-08 NOTE — Telephone Encounter (Signed)
Filled out as much as possible and forwarded to Dr. Paz. JG//CMA  

## 2015-08-10 NOTE — Telephone Encounter (Signed)
Called and informed pt that our part of the forms are completed and ready for pickup. She stated her son, [name] [name], will be by today or Monday to pick them up for her. Forms placed in bin up front. Copy of our part only sent for scanning. JG//CMA

## 2015-08-22 ENCOUNTER — Other Ambulatory Visit: Payer: Self-pay

## 2015-08-22 MED ORDER — BUDESONIDE-FORMOTEROL FUMARATE 160-4.5 MCG/ACT IN AERO: INHALATION_SPRAY | RESPIRATORY_TRACT | Status: DC

## 2015-08-22 NOTE — Telephone Encounter (Signed)
Received letter from [employer], application was missing Symbicort prescription. Rx printed, stamped w/ PCP signature and faxed to [phone]. Letter and Rx sent for scanning.

## 2015-08-23 ENCOUNTER — Telehealth: Payer: Self-pay | Admitting: Internal Medicine

## 2015-08-23 DIAGNOSIS — J449 Chronic obstructive pulmonary disease, unspecified: Secondary | ICD-10-CM

## 2015-08-23 MED ORDER — ALBUTEROL SULFATE (2.5 MG/3ML) 0.083% IN NEBU: 2.5000 mg | INHALATION_SOLUTION | RESPIRATORY_TRACT | Status: DC | PRN

## 2015-08-23 NOTE — Telephone Encounter (Signed)
Called pt's son to inform of the below. Provided him with the phone number to Pulmonary Dr. To request refill

## 2015-08-23 NOTE — Telephone Encounter (Signed)
Pt is under pulmonology care, Dr. Sherene SiresWert, she will need to contact them for a refill. She may need to schedule appt before they will give her refills.

## 2015-08-23 NOTE — Telephone Encounter (Signed)
Pt's spouse called in to request a refill request on nebulizer solution.    [facility] [place], [place] - [phone]

## 2015-08-23 NOTE — Telephone Encounter (Signed)
Patient calling to get refill on Albuterol Neb Med. Rx sent to pharmacy, patient advised to get future refills from Dr. Drue NovelPaz. Nothing further needed.

## 2015-08-24 ENCOUNTER — Other Ambulatory Visit: Payer: Self-pay | Admitting: Internal Medicine

## 2015-11-08 ENCOUNTER — Telehealth: Payer: Self-pay | Admitting: Internal Medicine

## 2015-11-08 ENCOUNTER — Encounter: Payer: Self-pay | Admitting: Internal Medicine

## 2015-11-08 ENCOUNTER — Ambulatory Visit (INDEPENDENT_AMBULATORY_CARE_PROVIDER_SITE_OTHER): Payer: Self-pay | Admitting: Internal Medicine

## 2015-11-08 ENCOUNTER — Ambulatory Visit (HOSPITAL_BASED_OUTPATIENT_CLINIC_OR_DEPARTMENT_OTHER)
Admission: RE | Admit: 2015-11-08 | Discharge: 2015-11-08 | Disposition: A | Discharge status: Home / Self Care | Payer: Self-pay | Source: Ambulatory Visit | Attending: Internal Medicine | Admitting: Internal Medicine

## 2015-11-08 VITALS — BP 118/62 | HR 69 | Temp 97.8°F | Resp 14 | Ht 63.0 in | Wt 183.1 lb

## 2015-11-08 DIAGNOSIS — M25512 Pain in left shoulder: Secondary | ICD-10-CM | POA: Insufficient documentation

## 2015-11-08 DIAGNOSIS — F411 Generalized anxiety disorder: Secondary | ICD-10-CM

## 2015-11-08 DIAGNOSIS — J449 Chronic obstructive pulmonary disease, unspecified: Secondary | ICD-10-CM | POA: Insufficient documentation

## 2015-11-08 DIAGNOSIS — F17200 Nicotine dependence, unspecified, uncomplicated: Secondary | ICD-10-CM

## 2015-11-08 MED ORDER — HYDROXYZINE HCL 10 MG PO TABS
10.0000 mg | ORAL_TABLET | Freq: Three times a day (TID) | ORAL | 1 refills | Status: DC | PRN
Start: 2015-11-08 — End: 2018-12-24

## 2015-11-08 NOTE — Assessment & Plan Note (Signed)
Shoulder pain: Seems a mechanical, shoulder joint pain. She has a history of pneumothorax and VATS. Will get a chest x-ray and shoulder x-ray. Refer to sports medicine, suspect rotator cuff issue. Smoker: Getting jittery with Wellbutrin, states she could take it if she has something for nerves, recommend to go back on Wellbutrin and try Atarax as needed Anxiety: Having atypical side effects from Lexapro, see HPI; states she can tolerate  "pams" like diazepam; we are starting Atarax. RTC 6 weeks, CPX. All instructions discussed in Spanish

## 2015-11-08 NOTE — Telephone Encounter (Signed)
thx

## 2015-11-08 NOTE — Patient Instructions (Addendum)
GO TO THE FRONT DESK Schedule your next appointment for a physical in 6 weeks      STOP BY THE FIRST FLOOR:  get the XR    regrese en 6 semanas  empieze bupropion Atarax para los nervios   tomese los rayos X  Tylenol para el dolor    La voy a mandar para [employer] Services operational officerel doctor de Sports medicine, lleve un traductor   llame si el dolor es severo o se pone peor

## 2015-11-08 NOTE — Telephone Encounter (Signed)
Per AVS pt is to schedule a cpe. Pt says that she doesn't have insurance so she dont  Want to schedule. Pt says that she will follow up with provider if she's not feeling better.

## 2015-11-08 NOTE — Progress Notes (Signed)
Subjective:    Patient ID: [name] [name], female    DOB: [birth year], 65 y.o.   MRN: [id number]  DOS:  11/08/2015 Type of visit - description : acute Interval history: Sx started ~ 1 week ago w/ pain around the L shoulder, L axilary area, worse w/ certain movements Particularly arm elevation. Denies any injury however symptoms worse after she did yard work. Denies any rash, no neck pain per se.  Also likes to discuss the following: Tobacco abuse, could not tolerate bupropion, felt "hyper" Anxiety, could not tolerate Lexapro, had visual disturbances that in her mind were clearly related to the medication.   Review of Systems No fever chills Shortness of breath slightly more than baseline? No lower extremity edema, occasional palpitations (at baseline) Cough at baseline, no hemoptysis.  Past Medical History:  Diagnosis Date  . COPD (chronic obstructive pulmonary disease) (HCC)   . Diverticulitis     Past Surgical History:  Procedure Laterality Date  . CESAREAN SECTION    . STAPLING OF BLEBS Left 10/31/2014   Procedure: STAPLING OF BLEBS;  Surgeon: Alleen Borne, MD;  Location: [facility] OR;  Service: Thoracic;  Laterality: Left;  Marland Kitchen VIDEO ASSISTED THORACOSCOPY Left 10/31/2014   Procedure: LEFT VIDEO ASSISTED THORACOSCOPY;  Surgeon: Alleen Borne, MD;  Location: [facility] OR;  Service: Thoracic;  Laterality: Left;    Social History   Social History  . Marital status: Divorced    Spouse name: N/A  . Number of children: 1  . Years of education: N/A   Occupational History  . not working currently    Social History Main Topics  . Smoking status: Current Every Day Smoker    Last attempt to quit: 10/26/2014  . Smokeless tobacco: Never Used     Comment: started age in her late teens; +/- 1 ppd  . Alcohol use No  . Drug use: Unknown  . Sexual activity: Not on file   Other Topics Concern  . Not on file   Social History Narrative   From [place] , father from [place]   Living in [place]  since the 90s   Household- pt and son           Medication List       Accurate as of 11/08/15  8:34 AM. Always use your most recent med list.          albuterol 108 (90 Base) MCG/ACT inhaler Commonly known as:  PROVENTIL HFA;VENTOLIN HFA Inhale 1-2 puffs into the lungs every 6 (six) hours as needed for wheezing or shortness of breath.   albuterol (2.5 MG/3ML) 0.083% nebulizer solution Commonly known as:  PROVENTIL Take 3 mLs (2.5 mg total) by nebulization every 4 (four) hours as needed for wheezing or shortness of breath.   budesonide-formoterol 160-4.5 MCG/ACT inhaler Commonly known as:  SYMBICORT Take 2 puffs first thing in am and then another 2 puffs about 12 hours later.   escitalopram 10 MG tablet Commonly known as:  LEXAPRO Take 2 tablets (20 mg total) by mouth daily.   ibuprofen 200 MG tablet Commonly known as:  ADVIL,MOTRIN Take 200 mg by mouth every 6 (six) hours as needed for mild pain or moderate pain.          Objective:   Physical Exam BP 118/62 (BP Location: Left Arm, Patient Position: Sitting, Cuff Size: Normal)   Pulse 69   Temp 97.8 F (36.6 C) (Oral)   Resp 14   Ht 5\' 3"  (1.6 m)  Wt 183 lb 2 oz (83.1 kg)   SpO2 96%   BMI 32.44 kg/m  General:   Well developed, well nourished . NAD.  HEENT:  Normocephalic . Face symmetric, atraumatic. Neck: Trachea midline, no LADs, no crepitus or mass Lungs:  Decreased breath sounds throughout Normal respiratory effort, no intercostal retractions, no accessory muscle use. Heart: RRR,  no murmur.  no pretibial edema bilaterally  Abdomen:  Not distended, soft, non-tender. No rebound or rigidity.  MSK: Right shoulder: Normal range of motion Left shoulder: Patient's pain is reproducible by arm elevation. Range of motion limited by pain Axillary areas: No TTP, rash, LAD, mass on either side Skin: Not pale. Not jaundice Neurologic:  alert & oriented X3.  Speech normal, gait appropriate for age and  unassisted Psych--  Cognition and judgment appear intact.  Cooperative with normal attention span and concentration.  Behavior appropriate. No anxious or depressed appearing.    Assessment & Plan:   Assessment Anxiety COPD gold III dx ~ 2013 Smoker: h/o chantix intolerance S/p left VATS with apical bleb staping and mechanical pleurodesis on 10/31/2014.  Obesity   PLAN: Shoulder pain: Seems a mechanical, shoulder joint pain. She has a history of pneumothorax and VATS. Will get a chest x-ray and shoulder x-ray. Refer to sports medicine, suspect rotator cuff issue. Smoker: Getting jittery with Wellbutrin, states she could take it if she has something for nerves, recommend to go back on Wellbutrin and try Atarax as needed Anxiety: Having atypical side effects from Lexapro, see HPI; states she can tolerate  "pams" like diazepam; we are starting Atarax. RTC 6 weeks, CPX. All instructions discussed in Spanish

## 2015-11-08 NOTE — Progress Notes (Signed)
Pre visit review using our clinic review tool, if applicable. No additional management support is needed unless otherwise documented below in the visit note. 

## 2015-11-08 NOTE — Telephone Encounter (Signed)
FYI

## 2015-11-12 ENCOUNTER — Telehealth: Payer: Self-pay | Admitting: Internal Medicine

## 2015-11-12 NOTE — Telephone Encounter (Signed)
Pt's son called in to request results for moms images. Informed him that they havent been reviewed by PCP. CMA will call with results once signed off on.

## 2015-11-19 ENCOUNTER — Encounter: Payer: Self-pay | Admitting: Family Medicine

## 2015-11-19 ENCOUNTER — Ambulatory Visit (INDEPENDENT_AMBULATORY_CARE_PROVIDER_SITE_OTHER): Payer: Self-pay | Admitting: Family Medicine

## 2015-11-19 DIAGNOSIS — M25512 Pain in left shoulder: Secondary | ICD-10-CM

## 2015-11-19 NOTE — Patient Instructions (Signed)
You have a frozen shoulder (adhesive capsulitis), a buildup of scar tissue that limits motion of the shoulder joint. Limit lifting and overhead activities as much as possible. Heat 15 minutes at a time 3-4 times a day may help with movement and stiffness. Tylenol and/or aleve as needed for pain and inflammation. Steroid injections in a series have been shown to help with pain and motion - you were given this today. Codman exercises (pendulum, wall walking or table slides, arm circles) - do 3 sets of 10 once or twice a day. Physical therapy for rotator cuff strengthening is a consideration once you are out of the painful phase Follow up in 6 weeks for reevaluation.

## 2015-11-22 DIAGNOSIS — M25512 Pain in left shoulder: Secondary | ICD-10-CM | POA: Insufficient documentation

## 2015-11-22 NOTE — Progress Notes (Signed)
PCP and consultation requested by: Willow OraJose Paz, MD  Subjective:   HPI: Patient is a 65 y.o. female here for left shoulder pain.  Patient reports she's had about 2 months of pain in left shoulder. Pain is 0/10 at rest but up to 9/10 and sharp with movements. Radiates from anterior to posterior shoulder. Tried ibuprofen, tylenol, heat (electric blanket). No skin changes, numbness.  Past Medical History:  Diagnosis Date  . COPD (chronic obstructive pulmonary disease) (HCC)   . Diverticulitis     Current Outpatient Prescriptions on File Prior to Visit  Medication Sig Dispense Refill  . albuterol (PROVENTIL HFA;VENTOLIN HFA) 108 (90 BASE) MCG/ACT inhaler Inhale 1-2 puffs into the lungs every 6 (six) hours as needed for wheezing or shortness of breath. (Patient not taking: Reported on 11/08/2015) 1 Inhaler 11  . albuterol (PROVENTIL) (2.5 MG/3ML) 0.083% nebulizer solution Take 3 mLs (2.5 mg total) by nebulization every 4 (four) hours as needed for wheezing or shortness of breath. 75 mL 12  . budesonide-formoterol (SYMBICORT) 160-4.5 MCG/ACT inhaler Take 2 puffs first thing in am and then another 2 puffs about 12 hours later. 3 Inhaler 3  . buPROPion (WELLBUTRIN) 75 MG tablet Take 75 mg by mouth 2 (two) times daily.    . hydrOXYzine (ATARAX/VISTARIL) 10 MG tablet Take 1 tablet (10 mg total) by mouth 3 (three) times daily as needed. 40 tablet 1  . ibuprofen (ADVIL,MOTRIN) 200 MG tablet Take 200 mg by mouth every 6 (six) hours as needed for mild pain or moderate pain.     No current facility-administered medications on file prior to visit.     Past Surgical History:  Procedure Laterality Date  . CESAREAN SECTION    . STAPLING OF BLEBS Left 10/31/2014   Procedure: STAPLING OF BLEBS;  Surgeon: Alleen BorneBryan K Bartle, MD;  Location: [facility] OR;  Service: Thoracic;  Laterality: Left;  Marland Kitchen. VIDEO ASSISTED THORACOSCOPY Left 10/31/2014   Procedure: LEFT VIDEO ASSISTED THORACOSCOPY;  Surgeon: Alleen BorneBryan K Bartle, MD;   Location: [facility];  Service: Thoracic;  Laterality: Left;    No Known Allergies  Social History   Social History  . Marital status: Divorced    Spouse name: N/A  . Number of children: 1  . Years of education: N/A   Occupational History  . not working currently    Social History Main Topics  . Smoking status: Current Every Day Smoker    Last attempt to quit: 10/26/2014  . Smokeless tobacco: Never Used     Comment: started age in her late teens; +/- 1 ppd  . Alcohol use No  . Drug use: Unknown  . Sexual activity: Not on file   Other Topics Concern  . Not on file   Social History Narrative   From [place] , father from [place]   Living in [place] since the 90s   Household- pt and son       Family History  Problem Relation Age of Onset  . Lung cancer Father   . Lung cancer Brother     type?  . Colon cancer Neg Hx   . Breast cancer Neg Hx   . CAD Neg Hx   . Diabetes Neg Hx     BP 126/83   Pulse 73   Ht 5\' 6"  (1.676 m)   Wt 180 lb (81.6 kg)   BMI 29.05 kg/m   Review of Systems: See HPI above.    Objective:  Physical Exam:  Gen: NAD, comfortable in  exam room  Left shoulder: No swelling, ecchymoses.  No gross deformity. TTP anterior and posterior shoulder over glenohumeral joint. ROM lacks 15 degrees ER compared to right.  Abducts only to 90 degrees, flexion to 150 degrees. Pain with Ananias Pilgrim. Negative Yergasons. Strength 5/5 with empty can and resisted internal/external rotation. NV intact distally.  Right shoulder: FROM without pain.    Assessment & Plan:  1. Left shoulder pain - decreased motion including with passive motions consistent with adhesive capsulitis.  Strength is good.  Shown home exercises to do daily.  She opted for intraarticular cortisone injection which was given today.  Tylenol or aleve if needed.  Heat.  F/u in 6 weeks.  After informed written consent, patient was seated on exam table. Left shoulder was prepped with alcohol swab  and utilizing posterior approach, patient's left glenohumeral space was injected with 3:1 marcaine: depomedrol. Patient tolerated the procedure well without immediate complications.

## 2015-11-22 NOTE — Assessment & Plan Note (Signed)
decreased motion including with passive motions consistent with adhesive capsulitis.  Strength is good.  Shown home exercises to do daily.  She opted for intraarticular cortisone injection which was given today.  Tylenol or aleve if needed.  Heat.  F/u in 6 weeks.  After informed written consent, patient was seated on exam table. Left shoulder was prepped with alcohol swab and utilizing posterior approach, patient's left glenohumeral space was injected with 3:1 marcaine: depomedrol. Patient tolerated the procedure well without immediate complications.

## 2015-12-03 ENCOUNTER — Emergency Department (HOSPITAL_COMMUNITY)
Admission: EM | Admit: 2015-12-03 | Discharge: 2015-12-03 | Disposition: A | Discharge status: Home / Self Care | Payer: Self-pay | Attending: Emergency Medicine | Admitting: Emergency Medicine

## 2015-12-03 ENCOUNTER — Emergency Department (HOSPITAL_COMMUNITY): Payer: Self-pay

## 2015-12-03 DIAGNOSIS — J449 Chronic obstructive pulmonary disease, unspecified: Secondary | ICD-10-CM | POA: Insufficient documentation

## 2015-12-03 DIAGNOSIS — F17200 Nicotine dependence, unspecified, uncomplicated: Secondary | ICD-10-CM | POA: Insufficient documentation

## 2015-12-03 DIAGNOSIS — H538 Other visual disturbances: Secondary | ICD-10-CM | POA: Insufficient documentation

## 2015-12-03 LAB — CBC WITH DIFFERENTIAL/PLATELET
BASOS ABS: 0 10*3/uL (ref 0.0–0.1)
Basophils Relative: 0 %
EOS PCT: 1 %
Eosinophils Absolute: 0.1 10*3/uL (ref 0.0–0.7)
HEMATOCRIT: 41.7 % (ref 36.0–46.0)
Hemoglobin: 14.6 g/dL (ref 12.0–15.0)
LYMPHS ABS: 1.6 10*3/uL (ref 0.7–4.0)
LYMPHS PCT: 20 %
MCH: 29.6 pg (ref 26.0–34.0)
MCHC: 35 g/dL (ref 30.0–36.0)
MCV: 84.4 fL (ref 78.0–100.0)
MONO ABS: 0.4 10*3/uL (ref 0.1–1.0)
MONOS PCT: 4 %
NEUTROS ABS: 5.9 10*3/uL (ref 1.7–7.7)
Neutrophils Relative %: 75 %
PLATELETS: 263 10*3/uL (ref 150–400)
RBC: 4.94 MIL/uL (ref 3.87–5.11)
RDW: 13 % (ref 11.5–15.5)
WBC: 7.9 10*3/uL (ref 4.0–10.5)

## 2015-12-03 LAB — COMPREHENSIVE METABOLIC PANEL
ALT: 13 U/L — ABNORMAL LOW (ref 14–54)
ANION GAP: 9 (ref 5–15)
AST: 23 U/L (ref 15–41)
Albumin: 4.1 g/dL (ref 3.5–5.0)
Alkaline Phosphatase: 89 U/L (ref 38–126)
BILIRUBIN TOTAL: 0.9 mg/dL (ref 0.3–1.2)
BUN: 17 mg/dL (ref 6–20)
CO2: 27 mmol/L (ref 22–32)
Calcium: 9.6 mg/dL (ref 8.9–10.3)
Chloride: 104 mmol/L (ref 101–111)
Creatinine, Ser: 0.75 mg/dL (ref 0.44–1.00)
Glucose, Bld: 108 mg/dL — ABNORMAL HIGH (ref 65–99)
POTASSIUM: 3.9 mmol/L (ref 3.5–5.1)
Sodium: 140 mmol/L (ref 135–145)
TOTAL PROTEIN: 7.4 g/dL (ref 6.5–8.1)

## 2015-12-03 LAB — I-STAT TROPONIN, ED: TROPONIN I, POC: 0 ng/mL (ref 0.00–0.08)

## 2015-12-03 NOTE — Discharge Instructions (Signed)
Take an aspirin daily.  You have an appointment with [facility] (information listed) on Wednesday 11/01 at 8:00. You will need to arrive at 7:45 and bring an updated medication list. Your appointment is with Dr. Lucia GaskinsAhern. You will also need to call the eye doctor listed to schedule a follow up appointment.  It is very important that you return to the ER immediately for returning blurred vision, slurred speech, difficulty walking, new or worsening symptoms, any additional concerns.

## 2015-12-03 NOTE — ED Notes (Signed)
Patient and family member reported patient didn't want further testing.

## 2015-12-03 NOTE — ED Provider Notes (Signed)
[facility]-EMERGENCY DEPT Provider Note   CSN: [id number] Arrival date & time: 12/03/15  1107     History   Chief Complaint Chief Complaint  Patient presents with  . Blurred Vision  . Eye Pain    HPI [name] [name] is a 65 y.o. female.  The history is provided by the patient and medical records. No language interpreter was used.   [name] [name] is a 65 y.o. female  with a PMH of COPD who presents to the Emergency Department complaining of blurred vision in the left eye only that began this morning and lasted approximately 30 minutes. Associated symptoms include dizziness described as an unsteady feeling. She also endorses generalized headache. She had a similar episode approximately 3 months ago which self resolved in 30 minutes. She had another similar episode yesterday, again lasting only 30 minutes. She has taken no medications for symptoms. She denies slurred speech, gait abnormalities, chest pain, shortness breath, loss of vision, photophobia, abdominal pain, nausea, vomiting, fevers. + current every day smoker. Not on blood thinners. Denies cardiac history.  PCP: [name] [name]  Past Medical History:  Diagnosis Date  . COPD (chronic obstructive pulmonary disease) (HCC)   . Diverticulitis     Patient Active Problem List   Diagnosis Date Noted  . Left shoulder pain 11/22/2015  . PCP NOTE>>>>>>>>>>>>>>>>>>>>>>>>>>>> 05/03/2015  . Anxiety state 03/12/2015  . Smoker 03/12/2015  . COPD GOLD III 11/11/2014  . Obesity (BMI 30-39.9) 11/11/2014  . Pneumothorax, spontaneous, tension 10/26/2014    Past Surgical History:  Procedure Laterality Date  . CESAREAN SECTION    . STAPLING OF BLEBS Left 10/31/2014   Procedure: STAPLING OF BLEBS;  Surgeon: Alleen Borne, MD;  Location: [facility] OR;  Service: Thoracic;  Laterality: Left;  [name] [name] VIDEO ASSISTED THORACOSCOPY Left 10/31/2014   Procedure: LEFT VIDEO ASSISTED THORACOSCOPY;  Surgeon: Alleen Borne, MD;  Location: [facility] OR;  Service: Thoracic;   Laterality: Left;    OB History    No data available       Home Medications    Prior to Admission medications   Medication Sig Start Date End Date Taking? Authorizing Provider  acetaminophen (TYLENOL) 500 MG tablet Take 1,000 mg by mouth every 6 (six) hours as needed for moderate pain.   Yes Historical Provider, MD  albuterol (PROVENTIL HFA;VENTOLIN HFA) 108 (90 BASE) MCG/ACT inhaler Inhale 1-2 puffs into the lungs every 6 (six) hours as needed for wheezing or shortness of breath. 11/24/14  Yes [name] Cowden, MD  albuterol (PROVENTIL) (2.5 MG/3ML) 0.083% nebulizer solution Take 3 mLs (2.5 mg total) by nebulization every 4 (four) hours as needed for wheezing or shortness of breath. 08/23/15  Yes [name] Cowden, MD  budesonide-formoterol [facility]) 160-4.5 MCG/ACT inhaler Take 2 puffs first thing in am and then another 2 puffs about 12 hours later. 08/22/15  Yes [name] Plump, MD  buPROPion (WELLBUTRIN) 75 MG tablet Take 75 mg by mouth 2 (two) times daily.   Yes Historical Provider, MD  hydrOXYzine (ATARAX/VISTARIL) 10 MG tablet Take 1 tablet (10 mg total) by mouth 3 (three) times daily as needed. 11/08/15  Yes [name] Plump, MD  pseudoephedrine (SUDAFED) 30 MG tablet Take 30 mg by mouth every 4 (four) hours as needed for congestion.   Yes Historical Provider, MD    Family History Family History  Problem Relation Age of Onset  . Lung cancer Father   . Lung cancer Brother     type?  [name] [name]  Colon cancer Neg Hx   . Breast cancer Neg Hx   . CAD Neg Hx   . Diabetes Neg Hx     Social History Social History  Substance Use Topics  . Smoking status: Current Every Day Smoker    Last attempt to quit: 10/26/2014  . Smokeless tobacco: Never Used     Comment: started age in her late teens; +/- 1 ppd  . Alcohol use No     Allergies   Review of patient's allergies indicates no known allergies.   Review of Systems Review of Systems  Constitutional: Negative for chills and fever.  HENT: Negative  for congestion.   Eyes: Positive for visual disturbance. Negative for photophobia, pain and redness.  Respiratory: Negative for cough and shortness of breath.   Cardiovascular: Negative.   Gastrointestinal: Negative for abdominal pain, nausea and vomiting.  Genitourinary: Negative for dysuria.  Musculoskeletal: Negative for back pain and neck pain.  Skin: Negative for rash.  Neurological: Positive for dizziness and headaches. Negative for syncope, speech difficulty, weakness and numbness.     Physical Exam Updated Vital Signs BP 128/88 (BP Location: Left Arm)   Pulse 86   Temp 98.2 F (36.8 C) (Oral)   Resp 18   Ht 5\' 5"  (1.651 m)   Wt 81.6 kg   SpO2 95%   BMI 29.95 kg/m   Physical Exam  Constitutional: She is oriented to person, place, and time. She appears well-developed and well-nourished. No distress.  HENT:  Head: Normocephalic and atraumatic.  Right Ear: Hearing, tympanic membrane and ear canal normal.  Left Ear: Hearing, tympanic membrane and ear canal normal.  Cardiovascular: Normal rate, regular rhythm, normal heart sounds and intact distal pulses.   No murmur heard. Pulmonary/Chest: Effort normal and breath sounds normal. No respiratory distress.  Abdominal: Soft. She exhibits no distension. There is no tenderness.  Musculoskeletal: Normal range of motion.  Neurological: She is alert and oriented to person, place, and time.  Alert, oriented, thought content appropriate, able to give a coherent history. Speech is clear and goal oriented, able to follow commands.  Cranial Nerves:  II:  Peripheral visual fields grossly normal, pupils equal, round, reactive to light III, IV, VI: EOM intact bilaterally, ptosis not present V,VII: smile symmetric, eyes kept closed tightly against resistance, facial light touch sensation equal VIII: hearing grossly normal IX, X: symmetric soft palate movement, uvula elevates symmetrically  XI: bilateral shoulder shrug symmetric and  strong XII: midline tongue extension 5/5 muscle strength in upper and lower extremities bilaterally including strong and equal grip strength and dorsiflexion/plantar flexion Sensory to light touch normal in all four extremities.  Normal finger-to-nose and rapid alternating movements; normal gait and balance. Negative romberg, no pronator drift.  Skin: Skin is warm and dry.  Nursing note and vitals reviewed.    ED Treatments / Results  Labs (all labs ordered are listed, but only abnormal results are displayed) Labs Reviewed  COMPREHENSIVE METABOLIC PANEL - Abnormal; Notable for the following:       Result Value   Glucose, Bld 108 (*)    ALT 13 (*)    All other components within normal limits  CBC WITH DIFFERENTIAL/PLATELET  I-STAT TROPOININ, ED    EKG  EKG Interpretation None       Radiology Ct Head Wo Contrast  Result Date: 12/03/2015 CLINICAL DATA:  Blurry vision beginning yesterday. Bitemporal and occipital headache. History of COPD. EXAM: CT HEAD WITHOUT CONTRAST TECHNIQUE: Contiguous axial images were obtained  from the base of the skull through the vertex without intravenous contrast. COMPARISON:  None. FINDINGS: BRAIN: The ventricles and sulci are normal for age. No intraparenchymal hemorrhage, mass effect nor midline shift. Minimal supratentorial white matter hypodensities within normal range for patient's age, though non-specific are most compatible with chronic small vessel ischemic disease. No acute large vascular territory infarcts. No abnormal extra-axial fluid collections. Basal cisterns are patent. VASCULAR: Minimal calcific atherosclerosis of the carotid siphons. SKULL: No skull fracture. No significant scalp soft tissue swelling. Moderate temporomandibular osteoarthrosis. SINUSES/ORBITS: The mastoid air-cells and included paranasal sinuses are well-aerated.The included ocular globes and orbital contents are non-suspicious. OTHER: None. IMPRESSION: Normal CT HEAD for  age. Electronically Signed   By: Awilda Metroourtnay  Bloomer M.D.   On: 12/03/2015 14:13    Procedures Procedures (including critical care time)  Medications Ordered in ED Medications - No data to display   Initial Impression / Assessment and Plan / ED Course  I have reviewed the triage vital signs and the nursing notes.  Pertinent labs & imaging results that were available during my care of the patient were reviewed by me and considered in my medical decision making (see chart for details).  Clinical Course   Namon CirriCarmen N Wysong is a 65 y.o. female who presents to ED for an episode of left eye blurred vision associated with dizziness described as an unsteady feeling and headache. Symptoms lasted approximately 30 minutes and have since resolved. She had a similar episode 3 months ago and yesterday. She has no focal neuro deficits on exam. Labs reassuring. CT head negative. I highly recommended that patient be admitted for TIA workup, however patient does not want to be admitted. I had a long conversation with her about the risks and benefits of admission vs. Home. We also discussed option of staying for CT angio head/neck and again, patient stated that she did not want to stay for further imaging. I have discussed my concerns as a provider and the possibility that her symptoms today were concerning for TIA, putting her at risk for having a stroke in the near future. I have specifically discussed that without further evaluation I cannot guarantee there is not a life threatening event occuring. Time was given to allow the opportunity to ask questions and consider the options, and after the discussion, the patient decided to refuse any further imaging or admission. Pt is A&Ox4, her own POA and states understanding of my concerns and the possible consequences. After refusal, I made every reasonable opportunity to treat them to the best of my ability. I have made the patient aware that this is an AMA discharge, but she  may return at any time for further evaluation and treatment. I called [facility] and have scheduled her an appointment with neurology the day after tomorrow (11/01) at 8am. She is aware of this follow up appointment and agrees to follow up. Reasons to return to the ER immediately were discussed at length as well. All questions were answered.   Patient discussed with Dr. Clayborne DanaMesner who agrees with treatment plan.    Final Clinical Impressions(s) / ED Diagnoses   Final diagnoses:  Blurred vision, left eye    New Prescriptions Discharge Medication List as of 12/03/2015  3:28 PM       Chase PicketJaime Pilcher Ward, PA-C 12/03/15 1553    Marily MemosJason Mesner, MD 12/03/15 1609

## 2015-12-03 NOTE — ED Triage Notes (Signed)
Pt reports blurred vision bilaterally and burning to right eye x3 months. Pt states blurred vision increased yesterday. Pt also reports headache with nasal congestion. Denies chest pain, SOB, weakness.

## 2015-12-03 NOTE — Progress Notes (Signed)
Pt confirms Dr Drue NovelPAz as One[facility] Hurtpcp

## 2015-12-05 ENCOUNTER — Ambulatory Visit: Payer: Self-pay | Admitting: Neurology

## 2015-12-17 ENCOUNTER — Inpatient Hospital Stay (HOSPITAL_COMMUNITY)
Admission: EM | Admit: 2015-12-17 | Discharge: 2015-12-19 | DRG: 065 | Disposition: A | Discharge status: Home / Self Care | Payer: Self-pay | Attending: Internal Medicine | Admitting: Internal Medicine

## 2015-12-17 ENCOUNTER — Encounter (HOSPITAL_COMMUNITY): Payer: Self-pay | Admitting: Emergency Medicine

## 2015-12-17 ENCOUNTER — Emergency Department (HOSPITAL_COMMUNITY): Payer: Self-pay

## 2015-12-17 DIAGNOSIS — I739 Peripheral vascular disease, unspecified: Secondary | ICD-10-CM | POA: Diagnosis present

## 2015-12-17 DIAGNOSIS — R4701 Aphasia: Secondary | ICD-10-CM | POA: Diagnosis present

## 2015-12-17 DIAGNOSIS — Z79899 Other long term (current) drug therapy: Secondary | ICD-10-CM

## 2015-12-17 DIAGNOSIS — Z720 Tobacco use: Secondary | ICD-10-CM | POA: Diagnosis present

## 2015-12-17 DIAGNOSIS — J449 Chronic obstructive pulmonary disease, unspecified: Secondary | ICD-10-CM | POA: Diagnosis present

## 2015-12-17 DIAGNOSIS — I6340 Cerebral infarction due to embolism of unspecified cerebral artery: Principal | ICD-10-CM | POA: Diagnosis present

## 2015-12-17 DIAGNOSIS — Z6832 Body mass index (BMI) 32.0-32.9, adult: Secondary | ICD-10-CM

## 2015-12-17 DIAGNOSIS — Z7982 Long term (current) use of aspirin: Secondary | ICD-10-CM

## 2015-12-17 DIAGNOSIS — H532 Diplopia: Secondary | ICD-10-CM | POA: Diagnosis present

## 2015-12-17 DIAGNOSIS — G459 Transient cerebral ischemic attack, unspecified: Secondary | ICD-10-CM

## 2015-12-17 DIAGNOSIS — E669 Obesity, unspecified: Secondary | ICD-10-CM | POA: Diagnosis present

## 2015-12-17 DIAGNOSIS — Z23 Encounter for immunization: Secondary | ICD-10-CM

## 2015-12-17 DIAGNOSIS — R4781 Slurred speech: Secondary | ICD-10-CM | POA: Diagnosis present

## 2015-12-17 DIAGNOSIS — I639 Cerebral infarction, unspecified: Secondary | ICD-10-CM

## 2015-12-17 DIAGNOSIS — G8191 Hemiplegia, unspecified affecting right dominant side: Secondary | ICD-10-CM | POA: Diagnosis present

## 2015-12-17 DIAGNOSIS — F17210 Nicotine dependence, cigarettes, uncomplicated: Secondary | ICD-10-CM | POA: Diagnosis present

## 2015-12-17 DIAGNOSIS — Z7951 Long term (current) use of inhaled steroids: Secondary | ICD-10-CM

## 2015-12-17 DIAGNOSIS — I10 Essential (primary) hypertension: Secondary | ICD-10-CM | POA: Diagnosis present

## 2015-12-17 LAB — COMPREHENSIVE METABOLIC PANEL
ALBUMIN: 4.3 g/dL (ref 3.5–5.0)
ALT: 17 U/L (ref 14–54)
AST: 19 U/L (ref 15–41)
Alkaline Phosphatase: 86 U/L (ref 38–126)
Anion gap: 9 (ref 5–15)
BUN: 25 mg/dL — AB (ref 6–20)
CHLORIDE: 105 mmol/L (ref 101–111)
CO2: 25 mmol/L (ref 22–32)
CREATININE: 0.66 mg/dL (ref 0.44–1.00)
Calcium: 9.8 mg/dL (ref 8.9–10.3)
GFR calc Af Amer: >60 mL/min (ref >60)
GFR calc non Af Amer: >60 mL/min (ref >60)
GLUCOSE: 97 mg/dL (ref 65–99)
POTASSIUM: 3.8 mmol/L (ref 3.5–5.1)
Sodium: 139 mmol/L (ref 135–145)
Total Bilirubin: 0.7 mg/dL (ref 0.3–1.2)
Total Protein: 7.9 g/dL (ref 6.5–8.1)

## 2015-12-17 LAB — I-STAT TROPONIN, ED: Troponin i, poc: 0 ng/mL (ref 0.00–0.08)

## 2015-12-17 LAB — DIFFERENTIAL
BASOS ABS: 0 10*3/uL (ref 0.0–0.1)
BASOS PCT: 1 %
EOS ABS: 0.1 10*3/uL (ref 0.0–0.7)
Eosinophils Relative: 1 %
LYMPHS ABS: 1.9 10*3/uL (ref 0.7–4.0)
Lymphocytes Relative: 23 %
Monocytes Absolute: 0.4 10*3/uL (ref 0.1–1.0)
Monocytes Relative: 5 %
NEUTROS ABS: 5.8 10*3/uL (ref 1.7–7.7)
NEUTROS PCT: 70 %

## 2015-12-17 LAB — I-STAT CHEM 8, ED
BUN: 25 mg/dL — AB (ref 6–20)
CHLORIDE: 104 mmol/L (ref 101–111)
CREATININE: 0.7 mg/dL (ref 0.44–1.00)
Calcium, Ion: 1.21 mmol/L (ref 1.15–1.40)
Glucose, Bld: 94 mg/dL (ref 65–99)
HEMATOCRIT: 44 % (ref 36.0–46.0)
HEMOGLOBIN: 15 g/dL (ref 12.0–15.0)
POTASSIUM: 3.9 mmol/L (ref 3.5–5.1)
Sodium: 140 mmol/L (ref 135–145)
TCO2: 26 mmol/L (ref 0–100)

## 2015-12-17 LAB — CBG MONITORING, ED: Glucose-Capillary: 98 mg/dL (ref 65–99)

## 2015-12-17 LAB — PROTIME-INR
INR: 0.92
Prothrombin Time: 12.3 seconds (ref 11.4–15.2)

## 2015-12-17 LAB — APTT: APTT: 28 s (ref 24–36)

## 2015-12-17 LAB — CBC
HEMATOCRIT: 41.7 % (ref 36.0–46.0)
HEMOGLOBIN: 14.6 g/dL (ref 12.0–15.0)
MCH: 29.4 pg (ref 26.0–34.0)
MCHC: 35 g/dL (ref 30.0–36.0)
MCV: 84.1 fL (ref 78.0–100.0)
Platelets: 284 10*3/uL (ref 150–400)
RBC: 4.96 MIL/uL (ref 3.87–5.11)
RDW: 13 % (ref 11.5–15.5)
WBC: 8.2 10*3/uL (ref 4.0–10.5)

## 2015-12-17 MED ORDER — ALBUTEROL SULFATE (2.5 MG/3ML) 0.083% IN NEBU
2.5000 mg | INHALATION_SOLUTION | RESPIRATORY_TRACT | Status: DC | PRN
  Administered 2015-12-17 – 2015-12-19 (×2): 2.5 mg via RESPIRATORY_TRACT
  Filled 2015-12-17 (×2): qty 3

## 2015-12-17 MED ORDER — ACETAMINOPHEN 325 MG PO TABS
650.0000 mg | ORAL_TABLET | Freq: Once | ORAL | Status: AC
  Administered 2015-12-17: 650 mg via ORAL
  Filled 2015-12-17: qty 2

## 2015-12-17 MED ORDER — INFLUENZA VAC SPLIT QUAD 0.5 ML IM SUSY: 0.5000 mL | PREFILLED_SYRINGE | INTRAMUSCULAR | Status: DC

## 2015-12-17 NOTE — ED Notes (Signed)
Patient ambulated to bathroom without assistance and with steady gait.

## 2015-12-17 NOTE — ED Notes (Signed)
Patient transported to MRI 

## 2015-12-17 NOTE — ED Notes (Signed)
Nurse is in the room attempting to collect labs 

## 2015-12-17 NOTE — H&P (Signed)
[name] [name] NFA:[id number] DOB: 04/08/1950 DOA: 12/17/2015     PCP: Willow OraJose Paz, MD   Outpatient Specialists:none Patient coming from:  home Lives alone,        Chief Complaint: Double vision and slurred speech  HPI:  [name] [name] is a 65 y.o. female with medical history significant of COPD tobacco abuse    Presented with double vision starting when she woke up at 5 AM. Associated with slurred speech she is unsure but possibly a right arm numbness and weakness. She felt like her tongue was numb   She was seen in ER on 10/30 with episodes of double  Vision or blurred vision for past 6 months CT scan of the head was negative. Patient states she understood it was caused by cataract and was discharged to home. Symptoms only lasted 30 min at that time. Yesterday she developed double vision since she woke up this AM it was also associated with slurred speech and numbness of her tongue. She felt generalized weakness. No trouble walking. She has associated headache in the back of her head.  denies any nausea no voting no chest pain  Patient had remote pneumothorax. She continues to smoke. Now has known diagnosis of COPD ports taking her medications. Patient has been taking full dose of aspirin states because I was told to do so she is unsure about the cause  IN ER:  Temp (24hrs), Avg:97.9 F (36.6 C), Min:97.6 F (36.4 C), Max:98.2 F (36.8 C)      RR 19 HR 88 BP 149/91 Sodium 140 BUN 25 creatinine 0.7 hemoglobin 15 troponin 0.0  CT head unremarkable  MRI shows punctate acute ischemia within the left thalamus  Following Medications were ordered in ER: Medications  acetaminophen (TYLENOL) tablet 650 mg (650 mg Oral Given 12/17/15 1750)     ER provider discussed case with: Neurology Dr.Linzen  who [facility]  Hospitalist was called for admission for CVA  Review of Systems:    Pertinent positives include: double vision, slurred speech, Right side  weakness  Constitutional:  No weight loss, night sweats, Fevers, chills, fatigue, weight loss  HEENT:  No headaches, Difficulty swallowing,Tooth/dental problems,Sore throat,  No sneezing, itching, ear ache, nasal congestion, post nasal drip,  Cardio-vascular:  No chest pain, Orthopnea, PND, anasarca, dizziness, palpitations.no Bilateral lower extremity swelling  GI:  No heartburn, indigestion, abdominal pain, nausea, vomiting, diarrhea, change in bowel habits, loss of appetite, melena, blood in stool, hematemesis Resp:  no shortness of breath at rest. No dyspnea on exertion, No excess mucus, no productive cough, No non-productive cough, No coughing up of blood.No change in color of mucus.No wheezing. Skin:  no rash or lesions. No jaundice GU:  no dysuria, change in color of urine, no urgency or frequency. No straining to urinate.  No flank pain.  Musculoskeletal:  No joint pain or no joint swelling. No decreased range of motion. No back pain.  Psych:  No change in mood or affect. No depression or anxiety. No memory loss.  Neuro:  no tingling,   no gait abnormality, no , no confusion  As per HPI otherwise 10 point review of systems negative.   Past Medical History: Past Medical History:  Diagnosis Date  . COPD (chronic obstructive pulmonary disease) (HCC)   . Diverticulitis    Past Surgical History:  Procedure Laterality Date  . CESAREAN SECTION    . STAPLING OF BLEBS Left 10/31/2014   Procedure:  STAPLING OF BLEBS;  Surgeon: Alleen BorneBryan K Bartle, MD;  Location: [facility] OR;  Service: Thoracic;  Laterality: Left;  Marland Kitchen. VIDEO ASSISTED THORACOSCOPY Left 10/31/2014   Procedure: LEFT VIDEO ASSISTED THORACOSCOPY;  Surgeon: Alleen BorneBryan K Bartle, MD;  Location: [facility] OR;  Service: Thoracic;  Laterality: Left;     Social History:  Ambulatory   independently      reports that she has been smoking.  She has never used smokeless tobacco. She reports that she does not drink alcohol or use  drugs.  Allergies:  No Known Allergies     Family History:   Family History  Problem Relation Age of Onset  . Lung cancer Father   . Lung cancer Brother     type?  . Colon cancer Neg Hx   . Breast cancer Neg Hx   . CAD Neg Hx   . Diabetes Neg Hx     Medications: Prior to Admission medications   Medication Sig Start Date End Date Taking? Authorizing Provider  acetaminophen (TYLENOL) 500 MG tablet Take 1,000 mg by mouth every 6 (six) hours as needed for moderate pain.   Yes Historical Provider, MD  albuterol (PROVENTIL HFA;VENTOLIN HFA) 108 (90 BASE) MCG/ACT inhaler Inhale 1-2 puffs into the lungs every 6 (six) hours as needed for wheezing or shortness of breath. 11/24/14  Yes Nyoka CowdenMichael B Wert, MD  albuterol (PROVENTIL) (2.5 MG/3ML) 0.083% nebulizer solution Take 3 mLs (2.5 mg total) by nebulization every 4 (four) hours as needed for wheezing or shortness of breath. 08/23/15  Yes Nyoka CowdenMichael B Wert, MD  aspirin 325 MG tablet Take 325 mg by mouth once.   Yes Historical Provider, MD  budesonide-formoterol (SYMBICORT) 160-4.5 MCG/ACT inhaler Take 2 puffs first thing in am and then another 2 puffs about 12 hours later. 08/22/15  Yes Wanda PlumpJose E Paz, MD  hydrOXYzine (ATARAX/VISTARIL) 10 MG tablet Take 1 tablet (10 mg total) by mouth 3 (three) times daily as needed. Patient taking differently: Take 10 mg by mouth 3 (three) times daily as needed for itching (allergies).  11/08/15  Yes Wanda PlumpJose E Paz, MD  ibuprofen (ADVIL,MOTRIN) 200 MG tablet Take 400 mg by mouth every 6 (six) hours as needed for headache or moderate pain.   Yes Historical Provider, MD  pseudoephedrine (SUDAFED) 30 MG tablet Take 30 mg by mouth every 4 (four) hours as needed for congestion.   Yes Historical Provider, MD    Physical Exam: Patient Vitals for the past 24 hrs:  BP Temp Temp src Pulse Resp SpO2  12/17/15 1849 122/89 - - 77 21 97 %  12/17/15 1700 149/97 - - 76 16 98 %  12/17/15 1638 142/91 - - 72 18 97 %  12/17/15 1438 135/99  - - 69 16 96 %  12/17/15 1326 - 97.6 F (36.4 C) - - - -  12/17/15 1156 137/97 98.2 F (36.8 C) Oral 88 16 96 %    1. General:  in No Acute distress 2. Psychological: Alert and   Oriented 3. Head/ENT:     Dry Mucous Membranes                          Head Non traumatic, neck supple                          Normal   Dentition 4. SKIN:  decreased Skin turgor,  Skin clean Dry and intact no rash 5. Heart:  Regular rate and rhythm no  Murmur, Rub or gallop 6. Lungs:  no wheezes or crackles   7. Abdomen: Soft, non-tender, Non distended 8. Lower extremities: no clubbing, cyanosis, or edema 9. Neurologically  strength 5 out of 5 in all 4 extremities Disconjugate gaze noted  10. MSK: Normal range of motion   body mass index is unknown because there is no height or weight on file.  Labs on Admission:   Labs on Admission: I have personally reviewed following labs and imaging studies  CBC:  Recent Labs Lab 12/17/15 1234 12/17/15 1245  WBC 8.2  --   NEUTROABS 5.8  --   HGB 14.6 15.0  HCT 41.7 44.0  MCV 84.1  --   PLT 284  --    Basic Metabolic Panel:  Recent Labs Lab 12/17/15 1234 12/17/15 1245  NA 139 140  K 3.8 3.9  CL 105 104  CO2 25  --   GLUCOSE 97 94  BUN 25* 25*  CREATININE 0.66 0.70  CALCIUM 9.8  --    GFR: Estimated Creatinine Clearance: 74.9 mL/min (by C-G formula based on SCr of 0.7 mg/dL). Liver Function Tests:  Recent Labs Lab 12/17/15 1234  AST 19  ALT 17  ALKPHOS 86  BILITOT 0.7  PROT 7.9  ALBUMIN 4.3   No results for input(s): LIPASE, AMYLASE in the last 168 hours. No results for input(s): AMMONIA in the last 168 hours. Coagulation Profile:  Recent Labs Lab 12/17/15 1234  INR 0.92   Cardiac Enzymes: No results for input(s): CKTOTAL, CKMB, CKMBINDEX, TROPONINI in the last 168 hours. BNP (last 3 results) No results for input(s): PROBNP in the last 8760 hours. HbA1C: No results for input(s): HGBA1C in the last 72 hours. CBG:  Recent  Labs Lab 12/17/15 1153  GLUCAP 98   Lipid Profile: No results for input(s): CHOL, HDL, LDLCALC, TRIG, CHOLHDL, LDLDIRECT in the last 72 hours. Thyroid Function Tests: No results for input(s): TSH, T4TOTAL, FREET4, T3FREE, THYROIDAB in the last 72 hours. Anemia Panel: No results for input(s): VITAMINB12, FOLATE, FERRITIN, TIBC, IRON, RETICCTPCT in the last 72 hours.  Sepsis Labs: @LABRCNTIP (procalcitonin:4,lacticidven:4) )No results found for this or any previous visit (from the past 240 hour(s)).     UA   ordered  No results found for: HGBA1C  Estimated Creatinine Clearance: 74.9 mL/min (by C-G formula based on SCr of 0.7 mg/dL).  BNP (last 3 results) No results for input(s): PROBNP in the last 8760 hours.   ECG REPORT  Independently reviewed Rate: 83  Rhythm:  ST&T Change: No acute ischemic changes   QTC 453   There were no vitals filed for this visit.   Cultures:    Component Value Date/Time   SDES BLOOD LEFT ANTECUBITAL 11/08/2014 0443   SPECREQUEST  11/08/2014 0443    BOTTLES DRAWN AEROBIC AND ANAEROBIC BLUE 10CC RED 6CC   CULT NO GROWTH 5 DAYS 11/08/2014 0443   REPTSTATUS 11/13/2014 FINAL 11/08/2014 0443     Radiological Exams on Admission: Ct Head Wo Contrast  Result Date: 12/17/2015 CLINICAL DATA:  Pt. C/o rt.sided weakness, bilateral blurred vision since this morning, slurred speech EXAM: CT HEAD WITHOUT CONTRAST TECHNIQUE: Contiguous axial images were obtained from the base of the skull through the vertex without intravenous contrast. COMPARISON:  12/03/2015 FINDINGS: Brain: No evidence of acute infarction, hemorrhage, hydrocephalus, extra-axial collection or mass lesion/mass effect. Vascular: No hyperdense vessel or unexpected calcification. Skull: Normal. Negative for fracture or focal lesion. Sinuses/Orbits: No acute finding.  Other: None IMPRESSION: No evidence for acute intracranial abnormality. Electronically Signed   By: Norva Pavlov M.D.   On:  12/17/2015 13:28   Mr Brain Wo Contrast  Result Date: 12/17/2015 CLINICAL DATA:  Right-sided weakness EXAM: MRI HEAD WITHOUT CONTRAST TECHNIQUE: Multiplanar, multiecho pulse sequences of the brain and surrounding structures were obtained without intravenous contrast. COMPARISON:  Head CT 12/17/2015 FINDINGS: Brain: There is a punctate focus of diffusion restriction The midline structures are normal. Minimal hyperintense T2 weighted signal within the left thalamus at the site of restricted diffusion. No mass lesion or midline shift. No hydrocephalus or extra-axial fluid collection. No age advanced or lobar predominant atrophy. Vascular: Major intracranial arterial and venous sinus flow voids are preserved. No evidence of chronic microhemorrhage or amyloid angiopathy. Skull and upper cervical spine: The visualized skull base, calvarium, upper cervical spine and extracranial soft tissues are normal. Sinuses/Orbits: No fluid levels or advanced mucosal thickening. No mastoid effusion. Normal orbits. IMPRESSION: Punctate focus of acute ischemia within the left thalamus. No hemorrhage or mass effect. Electronically Signed   By: Deatra Robinson M.D.   On: 12/17/2015 20:07    Chart has been reviewed    Assessment/Plan  65 y.o. female with medical history significant of COPD admitted for thalamic CVA  Present on Admission: . CVA (cerebral vascular accident) [facility])  - will admit based on TIA/CVA protocol, MRA,   Order Carotid Doppler and Echo, obtain cardiac enzymes,  ECG,   Lipid panel, TSH. Order PT/OT evaluation. Will make sure patient is on antiplatelet agent now continue aspirin until seen by neurology.   Neurology consulted.      Marland Kitchen COPD GOLD III stable continue home medications . Tobacco abuse spoke about importance of quitting   Other plan as per orders.  DVT prophylaxis:  SCD   Code Status:  FULL CODE  as per patient   Family Communication:   Family  at  Bedside    Disposition Plan:      To  home once workup is complete and patient is stable                       Would benefit from PT/OT eval prior to DC   ordered                                              Consults called: Neurology Linzen  Admission status:   obs   Level of care    tele       I have spent a total of 56 min on this admission   Duard Spiewak 12/17/2015, 10:34 PM    [facility]  Pager [phone]   after 2 AM please page floor coverage PA If 7AM-7PM, please contact the day team taking care of the patient  [email or web address]  Password [email or web address]

## 2015-12-17 NOTE — ED Provider Notes (Signed)
[facility]-EMERGENCY DEPT Provider Note   CSN: [id number] Arrival date & time: 12/17/15  1149     History   Chief Complaint Chief Complaint  Patient presents with  . Aphasia  . Numbness    HPI [name] [name] is a 65 y.o. female.  The history is provided by the patient and a relative. No language interpreter was used.   [name] [name] is a 65 y.o. female who presents to the Emergency Department complaining of weakness.  Since 6 AM this morning she has had difficulty speaking as well as difficulty with moving her right leg at times, sometimes dragging it as she walks. Over the last 2 weeks she has had problems with shimmer-type sensation in her left eye. She was seen in the emergency department 2 weeks ago and had a negative CT head. 2 days ago she saw an ophthalmologist and was told that she had a cataract.  Sxs are moderate, waxing and waning, improving.    Past Medical History:  Diagnosis Date  . COPD (chronic obstructive pulmonary disease) (HCC)   . Diverticulitis     Patient Active Problem List   Diagnosis Date Noted  . Left shoulder pain 11/22/2015  . PCP NOTE>>>>>>>>>>>>>>>>>>>>>>>>>>>> 05/03/2015  . Anxiety state 03/12/2015  . Smoker 03/12/2015  . COPD GOLD III 11/11/2014  . Obesity (BMI 30-39.9) 11/11/2014  . Pneumothorax, spontaneous, tension 10/26/2014    Past Surgical History:  Procedure Laterality Date  . CESAREAN SECTION    . STAPLING OF BLEBS Left 10/31/2014   Procedure: STAPLING OF BLEBS;  Surgeon: Alleen BorneBryan K Bartle, MD;  Location: [facility] OR;  Service: Thoracic;  Laterality: Left;  Marland Kitchen. VIDEO ASSISTED THORACOSCOPY Left 10/31/2014   Procedure: LEFT VIDEO ASSISTED THORACOSCOPY;  Surgeon: Alleen BorneBryan K Bartle, MD;  Location: [facility] OR;  Service: Thoracic;  Laterality: Left;    OB History    No data available       Home Medications    Prior to Admission medications   Medication Sig Start Date End Date Taking? Authorizing Provider  acetaminophen (TYLENOL) 500 MG tablet Take  1,000 mg by mouth every 6 (six) hours as needed for moderate pain.    Historical Provider, MD  albuterol (PROVENTIL HFA;VENTOLIN HFA) 108 (90 BASE) MCG/ACT inhaler Inhale 1-2 puffs into the lungs every 6 (six) hours as needed for wheezing or shortness of breath. 11/24/14   Nyoka CowdenMichael B Wert, MD  albuterol (PROVENTIL) (2.5 MG/3ML) 0.083% nebulizer solution Take 3 mLs (2.5 mg total) by nebulization every 4 (four) hours as needed for wheezing or shortness of breath. 08/23/15   Nyoka CowdenMichael B Wert, MD  budesonide-formoterol [facility](SYMBICORT) 160-4.5 MCG/ACT inhaler Take 2 puffs first thing in am and then another 2 puffs about 12 hours later. 08/22/15   Wanda PlumpJose E Paz, MD  buPROPion (WELLBUTRIN) 75 MG tablet Take 75 mg by mouth 2 (two) times daily.    Historical Provider, MD  hydrOXYzine (ATARAX/VISTARIL) 10 MG tablet Take 1 tablet (10 mg total) by mouth 3 (three) times daily as needed. 11/08/15   Wanda PlumpJose E Paz, MD  pseudoephedrine (SUDAFED) 30 MG tablet Take 30 mg by mouth every 4 (four) hours as needed for congestion.    Historical Provider, MD    Family History Family History  Problem Relation Age of Onset  . Lung cancer Father   . Lung cancer Brother     type?  . Colon cancer Neg Hx   . Breast cancer Neg Hx   . CAD Neg Hx   .  Diabetes Neg Hx     Social History Social History  Substance Use Topics  . Smoking status: Current Every Day Smoker    Last attempt to quit: 10/26/2014  . Smokeless tobacco: Never Used     Comment: started age in her late teens; +/- 1 ppd  . Alcohol use No     Allergies   Patient has no known allergies.   Review of Systems Review of Systems  All other systems reviewed and are negative.    Physical Exam Updated Vital Signs BP 137/97   Pulse 88   Temp 98.2 F (36.8 C) (Oral)   Resp 16   SpO2 96%   Physical Exam  Constitutional: She is oriented to person, place, and time. She appears well-developed and well-nourished.  HENT:  Head: Normocephalic and atraumatic.  Eyes:  EOM are normal. Pupils are equal, round, and reactive to light.  Cardiovascular: Normal rate and regular rhythm.   No murmur heard. Pulmonary/Chest: Effort normal and breath sounds normal. No respiratory distress.  Abdominal: Soft. There is no tenderness. There is no rebound and no guarding.  Musculoskeletal: She exhibits no edema or tenderness.  Neurological: She is alert and oriented to person, place, and time. No cranial nerve deficit. Coordination normal.  5 out of 5 strength in all 4 extremities with sensation to light touch intact in all 4 extremities. No pronator drift. Visual fields intact.  Skin: Skin is warm and dry.  Psychiatric: She has a normal mood and affect. Her behavior is normal.  Nursing note and vitals reviewed.    ED Treatments / Results  Labs (all labs ordered are listed, but only abnormal results are displayed) Labs Reviewed  PROTIME-INR  APTT  CBC  DIFFERENTIAL  COMPREHENSIVE METABOLIC PANEL  CBG MONITORING, ED  CBG MONITORING, ED  I-STAT TROPOININ, ED  I-STAT CHEM 8, ED    EKG  EKG Interpretation None       Radiology No results found.  Procedures Procedures (including critical care time)  Medications Ordered in ED Medications - No data to display   Initial Impression / Assessment and Plan / ED Course  I have reviewed the triage vital signs and the nursing notes.  Pertinent labs & imaging results that were available during my care of the patient were reviewed by me and considered in my medical decision making (see chart for details).  Clinical Course     Patient here for evaluation of speech difficulties and lower extremity weakness, symptoms are resolving in the emergency department. She does have intermittent vision changes been ongoing for the last 2 weeks and she has been evaluated by ophthalmology and diagnosed with cataracts. CT scan with no acute changes. MRI pending to further evaluate for CVA versus TIA.  Patient care transferred  pending MRI.    Final Clinical Impressions(s) / ED Diagnoses   Final diagnoses:  Cerebrovascular accident (CVA), unspecified mechanism (HCC)    New Prescriptions New Prescriptions   No medications on file     Tilden FossaElizabeth Amybeth Sieg, MD 12/18/15 1318

## 2015-12-17 NOTE — ED Triage Notes (Signed)
Pt reports ongoing R side weakness for the past 2 weeks. Woke up with slurred speech, R arm numbness, dizziness, and "shimmers in her eyes."

## 2015-12-18 ENCOUNTER — Observation Stay (HOSPITAL_COMMUNITY): Payer: Self-pay

## 2015-12-18 ENCOUNTER — Observation Stay (HOSPITAL_BASED_OUTPATIENT_CLINIC_OR_DEPARTMENT_OTHER): Payer: Self-pay

## 2015-12-18 DIAGNOSIS — I6300 Cerebral infarction due to thrombosis of unspecified precerebral artery: Secondary | ICD-10-CM

## 2015-12-18 DIAGNOSIS — I639 Cerebral infarction, unspecified: Secondary | ICD-10-CM

## 2015-12-18 LAB — VAS US CAROTID
LCCAPDIAS: 26 cm/s
LCCAPSYS: 99 cm/s
LEFT ECA DIAS: -13 cm/s
LEFT VERTEBRAL DIAS: 7 cm/s
LICADDIAS: -28 cm/s
LICADSYS: -63 cm/s
LICAPSYS: 58 cm/s
Left CCA dist dias: -20 cm/s
Left CCA dist sys: -51 cm/s
Left ICA prox dias: 18 cm/s
RCCADSYS: -83 cm/s
RIGHT ECA DIAS: -17 cm/s
RIGHT VERTEBRAL DIAS: 17 cm/s
Right CCA prox dias: 27 cm/s
Right CCA prox sys: 81 cm/s

## 2015-12-18 LAB — LIPID PANEL
Cholesterol: 196 mg/dL (ref 0–200)
HDL: 56 mg/dL (ref >40)
LDL CALC: 117 mg/dL — AB (ref 0–99)
Total CHOL/HDL Ratio: 3.5 RATIO
Triglycerides: 114 mg/dL (ref <150)
VLDL: 23 mg/dL (ref 0–40)

## 2015-12-18 LAB — TROPONIN I

## 2015-12-18 MED ORDER — ACETAMINOPHEN 325 MG PO TABS: 650.0000 mg | ORAL_TABLET | ORAL | Status: DC | PRN

## 2015-12-18 MED ORDER — MOMETASONE FURO-FORMOTEROL FUM 200-5 MCG/ACT IN AERO
2.0000 | INHALATION_SPRAY | Freq: Two times a day (BID) | RESPIRATORY_TRACT | Status: DC
  Administered 2015-12-18 – 2015-12-19 (×3): 2 via RESPIRATORY_TRACT
  Filled 2015-12-18: qty 8.8

## 2015-12-18 MED ORDER — ACETAMINOPHEN 650 MG RE SUPP: 650.0000 mg | RECTAL | Status: DC | PRN

## 2015-12-18 MED ORDER — ASPIRIN 325 MG PO TABS
325.0000 mg | ORAL_TABLET | Freq: Once | ORAL | Status: AC
  Administered 2015-12-18: 325 mg via ORAL
  Filled 2015-12-18: qty 1

## 2015-12-18 MED ORDER — ENOXAPARIN SODIUM 40 MG/0.4ML ~~LOC~~ SOLN
40.0000 mg | SUBCUTANEOUS | Status: DC
  Administered 2015-12-18: 40 mg via SUBCUTANEOUS
  Filled 2015-12-18: qty 0.4

## 2015-12-18 MED ORDER — SODIUM CHLORIDE 0.9 % IV SOLN
INTRAVENOUS | Status: DC
  Administered 2015-12-18: 1000 mL via INTRAVENOUS

## 2015-12-18 MED ORDER — NICOTINE 7 MG/24HR TD PT24
7.0000 mg | MEDICATED_PATCH | Freq: Every day | TRANSDERMAL | Status: DC
  Filled 2015-12-18 (×2): qty 1

## 2015-12-18 MED ORDER — STROKE: EARLY STAGES OF RECOVERY BOOK
Freq: Once | Status: AC
  Administered 2015-12-18: 02:00:00
  Filled 2015-12-18: qty 1

## 2015-12-18 MED ORDER — ATORVASTATIN CALCIUM 10 MG PO TABS
20.0000 mg | ORAL_TABLET | Freq: Every day | ORAL | Status: DC
  Administered 2015-12-18 – 2015-12-19 (×2): 20 mg via ORAL
  Filled 2015-12-18 (×2): qty 2

## 2015-12-18 NOTE — Consult Note (Signed)
NEURO HOSPITALIST CONSULT NOTE   Requestig physician: Dr. Roel Cluck  Reason for Consult: Subacute left thalamic ischemic infarction  History obtained from:  Patient and Chart     HPI:                                                                                                                                          [name] [name] is an 65 y.o. female who initially presented to the [facility] ED with a chief complaint of right lower extremity weakness and difficulty speaking. She first noticed the symptoms at 6 AM on 11/13. She also has been having an odd sensation in her left eye consisting of shimmering lights of an and for 2 weeks. Between 1 and 4 days ago (she and her son have difficulty recalling exact time of onset) she began experiencing double vision, which goes away when she closes either eye. Three days ago she saw an ophthalmologist who diagnosed her with bilateral cataracts. Seen in the ED 2 weeks ago for the shimmering lights phenomenon; CT was performed at that time, which was negative. Repeat CT on 11/13 also was negative. After the CT yesterday, she had an MRI scan of her brain, which revealed a punctate-to-small subacute ischemic infarction within her left thalamus. She has had mild posterior headache for the last few days.   She has not felt systemically ill, denying fever or chills, but did have some nausea at about the time of symptom onset yesterday. She endorses having been bit by 4 separate ticks this Summer; she recalls picking the ticks off, which were not engorged with blood. She states the bites became swollen and red but denies a target shaped lesion; the largest was about the width of a golf ball; "they took a long time to go away" per patient; she denies having been systemically ill after the tick bites; she and her [place] suspect [place]   Past Medical History:  Diagnosis Date  . COPD (chronic  obstructive pulmonary disease) ([place])   . Diverticulitis     Past Surgical History:  Procedure Laterality Date  . CESAREAN SECTION    . STAPLING OF BLEBS Left 10/31/2014   Procedure: STAPLING OF BLEBS;  Surgeon: Gaye Pollack, MD;  Location: [facility];  Service: Thoracic;  Laterality: Left;  Marland Kitchen VIDEO ASSISTED THORACOSCOPY Left 10/31/2014   Procedure: LEFT VIDEO ASSISTED THORACOSCOPY;  Surgeon: Gaye Pollack, MD;  Location: [facility] OR;  Service: Thoracic;  Laterality: Left;    Family History  Problem Relation Age of Onset  . Lung cancer Father   . Lung cancer Brother     type?  . Colon cancer Neg Hx   . Breast cancer Neg Hx   .  CAD Neg Hx   . Diabetes Neg Hx     Social History:  reports that she has been smoking.  She has never used smokeless tobacco. She reports that she does not drink alcohol or use drugs.  No Known Allergies  Home MEDICATIONS:                                                                                                                     Current Meds  Medication Sig  . acetaminophen (TYLENOL) 500 MG tablet Take 1,000 mg by mouth every 6 (six) hours as needed for moderate pain.  Marland Kitchen albuterol (PROVENTIL HFA;VENTOLIN HFA) 108 (90 BASE) MCG/ACT inhaler Inhale 1-2 puffs into the lungs every 6 (six) hours as needed for wheezing or shortness of breath.  Marland Kitchen albuterol (PROVENTIL) (2.5 MG/3ML) 0.083% nebulizer solution Take 3 mLs (2.5 mg total) by nebulization every 4 (four) hours as needed for wheezing or shortness of breath.  Marland Kitchen aspirin 325 MG tablet Take 325 mg by mouth once.  . budesonide-formoterol (SYMBICORT) 160-4.5 MCG/ACT inhaler Take 2 puffs first thing in am and then another 2 puffs about 12 hours later.  . hydrOXYzine (ATARAX/VISTARIL) 10 MG tablet Take 1 tablet (10 mg total) by mouth 3 (three) times daily as needed. (Patient taking differently: Take 10 mg by mouth 3 (three) times daily as needed for itching (allergies). )  . ibuprofen (ADVIL,MOTRIN) 200 MG tablet Take  400 mg by mouth every 6 (six) hours as needed for headache or moderate pain.  . pseudoephedrine (SUDAFED) 30 MG tablet Take 30 mg by mouth every 4 (four) hours as needed for congestion.          Current Facility-Administered Medications (Respiratory):  .  albuterol (PROVENTIL) (2.5 MG/3ML) 0.083% nebulizer solution 2.5 mg .  mometasone-formoterol (DULERA) 200-5 MCG/ACT inhaler 2 puff   Current Facility-Administered Medications (Analgesics):  .  acetaminophen (TYLENOL) tablet 650 mg **OR** acetaminophen (TYLENOL) suppository 650 mg     Current Facility-Administered Medications (Other):  .  0.9 %  sodium chloride infusion  No current outpatient prescriptions on file.   ROS:                                                                                                                                       History obtained from patient. Denies chest pain, abdominal pain or limb pain. Other ROS as per HPI.  Blood pressure 121/78, pulse 96, temperature 98.2 F (36.8 C), temperature source Oral, resp. rate 16, height _0  (1.651 m), weight 89.7 kg (197 lb 11.2 oz), SpO2 99 %.   General Examination:                                                                                                      HEENT-  Normocephalic/atraumatic.    Lungs- Respirations unlabored. Mild wheezing is audible during interview.  Extremities- Warm and well perfused Skin: No diaphoresis, anicteric, non-cyanotic   Neurological Examination Mental Status: Alert, fully oriented, thought content appropriate.  Speech fluent without evidence of aphasia.  Able to follow all commands without difficulty. Cranial Nerves: II:  Visual fields intact to confrontation, pupils equal, round and reactive to light  III,IV, VI: Left medial rectus palsy with associated double vision, worse when gazing to right. Intermittent exotropia is noted. Left eye unable to fully adduct on rightward gaze.  V,VII: smile symmetric,  mild hyperesthesia to temperature right side of face VIII: hearing intact to conversation IX,X: no hypophonia or hoarseness XI: symmetric XII: midline tongue extension Motor: Right : Upper extremity   5/5    Left:     Upper extremity   5/5  Lower extremity   5/5     Lower extremity   5/5 Normal tone throughout; no atrophy noted Sensory: Temperature and light touch intact in upper and lower extremities, without extinction Deep Tendon Reflexes: 3+ and symmetric throughout Cerebellar: No ataxia on FNF bilaterally Gait: Deferred  Lab Results: Basic Metabolic Panel:  Recent Labs Lab 12/17/15 1234 12/17/15 1245  NA 139 140  K 3.8 3.9  CL 105 104  CO2 25  --   GLUCOSE 97 94  BUN 25* 25*  CREATININE 0.66 0.70  CALCIUM 9.8  --     Liver Function Tests:  Recent Labs Lab 12/17/15 1234  AST 19  ALT 17  ALKPHOS 86  BILITOT 0.7  PROT 7.9  ALBUMIN 4.3   No results for input(s): LIPASE, AMYLASE in the last 168 hours. No results for input(s): AMMONIA in the last 168 hours.  CBC:  Recent Labs Lab 12/17/15 1234 12/17/15 1245  WBC 8.2  --   NEUTROABS 5.8  --   HGB 14.6 15.0  HCT 41.7 44.0  MCV 84.1  --   PLT 284  --     Cardiac Enzymes:  Recent Labs Lab 12/18/15 0230  TROPONINI <0.03    Lipid Panel:  Recent Labs Lab 12/18/15 0230  CHOL 196  TRIG 114  HDL 56  CHOLHDL 3.5  VLDL 23  LDLCALC 117*    CBG:  Recent Labs Lab 12/17/15 1153  [place]    Microbiology: Results for orders placed or performed during the hospital encounter of 11/08/14  Culture, blood (routine x 2)     Status: None   Collection Time: 11/08/14  4:42 AM  Result Value Ref Range Status   Specimen Description BLOOD RIGHT HAND  Final   Special Requests BOTTLES DRAWN AEROBIC ONLY [place]  Final   Culture NO  GROWTH 5 DAYS  Final   Report Status 11/13/2014 FINAL  Final  Culture, blood (routine x 2)     Status: None   Collection Time: 11/08/14  4:43 AM  Result Value Ref Range Status    Specimen Description BLOOD LEFT ANTECUBITAL  Final   Special Requests   Final    BOTTLES DRAWN AEROBIC AND ANAEROBIC BLUE 10CC RED [place]   Culture NO GROWTH 5 DAYS  Final   Report Status 11/13/2014 FINAL  Final    Coagulation Studies:  Recent Labs  12/17/15 1234  LABPROT 12.3  INR 0.92    Imaging: Ct Head Wo Contrast  Result Date: 12/17/2015 CLINICAL DATA:  Pt. C/o rt.sided weakness, bilateral blurred vision since this morning, slurred speech EXAM: CT HEAD WITHOUT CONTRAST TECHNIQUE: Contiguous axial images were obtained from the base of the skull through the vertex without intravenous contrast. COMPARISON:  12/03/2015 FINDINGS: Brain: No evidence of acute infarction, hemorrhage, hydrocephalus, extra-axial collection or mass lesion/mass effect. Vascular: No hyperdense vessel or unexpected calcification. Skull: Normal. Negative for fracture or focal lesion. Sinuses/Orbits: No acute finding. Other: None IMPRESSION: No evidence for acute intracranial abnormality. Electronically Signed   By: Nolon Nations M.D.   On: 12/17/2015 13:28   Mr Brain Wo Contrast  Result Date: 12/17/2015 CLINICAL DATA:  Right-sided weakness EXAM: MRI HEAD WITHOUT CONTRAST TECHNIQUE: Multiplanar, multiecho pulse sequences of the brain and surrounding structures were obtained without intravenous contrast. COMPARISON:  Head CT 12/17/2015 FINDINGS: Brain: There is a punctate focus of diffusion restriction The midline structures are normal. Minimal hyperintense T2 weighted signal within the left thalamus at the site of restricted diffusion. No mass lesion or midline shift. No hydrocephalus or extra-axial fluid collection. No age advanced or lobar predominant atrophy. Vascular: Major intracranial arterial and venous sinus flow voids are preserved. No evidence of chronic microhemorrhage or amyloid angiopathy. Skull and upper cervical spine: The visualized skull base, calvarium, upper cervical spine and extracranial soft  tissues are normal. Sinuses/Orbits: No fluid levels or advanced mucosal thickening. No mastoid effusion. Normal orbits. IMPRESSION: Punctate focus of acute ischemia within the left thalamus. No hemorrhage or mass effect. Electronically Signed   By: Ulyses Jarred M.D.   On: 12/17/2015 20:07    Assessment: 1. 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 Classifiable as having failed ASA therapy. Risk factors for stroke are age and HTN.  2. Left medial rectus palsy. Given that this has occurred at about the same time as her lacunar stroke, a small vessel vasculitis is on the DDx. Other potential etiologies include infarction of a branch of the left oculomotor nerve, Lyme disease (recent history of tick bites) and thyroid ophthalmopathy.  3. Intermittent scintillating scotoma. Has a history of headaches that the patient's son states may be migraines. Has been experiencing headaches recently. DDx includes migraine with aura.   Recommendations: 1. MRA head and neck. TTE.  2. Switch ASA to Plavix.  3. Start atorvastatin 20 mg po qd.  4. BP management. Out of permissive HTN time window.  5. TSH. 6. HgbA1c 7. ESR, RPR, C-reactive protein, ANA, rheumatoid factor. 8. Serum Lyme titer.  9. If above work up is negative, consider lumbar puncture.    Electronically signed: Dr. Kerney Elbe 12/18/2015, 5:34 AM

## 2015-12-18 NOTE — Progress Notes (Signed)
[facility] Progress Note  Patient: [name] [name]:[id number]   PCP: Willow Ora, MD DOB: 1950-10-23   DOA: 12/17/2015   DOS: 12/18/2015   Date of Service: the patient was seen and examined on 12/18/2015  Brief hospital course: Pt. with PMH of COPD; admitted on 12/17/2015, with complaint of vision changes, was found to have thalamic infarct. Currently further plan is completed stroke workup.  Assessment and Plan: 1. Left thalamic embolic infarct. Complains about double vision. Also complains about mild pain while moving her eyes. MRA mild narrowing, no significant stenosis. Carotid Doppler no significant stenosis. LDL 117, on Lipitor 20 mg daily. On aspirin 325 mg daily. Hemoglobin A1c pending. Avoid smoking. PTOT consult currently pending. Passed swallowing evaluation. Echocardiogram pending.  2. History of COPD. On dulera here in the hospital. Symbicort at home. Continue monitoring. No evidence of exacerbation at present.  3. Active smoker. On nicotine patch.  Pain management: When necessary Tylenol Activity: Consulted physical therapy Bowel regimen: last BM prior to admission Diet: Cardiac diet DVT Prophylaxis: subcutaneous Heparin  Advance goals of care discussion: Full code  Family Communication: no family was present at bedside, at the time of interview.   Disposition:  Discharge to home. Expected discharge date: 12/19/2015  Consultants: neurology Procedures: Carotid Doppler  Antibiotics: Anti-infectives    None     Subjective: Denies any acute complaint. Continues to have diplopia but tells me that she is happy that her vision is clear when she is looking straight ahead.  Objective: Physical Exam: Vitals:   12/18/15 0600 12/18/15 0800 12/18/15 0953 12/18/15 0955  BP: (!) 142/78 135/74 131/80   Pulse: 82 78 82 97  Resp: 17 16 18 18   Temp: 97.7 F (36.5 C) 97.8 F (36.6 C) 98 F (36.7 C)   TempSrc: Oral Oral Oral   SpO2: 95% 100% 99%  97%  Weight:      Height:        Intake/Output Summary (Last 24 hours) at 12/18/15 1451 Last data filed at 12/18/15 0830  Gross per 24 hour  Intake            467.5 ml  Output                0 ml  Net            467.5 ml   Filed Weights   12/18/15 0157  Weight: 89.7 kg (197 lb 11.2 oz)    General: Alert, Awake and Oriented to Time, Place and Person. Appear in mild distress, affect appropriate Eyes: PERRL, Conjunctiva normal ENT: Oral Mucosa clear moist. Neck: no JVD, no Abnormal Mass Or lumps Cardiovascular: S1 and S2 Present, no Murmur, Respiratory: Bilateral Air entry equal and Decreased, no use of accessory muscle, Clear to Auscultation, no Crackles, no wheezes Abdomen: Bowel Sound present, Soft and no tenderness Skin: no redness, no Rash, no induration Extremities: no Pedal edema, no calf tenderness Neurologic: Grossly no focal neuro deficit. Bilaterally Equal motor strength  Data Reviewed: CBC:  Recent Labs Lab 12/17/15 1234 12/17/15 1245  WBC 8.2  --   NEUTROABS 5.8  --   HGB 14.6 15.0  HCT 41.7 44.0  MCV 84.1  --   PLT 284  --    Basic Metabolic Panel:  Recent Labs Lab 12/17/15 1234 12/17/15 1245  NA 139 140  K 3.8 3.9  CL 105 104  CO2 25  --   GLUCOSE 97 94  BUN 25* 25*  CREATININE 0.66 0.70  CALCIUM 9.8  --     Liver Function Tests:  Recent Labs Lab 12/17/15 1234  AST 19  ALT 17  ALKPHOS 86  BILITOT 0.7  PROT 7.9  ALBUMIN 4.3   No results for input(s): LIPASE, AMYLASE in the last 168 hours. No results for input(s): AMMONIA in the last 168 hours. Coagulation Profile:  Recent Labs Lab 12/17/15 1234  INR 0.92   Cardiac Enzymes:  Recent Labs Lab 12/18/15 0230  TROPONINI <0.03   BNP (last 3 results) No results for input(s): PROBNP in the last 8760 hours.  CBG:  Recent Labs Lab 12/17/15 1153  GLUCAP 98    Studies: Dg Chest 2 View  Result Date: 12/18/2015 CLINICAL DATA:  COPD, smoker EXAM: CHEST  2 VIEW COMPARISON:   11/08/2015 FINDINGS: Mild hyperinflation. Scarring in the lingula. Right lung is clear. Heart is normal size. No effusions or acute bony abnormality. IMPRESSION: COPD/chronic changes.  No active disease. Electronically Signed   By: Charlett NoseKevin  Dover M.D.   On: 12/18/2015 09:19   Mr Brain Wo Contrast  Result Date: 12/17/2015 CLINICAL DATA:  Right-sided weakness EXAM: MRI HEAD WITHOUT CONTRAST TECHNIQUE: Multiplanar, multiecho pulse sequences of the brain and surrounding structures were obtained without intravenous contrast. COMPARISON:  Head CT 12/17/2015 FINDINGS: Brain: There is a punctate focus of diffusion restriction The midline structures are normal. Minimal hyperintense T2 weighted signal within the left thalamus at the site of restricted diffusion. No mass lesion or midline shift. No hydrocephalus or extra-axial fluid collection. No age advanced or lobar predominant atrophy. Vascular: Major intracranial arterial and venous sinus flow voids are preserved. No evidence of chronic microhemorrhage or amyloid angiopathy. Skull and upper cervical spine: The visualized skull base, calvarium, upper cervical spine and extracranial soft tissues are normal. Sinuses/Orbits: No fluid levels or advanced mucosal thickening. No mastoid effusion. Normal orbits. IMPRESSION: Punctate focus of acute ischemia within the left thalamus. No hemorrhage or mass effect. Electronically Signed   By: Deatra RobinsonKevin  Herman M.D.   On: 12/17/2015 20:07   Mr Maxine GlennMra Head/brain OZWo Cm  Result Date: 12/18/2015 CLINICAL DATA:  65 year old female with double vision and tongue numbness. Acute left thalamic infarct. Subsequent encounter. EXAM: MRA HEAD WITHOUT CONTRAST TECHNIQUE: Angiographic images of the Circle of Willis were obtained using MRA technique without intravenous contrast. COMPARISON:  12/17/2015 head CT and brain MR. FINDINGS: Anterior circulation without medium or large size vessel significant stenosis or occlusion. Fetal contribution to the  formation of the posterior cerebral arteries bilaterally. No aneurysm noted. Right vertebral artery is dominant and ectatic without significant narrowing. Left vertebral artery is small in caliber throughout its course. Ectatic basilar artery without high-grade stenosis. Nonvisualized anterior inferior cerebellar artery bilaterally. Mild irregularity and narrowing of portions of the posterior inferior cerebellar artery and superior cerebellar artery bilaterally. Right posterior cerebral artery branch vessel narrowing and irregularity. IMPRESSION: Anterior circulation without medium or large size vessel significant stenosis or occlusion. 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 Ectatic right vertebral artery and basilar artery without significant narrowing. Nonvisualized anterior inferior cerebellar artery bilaterally. Right posterior cerebral artery branch vessel narrowing and irregularity. Mild irregularity and narrowing of portions of the posterior inferior cerebellar artery and superior cerebellar artery bilaterally. Electronically Signed   By: Lacy DuverneySteven  Olson M.D.   On: 12/18/2015 09:16     Scheduled Meds: . atorvastatin  20 mg Oral q1800  . mometasone-formoterol  2 puff Inhalation BID   Continuous Infusions: PRN Meds: acetaminophen **OR** acetaminophen, albuterol  Time  spent: 30 minutes  Author: Lynden OxfordPranav Danijah Noh, MD [facility] Pager: [phone] 12/18/2015 2:51 PM  If 7PM-7AM, please contact night-coverage at [email or web address], password [email or web address]

## 2015-12-18 NOTE — Progress Notes (Signed)
Pharmacy Consult -> Lovenox  For DVT prophylaxis CBC stable Scr stable  Plan: Lovenox 40 mg sq Q 24 hours Pharmacy to sign off  Thank you Okey RegalLisa Allante Beane, PharmD [phone]

## 2015-12-18 NOTE — Progress Notes (Addendum)
*  PRELIMINARY RESULTS* Vascular Ultrasound Carotid Duplex (Doppler) has been completed.  Preliminary findings: Bilateral 1-39% ICA stenosis. The right vertebral artery is patent and antegrade, the left vertebral artery demonstrates antegrade flow but with an atypical waveform.  [name] FischerCharlotte C Lindwood [name] 12/18/2015, 10:36 AM

## 2015-12-18 NOTE — Evaluation (Signed)
Physical Therapy Evaluation Patient Details Name: [name] [name] MRN: [id number] DOB: 06/18/1950 Today's Date: 12/18/2015   History of Present Illness  [name] [name] is a 65 y.o. female with medical history significant of COPD tobacco abuse, presenting with RUE numbness, weakness, and double vision; shehad an MRI scan of her brain, which revealed a punctate-to-small subacute ischemic infarction within her left thalamus.  Clinical Impression   Pt admitted with above diagnosis. Pt currently with functional limitations due to the deficits listed below (see PT Problem List). Overall moving well, and she tells me her symptoms of stroke have largely resolved, though still diplopia when she feels her eyes are tired; Walked on [employer] and noted DOE; worth another walk tomorrow with O2 sats monitored; Became tearful during session, reported stress when thinking about hospital bills; provided pt with number for financial counselor's office;  Pt will benefit from skilled PT to increase their independence and safety with mobility to allow discharge to the venue listed below.       Follow Up Recommendations No PT follow up    Equipment Recommendations  None recommended by PT    Recommendations for Other Services Other (comment) Research scientist (physical sciences)(Financial Counselor)     Precautions / Restrictions Precautions Precaution Comments: Noted DOE      Mobility  Bed Mobility Overal bed mobility: Independent                Transfers Overall transfer level: Needs assistance Equipment used: None Transfers: Sit to/from Stand Sit to Stand: Supervision         General transfer comment: Cues to self-monitor for activity tolerance  Ambulation/Gait Ambulation/Gait assistance: Supervision Ambulation Distance (Feet): 120 Feet Assistive device: None Gait Pattern/deviations: Step-through pattern     General Gait Details: Cues to self-monitor for activity tolerance; noted DOE 2/4 progressing to 3/4; pt states  this happens  from time to time due to her COPD  Stairs            Wheelchair Mobility    Modified Rankin (Stroke Patients Only)       Balance Overall balance assessment: No apparent balance deficits (not formally assessed)                                           Pertinent Vitals/Pain Pain Assessment: No/denies pain    Home Living Family/patient expects to be discharged to:: Private residence Living Arrangements: Children Available Help at Discharge: Family;Available PRN/intermittently Type of Home: House Home Access: Stairs to enter Entrance Stairs-Rails: None Entrance Stairs-Number of Steps: 1 Home Layout: One level        Prior Function Level of Independence: Independent               Hand Dominance        Extremity/Trunk Assessment   Upper Extremity Assessment: Defer to OT evaluation           Lower Extremity Assessment: Overall WFL for tasks assessed         Communication   Communication: No difficulties  Cognition Arousal/Alertness: Awake/alert Behavior During Therapy: WFL for tasks assessed/performed (slightly impulsive, and emotionally labile) Overall Cognitive Status: Within Functional Limits for tasks assessed                      General Comments General comments (skin integrity, edema, etc.): We discussed symptoms of a CVA, and  risk factors; pt independently pointed out her cigarette use is a facotr, and tells me she will quit; she tells me she will follow up with her PCP to keep track of her cholesterol    Exercises     Assessment/Plan    PT Assessment Patient needs continued PT services  PT Problem List Cardiopulmonary status limiting activity          PT Treatment Interventions Gait training;Stair training;Functional mobility training    PT Goals (Current goals can be found in the Care Plan section)  Acute Rehab PT Goals Patient Stated Goal: did not state, but voiced concern over  hospital bill PT Goal Formulation: With patient Time For Goal Achievement: 12/25/15    Frequency Min 4X/week   Barriers to discharge        Co-evaluation               End of Session Equipment Utilized During Treatment: Gait belt Activity Tolerance: Patient tolerated treatment well Patient left: in chair;Other (comment) (moving independently in room) Nurse Communication: Mobility status;Other (comment) (perhaps pt could use a breathing treatment)    Functional Assessment Tool Used: Clinical Judgement Functional Limitation: Mobility: Walking and moving around Mobility: Walking and Moving Around Current Status ([id number](G8978): At least 1 percent but less than 20 percent impaired, limited or restricted Mobility: Walking and Moving Around Goal Status [phone](G8979): 0 percent impaired, limited or restricted    Time: 1434-1500 (end time is approximate) PT Time Calculation (min) (ACUTE ONLY): 26 min   Charges:   PT Evaluation $PT Eval Low Complexity: 1 Procedure PT Treatments $Gait Training: 8-22 mins   PT G Codes:   PT G-Codes **NOT FOR INPATIENT CLASS** Functional Assessment Tool Used: Clinical Judgement Functional Limitation: Mobility: Walking and moving around Mobility: Walking and Moving Around Current Status ([id number](G8978): At least 1 percent but less than 20 percent impaired, limited or restricted Mobility: Walking and Moving Around Goal Status [phone]([id number]): 0 percent impaired, limited or restricted    Levi AlandHolly H Benecio Kluger 12/18/2015, 4:08 PM  Van ClinesHolly Santana Gosdin, PT  Acute Rehabilitation Services Pager [phone] Office [phone]

## 2015-12-18 NOTE — Progress Notes (Signed)
Pt arrived around 0200 alert and oriented still having double vision but otherwise neurologically intact MRI was positive for punctate focal w/in left thalamus, no hemorrhage or mass effect. Son is in room will continue to monitor.

## 2015-12-18 NOTE — Progress Notes (Signed)
STROKE TEAM PROGRESS NOTE   HISTORY OF PRESENT ILLNESS (per record) [name] [name] is an 65 y.o. female who [facility] with a chief complaint of right lower extremity weakness and difficulty speaking. She first noticed the symptoms at 6 AM on 12/17/15 ([facility]). She also has been having an odd sensation in her left eye consisting of shimmering lights of an and for 2 weeks. Between 1 and 4 days ago (she and her son have difficulty recalling exact time of onset) she began experiencing double vision, which goes away when she closes either eye. Three days ago she saw an ophthalmologist who diagnosed her with bilateral cataracts. Seen in the ED 2 weeks ago for the shimmering lights phenomenon; CT was performed at that time, which was negative. Repeat CT on 11/13 also was negative. After the CT yesterday, she had an MRI scan of her brain, which revealed a punctate-to-small subacute ischemic infarction within her left thalamus. She has had mild posterior headache for the last few days.   She has not felt systemically ill, denying fever or chills, but did have some nausea at about the time of symptom onset yesterday. She endorses having been bit by 4 separate ticks this Summer; she recalls picking the ticks off, which were not engorged with blood. She states the bites became swollen and red but denies a target shaped lesion; the largest was about the width of a golf ball; "they took a long time to go away" per patient; she denies having been systemically ill after the tick bites; she and her [place] suspect [place]   Patient was not considered for IV t-PA given delay in arrival. She was admitted for further evaluation and treatment.   SUBJECTIVE (INTERVAL HISTORY) No family is at the bedside.  Overall she feels her condition is stable. She complains of double vision.    OBJECTIVE Temp:  [97.6 F (36.4 C)-98.2 F (36.8 C)] 98 F (36.7  C) (11/14 0953) Pulse Rate:  [69-97] 97 (11/14 0955) Cardiac Rhythm: Normal sinus rhythm (11/14 0700) Resp:  [14-21] 18 (11/14 0955) BP: (100-149)/(71-102) 131/80 (11/14 0953) SpO2:  [92 %-100 %] 97 % (11/14 0955) Weight:  [81.6 kg (180 lb)-89.7 kg (197 lb 11.2 oz)] 89.7 kg (197 lb 11.2 oz) (11/14 0157)  CBC:   Recent Labs Lab 12/17/15 1234 12/17/15 1245  WBC 8.2  --   NEUTROABS 5.8  --   HGB 14.6 15.0  HCT 41.7 44.0  MCV 84.1  --   PLT 284  --     Basic Metabolic Panel:   Recent Labs Lab 12/17/15 1234 12/17/15 1245  NA 139 140  K 3.8 3.9  CL 105 104  CO2 25  --   GLUCOSE 97 94  BUN 25* 25*  CREATININE 0.66 0.70  CALCIUM 9.8  --     Lipid Panel:     Component Value Date/Time   CHOL 196 12/18/2015 0230   TRIG 114 12/18/2015 0230   HDL 56 12/18/2015 0230   CHOLHDL 3.5 12/18/2015 0230   VLDL 23 12/18/2015 0230   LDLCALC 117 (H) 12/18/2015 0230   HgbA1c: No results found for: HGBA1C Urine Drug Screen: No results found for: LABOPIA, COCAINSCRNUR, LABBENZ, AMPHETMU, THCU, LABBARB    IMAGING  Dg Chest 2 View  Result Date: 12/18/2015 CLINICAL DATA:  COPD, smoker EXAM: CHEST  2 VIEW COMPARISON:  11/08/2015 FINDINGS: Mild hyperinflation. Scarring in  the lingula. Right lung is clear. Heart is normal size. No effusions or acute bony abnormality. IMPRESSION: COPD/chronic changes.  No active disease. Electronically Signed   By: Charlett Nose M.D.   On: 12/18/2015 09:19   Ct Head Wo Contrast  Result Date: 12/17/2015 CLINICAL DATA:  Pt. C/o rt.sided weakness, bilateral blurred vision since this morning, slurred speech EXAM: CT HEAD WITHOUT CONTRAST TECHNIQUE: Contiguous axial images were obtained from the base of the skull through the vertex without intravenous contrast. COMPARISON:  12/03/2015 FINDINGS: Brain: No evidence of acute infarction, hemorrhage, hydrocephalus, extra-axial collection or mass lesion/mass effect. Vascular: No hyperdense vessel or unexpected  calcification. Skull: Normal. Negative for fracture or focal lesion. Sinuses/Orbits: No acute finding. Other: None IMPRESSION: No evidence for acute intracranial abnormality. Electronically Signed   By: Norva Pavlov M.D.   On: 12/17/2015 13:28   Mr Brain Wo Contrast  Result Date: 12/17/2015 CLINICAL DATA:  Right-sided weakness EXAM: MRI HEAD WITHOUT CONTRAST TECHNIQUE: Multiplanar, multiecho pulse sequences of the brain and surrounding structures were obtained without intravenous contrast. COMPARISON:  Head CT 12/17/2015 FINDINGS: Brain: There is a punctate focus of diffusion restriction The midline structures are normal. Minimal hyperintense T2 weighted signal within the left thalamus at the site of restricted diffusion. No mass lesion or midline shift. No hydrocephalus or extra-axial fluid collection. No age advanced or lobar predominant atrophy. Vascular: Major intracranial arterial and venous sinus flow voids are preserved. No evidence of chronic microhemorrhage or amyloid angiopathy. Skull and upper cervical spine: The visualized skull base, calvarium, upper cervical spine and extracranial soft tissues are normal. Sinuses/Orbits: No fluid levels or advanced mucosal thickening. No mastoid effusion. Normal orbits. IMPRESSION: Punctate focus of acute ischemia within the left thalamus. No hemorrhage or mass effect. Electronically Signed   By: Deatra Robinson M.D.   On: 12/17/2015 20:07   Mr [name] [name] Head/brain ZO Cm  Result Date: 12/18/2015 CLINICAL DATA:  65 year old female with double vision and tongue numbness. Acute left thalamic infarct. Subsequent encounter. EXAM: MRA HEAD WITHOUT CONTRAST TECHNIQUE: Angiographic images of the Circle of Willis were obtained using MRA technique without intravenous contrast. COMPARISON:  12/17/2015 head CT and brain MR. FINDINGS: Anterior circulation without medium or large size vessel significant stenosis or occlusion. Fetal contribution to the formation of the  posterior cerebral arteries bilaterally. No aneurysm noted. Right vertebral artery is dominant and ectatic without significant narrowing. Left vertebral artery is small in caliber throughout its course. Ectatic basilar artery without high-grade stenosis. Nonvisualized anterior inferior cerebellar artery bilaterally. Mild irregularity and narrowing of portions of the posterior inferior cerebellar artery and superior cerebellar artery bilaterally. Right posterior cerebral artery branch vessel narrowing and irregularity. IMPRESSION: Anterior circulation without medium or large size vessel significant stenosis or occlusion. 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 Ectatic right vertebral artery and basilar artery without significant narrowing. Nonvisualized anterior inferior cerebellar artery bilaterally. Right posterior cerebral artery branch vessel narrowing and irregularity. Mild irregularity and narrowing of portions of the posterior inferior cerebellar artery and superior cerebellar artery bilaterally. Electronically Signed   By: Lacy Duverney M.D.   On: 12/18/2015 09:16   Carotid Doppler   There is 1-39% bilateral ICA stenosis. Vertebral artery flow is antegrade.     PHYSICAL EXAM Pleasant middle aged hispanic lady not in distress. . Afebrile. Head is nontraumatic. Neck is supple without bruit.    Cardiac exam no murmur or gallop. Lungs are clear to auscultation. Distal pulses are well felt. Neurological Exam ;  Awake  Alert oriented x 3. Normal speech and language.eye movements full without nystagmus.fundi were not visualized. Vision acuity and fields appear normal. Hearing is normal. Palatal movements are normal. Face symmetric. Tongue midline. Normal strength, tone, reflexes and coordination. Normal sensation. Gait deferred.  ASSESSMENT/PLAN Ms. [name] [name] is a 65 y.o. female with history of COPD and tobacco abuse [facility] with double vision and slurred speech.  She did not receive IV t-PA due to delay in arrival.   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 B AICA not seen. Ectatic R VA and BA without narrowing  Carotid Doppler  No significant stenosis   2D Echo  pending  LDL 117  HgbA1c pending  SCDs ordered for VTE prophylaxis, not on during rounds Diet regular Room service appropriate? Yes; Fluid consistency: Thin  aspirin 325 mg daily prior to admission, now on aspirin 325 mg daily  Patient counseled to be compliant with her antithrombotic medications  Ongoing aggressive stroke risk factor management  Therapy recommendations:  pending   Disposition:  pending  Hypertension  Stable  Permissive hypertension (OK if < 220/120) but gradually normalize in 5-7 days  Long-term BP goal normotensive  Hyperlipidemia  Home meds:  No statin  LDL 117, goal < 70  Added lipitor 20 mg dialy  Continue statin at discharge  Other Stroke Risk Factors  Cigarette smoker, advised to stop smoking  Obesity, Body mass index is 32.9 kg/m., recommend weight loss, diet and exercise as appropriate   Migraines - recent scintillation scotoma   Hospital day # 0  Rhoderick MoodyBIBY,SHARON  [facility] See Amion for Pager information 12/18/2015 11:13 AM  I have personally examined this patient, reviewed notes, independently viewed imaging studies, participated in medical decision making and plan of care.ROS completed by me personally and pertinent positives fully documented  I have made any additions or clarifications directly to the above note. Agree with note above.  Patient counseled to be compliant with aspirin and to quit  smoking. Continue ongoing stroke evaluation. Greater than 50% time during this 35 minute visit was spent on counseling and coordination of care about stroke and TIA risk, prevention and treatment. Check Lyme titer given history of 4 tick bites during the summer Delia HeadyPramod Lya Holben, MD Medical Director [facility] Pager: [phone] 12/18/2015 5:48 PM  To contact Stroke Continuity provider, please refer to [email or web address]. After hours, contact General Neurology

## 2015-12-19 ENCOUNTER — Inpatient Hospital Stay (HOSPITAL_COMMUNITY): Payer: Self-pay

## 2015-12-19 DIAGNOSIS — I6789 Other cerebrovascular disease: Secondary | ICD-10-CM

## 2015-12-19 LAB — BASIC METABOLIC PANEL
ANION GAP: 8 (ref 5–15)
BUN: 14 mg/dL (ref 6–20)
CO2: 27 mmol/L (ref 22–32)
Calcium: 9.2 mg/dL (ref 8.9–10.3)
Chloride: 106 mmol/L (ref 101–111)
Creatinine, Ser: 0.7 mg/dL (ref 0.44–1.00)
GFR calc Af Amer: >60 mL/min (ref >60)
GFR calc non Af Amer: >60 mL/min (ref >60)
GLUCOSE: 88 mg/dL (ref 65–99)
POTASSIUM: 3.5 mmol/L (ref 3.5–5.1)
Sodium: 141 mmol/L (ref 135–145)

## 2015-12-19 LAB — ECHOCARDIOGRAM COMPLETE
HEIGHTINCHES: 65 in
Weight: 3163.2 oz

## 2015-12-19 LAB — CBC
HEMATOCRIT: 40.1 % (ref 36.0–46.0)
HEMOGLOBIN: 13.4 g/dL (ref 12.0–15.0)
MCH: 28.6 pg (ref 26.0–34.0)
MCHC: 33.4 g/dL (ref 30.0–36.0)
MCV: 85.7 fL (ref 78.0–100.0)
Platelets: 246 10*3/uL (ref 150–400)
RBC: 4.68 MIL/uL (ref 3.87–5.11)
RDW: 13.1 % (ref 11.5–15.5)
WBC: 7.8 10*3/uL (ref 4.0–10.5)

## 2015-12-19 LAB — HEMOGLOBIN A1C
Hgb A1c MFr Bld: 5.6 % (ref 4.8–5.6)
Mean Plasma Glucose: 114 mg/dL

## 2015-12-19 LAB — B. BURGDORFI ANTIBODIES: B burgdorferi Ab IgG+IgM: <0.91 {ISR} (ref 0.00–0.90)

## 2015-12-19 MED ORDER — INFLUENZA VAC SPLIT QUAD 0.5 ML IM SUSY
0.5000 mL | PREFILLED_SYRINGE | Freq: Once | INTRAMUSCULAR | Status: AC
  Administered 2015-12-19: 0.5 mL via INTRAMUSCULAR
  Filled 2015-12-19: qty 0.5

## 2015-12-19 MED ORDER — ASPIRIN 325 MG PO TABS
325.0000 mg | ORAL_TABLET | Freq: Every day | ORAL | Status: DC
  Administered 2015-12-19: 325 mg via ORAL
  Filled 2015-12-19: qty 1

## 2015-12-19 MED ORDER — ATORVASTATIN CALCIUM 20 MG PO TABS: 20.0000 mg | ORAL_TABLET | Freq: Every day | ORAL | 0 refills | Status: DC

## 2015-12-19 NOTE — Progress Notes (Signed)
  Echocardiogram 2D Echocardiogram has been performed.  Cathie BeamsGREGORY, Denys Labree 12/19/2015, 8:38 AM

## 2015-12-19 NOTE — Progress Notes (Signed)
Discharge orders received.  Discharge instructions and follow-up appointments reviewed with the patient and her son.  VSS upon discharge.  IV removed and education complete.  Transported out via wheelchair.   Ruey Storer M, RN 

## 2015-12-19 NOTE — Progress Notes (Signed)
Speech Language Pathology  Patient Details Name: [name] [name] MRN: [id number] DOB: 05/25/1950 Today's Date: 12/19/2015 Time:  -      Pt screened. No need for formal assessment of speech-lang-cognition.    Royce MacadamiaLitaker, Katelinn Justice Willis 12/19/2015, 9:44 AM    Breck CoonsLisa Willis Lonell FaceLitaker M.Ed [employer] Pager [phone]

## 2015-12-19 NOTE — Care Management Note (Signed)
Case Management Note  Patient Details  Name: [name] [name] MRN: [id number] Date of Birth: 1951/01/02  Subjective/Objective:     Pt admitted with CVA. She is from home with her son.                Action/Plan: No f/u per PT. CM met with the patient d/t no insurance listed. Pt does have a PCP and would like to continue with him. She states she had no prior home medications except for inhalers and had been able to afford them with the assistance of the [employer] company. Pt became very tearful during our conversation about affording medications. CM will follow for cost of d/c medications and see what assistance the patient may need.   Expected Discharge Date:   (unknown)               Expected Discharge Plan:  Home/Self Care  In-House Referral:     Discharge planning Services     Post Acute Care Choice:    Choice offered to:     DME Arranged:    DME Agency:     HH Arranged:    HH Agency:     Status of Service:  In process, will continue to follow  If discussed at Long Length of Stay Meetings, dates discussed:    Additional Comments:  Pollie Friar, RN 12/19/2015, 12:09 PM

## 2015-12-19 NOTE — Discharge Summary (Signed)
Physician Discharge Summary  [name] [name] GNF:[id number] DOB: Feb 09, 1950 DOA: 12/17/2015  PCP: Willow Ora, MD  Admit date: 12/17/2015 Discharge date: 12/19/2015  Time spent: 45 minutes  Recommendations for Outpatient Follow-up:  1. Dr.Sethi in 60month,  2. PCP Dr.Paz in 1 week, please FU Lymes titres, started on ASA and atorvastatin after CVA   Discharge Diagnoses:    CVA   COPD GOLD III   CVA (cerebral vascular accident) (HCC)   Tobacco abuse   Discharge Condition: stable  Diet recommendation: heart healthy  Filed Weights   12/18/15 0157  Weight: 89.7 kg (197 lb 11.2 oz)    History of present illness:  Pt. with PMH of COPD; admitted on 12/17/2015, with complaint of vision changes, was found to have thalamic infarct  Hospital Course:  1. ACute CVA/ Left thalamic embolic infarct. -with mild residual double vision -MRA mild narrowing, no significant stenosis. -Carotid Doppler no significant stenosis. -ECHO unremarable -LDL 117, started on Lipitor 20 mg daily. -started on aspirin 325 mg daily. - Hemoglobin A1c 5.6 -Smoking cessation counseled -Therpy eval completed, No PT FU recommended -FU wth Dr.Sethi in 1 month -FU Lyme titres  2. History of COPD. -stable, continue Symbicort   3. Active smoker. On nicotine patch.  Consultations: Neurology Dr.Sethi Discharge Exam: Vitals:   12/19/15 0500 12/19/15 0932  BP: 111/64 112/66  Pulse: 79 83  Resp: 16 14  Temp: 98.5 F (36.9 C) 98.4 F (36.9 C)    General: AAOx3 Cardiovascular: S1S2/RRR Respiratory: CTAB  Discharge Instructions   Discharge Instructions    Diet - low sodium heart healthy    Complete by:  As directed    Increase activity slowly    Complete by:  As directed      Current Discharge Medication List    START taking these medications   Details  atorvastatin (LIPITOR) 20 MG tablet Take 1 tablet (20 mg total) by mouth daily at 6 PM. Qty: 30 tablet, Refills: 0      CONTINUE these  medications which have NOT CHANGED   Details  acetaminophen (TYLENOL) 500 MG tablet Take 1,000 mg by mouth every 6 (six) hours as needed for moderate pain.    albuterol (PROVENTIL HFA;VENTOLIN HFA) 108 (90 BASE) MCG/ACT inhaler Inhale 1-2 puffs into the lungs every 6 (six) hours as needed for wheezing or shortness of breath. Qty: 1 Inhaler, Refills: 11    albuterol (PROVENTIL) (2.5 MG/3ML) 0.083% nebulizer solution Take 3 mLs (2.5 mg total) by nebulization every 4 (four) hours as needed for wheezing or shortness of breath. Qty: 75 mL, Refills: 12   Associated Diagnoses: COPD mixed type (HCC)    aspirin 325 MG tablet Take 325 mg by mouth once.    budesonide-formoterol (SYMBICORT) 160-4.5 MCG/ACT inhaler Take 2 puffs first thing in am and then another 2 puffs about 12 hours later. Qty: 3 Inhaler, Refills: 3    hydrOXYzine (ATARAX/VISTARIL) 10 MG tablet Take 1 tablet (10 mg total) by mouth 3 (three) times daily as needed. Qty: 40 tablet, Refills: 1    ibuprofen (ADVIL,MOTRIN) 200 MG tablet Take 400 mg by mouth every 6 (six) hours as needed for headache or moderate pain.      STOP taking these medications     pseudoephedrine (SUDAFED) 30 MG tablet        No Known Allergies Follow-up Information    Willow Ora, MD. Schedule an appointment as soon as possible for a visit in 1 week(s).   Specialty:  Internal  Medicine Why:  Please have Dr.Paz follow up Lyme disease titres Contact information: [address] [place] [id number] [phone]            The results of significant diagnostics from this hospitalization (including imaging, microbiology, ancillary and laboratory) are listed below for reference.    Significant Diagnostic Studies: Dg Chest 2 View  Result Date: 12/18/2015 CLINICAL DATA:  COPD, smoker EXAM: CHEST  2 VIEW COMPARISON:  11/08/2015 FINDINGS: Mild hyperinflation. Scarring in the lingula. Right lung is clear. Heart is normal size. No effusions  or acute bony abnormality. IMPRESSION: COPD/chronic changes.  No active disease. Electronically Signed   By: Charlett NoseKevin  Dover M.D.   On: 12/18/2015 09:19   Ct Head Wo Contrast  Result Date: 12/17/2015 CLINICAL DATA:  Pt. C/o rt.sided weakness, bilateral blurred vision since this morning, slurred speech EXAM: CT HEAD WITHOUT CONTRAST TECHNIQUE: Contiguous axial images were obtained from the base of the skull through the vertex without intravenous contrast. COMPARISON:  12/03/2015 FINDINGS: Brain: No evidence of acute infarction, hemorrhage, hydrocephalus, extra-axial collection or mass lesion/mass effect. Vascular: No hyperdense vessel or unexpected calcification. Skull: Normal. Negative for fracture or focal lesion. Sinuses/Orbits: No acute finding. Other: None IMPRESSION: No evidence for acute intracranial abnormality. Electronically Signed   By: Norva PavlovElizabeth  Brown M.D.   On: 12/17/2015 13:28   Ct Head Wo Contrast  Result Date: 12/03/2015 CLINICAL DATA:  Blurry vision beginning yesterday. Bitemporal and occipital headache. History of COPD. EXAM: CT HEAD WITHOUT CONTRAST TECHNIQUE: Contiguous axial images were obtained from the base of the skull through the vertex without intravenous contrast. COMPARISON:  None. FINDINGS: BRAIN: The ventricles and sulci are normal for age. No intraparenchymal hemorrhage, mass effect nor midline shift. Minimal supratentorial white matter hypodensities within normal range for patient's age, though non-specific are most compatible with chronic small vessel ischemic disease. No acute large vascular territory infarcts. No abnormal extra-axial fluid collections. Basal cisterns are patent. VASCULAR: Minimal calcific atherosclerosis of the carotid siphons. SKULL: No skull fracture. No significant scalp soft tissue swelling. Moderate temporomandibular osteoarthrosis. SINUSES/ORBITS: The mastoid air-cells and included paranasal sinuses are well-aerated.The included ocular globes and  orbital contents are non-suspicious. OTHER: None. IMPRESSION: Normal CT HEAD for age. Electronically Signed   By: Awilda Metroourtnay  Bloomer M.D.   On: 12/03/2015 14:13   Mr Brain Wo Contrast  Result Date: 12/17/2015 CLINICAL DATA:  Right-sided weakness EXAM: MRI HEAD WITHOUT CONTRAST TECHNIQUE: Multiplanar, multiecho pulse sequences of the brain and surrounding structures were obtained without intravenous contrast. COMPARISON:  Head CT 12/17/2015 FINDINGS: Brain: There is a punctate focus of diffusion restriction The midline structures are normal. Minimal hyperintense T2 weighted signal within the left thalamus at the site of restricted diffusion. No mass lesion or midline shift. No hydrocephalus or extra-axial fluid collection. No age advanced or lobar predominant atrophy. Vascular: Major intracranial arterial and venous sinus flow voids are preserved. No evidence of chronic microhemorrhage or amyloid angiopathy. Skull and upper cervical spine: The visualized skull base, calvarium, upper cervical spine and extracranial soft tissues are normal. Sinuses/Orbits: No fluid levels or advanced mucosal thickening. No mastoid effusion. Normal orbits. IMPRESSION: Punctate focus of acute ischemia within the left thalamus. No hemorrhage or mass effect. Electronically Signed   By: Deatra RobinsonKevin  Herman M.D.   On: 12/17/2015 20:07   Mr Maxine GlennMra Head/brain DGWo Cm  Result Date: 12/18/2015 CLINICAL DATA:  65 year old female with double vision and tongue numbness. Acute left thalamic infarct. Subsequent encounter. EXAM: MRA HEAD  WITHOUT CONTRAST TECHNIQUE: Angiographic images of the Circle of Willis were obtained using MRA technique without intravenous contrast. COMPARISON:  12/17/2015 head CT and brain MR. FINDINGS: Anterior circulation without medium or large size vessel significant stenosis or occlusion. Fetal contribution to the formation of the posterior cerebral arteries bilaterally. No aneurysm noted. Right vertebral artery is dominant  and ectatic without significant narrowing. Left vertebral artery is small in caliber throughout its course. Ectatic basilar artery without high-grade stenosis. Nonvisualized anterior inferior cerebellar artery bilaterally. Mild irregularity and narrowing of portions of the posterior inferior cerebellar artery and superior cerebellar artery bilaterally. Right posterior cerebral artery branch vessel narrowing and irregularity. IMPRESSION: Anterior circulation without medium or large size vessel significant stenosis or occlusion. 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 Ectatic right vertebral artery and basilar artery without significant narrowing. Nonvisualized anterior inferior cerebellar artery bilaterally. Right posterior cerebral artery branch vessel narrowing and irregularity. Mild irregularity and narrowing of portions of the posterior inferior cerebellar artery and superior cerebellar artery bilaterally. Electronically Signed   By: Lacy DuverneySteven  Olson M.D.   On: 12/18/2015 09:16    Microbiology: No results found for this or any previous visit (from the past 240 hour(s)).   Labs: Basic Metabolic Panel:  Recent Labs Lab 12/17/15 1234 12/17/15 1245 12/19/15 0236  NA 139 140 141  K 3.8 3.9 3.5  CL 105 104 106  CO2 25  --  27  GLUCOSE 97 94 88  BUN 25* 25* 14  CREATININE 0.66 0.70 0.70  CALCIUM 9.8  --  9.2   Liver Function Tests:  Recent Labs Lab 12/17/15 1234  AST 19  ALT 17  ALKPHOS 86  BILITOT 0.7  PROT 7.9  ALBUMIN 4.3   No results for input(s): LIPASE, AMYLASE in the last 168 hours. No results for input(s): AMMONIA in the last 168 hours. CBC:  Recent Labs Lab 12/17/15 1234 12/17/15 1245 12/19/15 0236  WBC 8.2  --  7.8  NEUTROABS 5.8  --   --   HGB 14.6 15.0 13.4  HCT 41.7 44.0 40.1  MCV 84.1  --  85.7  PLT 284  --  246   Cardiac Enzymes:  Recent Labs Lab 12/18/15 0230  TROPONINI <0.03   BNP: BNP (last 3 results) No results for  input(s): BNP in the last 8760 hours.  ProBNP (last 3 results) No results for input(s): PROBNP in the last 8760 hours.  CBG:  Recent Labs Lab 12/17/15 1153  GLUCAP 98       Signed:  Kenishia Plack MD.  [facility] 12/19/2015, 1:50 PM

## 2015-12-19 NOTE — Progress Notes (Signed)
STROKE TEAM PROGRESS NOTE    SUBJECTIVE (INTERVAL HISTORY) No family is at the bedside.  Overall she feels her condition is stable. She notes improvement in double vision.    OBJECTIVE Temp:  [98.2 F (36.8 C)-99 F (37.2 C)] 98.4 F (36.9 C) (11/15 0932) Pulse Rate:  [79-106] 83 (11/15 0932) Cardiac Rhythm: Normal sinus rhythm (11/15 0846) Resp:  [14-18] 14 (11/15 0932) BP: (105-133)/(59-81) 112/66 (11/15 0932) SpO2:  [95 %-98 %] 96 % (11/15 0932)  CBC:   Recent Labs Lab 12/17/15 1234 12/17/15 1245 12/19/15 0236  WBC 8.2  --  7.8  NEUTROABS 5.8  --   --   HGB 14.6 15.0 13.4  HCT 41.7 44.0 40.1  MCV 84.1  --  85.7  PLT 284  --  246    Basic Metabolic Panel:   Recent Labs Lab 12/17/15 1234 12/17/15 1245 12/19/15 0236  NA 139 140 141  K 3.8 3.9 3.5  CL 105 104 106  CO2 25  --  27  GLUCOSE 97 94 88  BUN 25* 25* 14  CREATININE 0.66 0.70 0.70  CALCIUM 9.8  --  9.2    Lipid Panel:     Component Value Date/Time   CHOL 196 12/18/2015 0230   TRIG 114 12/18/2015 0230   HDL 56 12/18/2015 0230   CHOLHDL 3.5 12/18/2015 0230   VLDL 23 12/18/2015 0230   LDLCALC 117 (H) 12/18/2015 0230   HgbA1c:  Lab Results  Component Value Date   HGBA1C 5.6 12/18/2015   Urine Drug Screen: No results found for: LABOPIA, COCAINSCRNUR, LABBENZ, AMPHETMU, THCU, LABBARB    IMAGING  Dg Chest 2 View  Result Date: 12/18/2015 CLINICAL DATA:  COPD, smoker EXAM: CHEST  2 VIEW COMPARISON:  11/08/2015 FINDINGS: Mild hyperinflation. Scarring in the lingula. Right lung is clear. Heart is normal size. No effusions or acute bony abnormality. IMPRESSION: COPD/chronic changes.  No active disease. Electronically Signed   By: Charlett NoseKevin  Dover M.D.   On: 12/18/2015 09:19   Mr Brain Wo Contrast  Result Date: 12/17/2015 CLINICAL DATA:  Right-sided weakness EXAM: MRI HEAD WITHOUT CONTRAST TECHNIQUE: Multiplanar, multiecho pulse sequences of the brain and surrounding structures were obtained without  intravenous contrast. COMPARISON:  Head CT 12/17/2015 FINDINGS: Brain: There is a punctate focus of diffusion restriction The midline structures are normal. Minimal hyperintense T2 weighted signal within the left thalamus at the site of restricted diffusion. No mass lesion or midline shift. No hydrocephalus or extra-axial fluid collection. No age advanced or lobar predominant atrophy. Vascular: Major intracranial arterial and venous sinus flow voids are preserved. No evidence of chronic microhemorrhage or amyloid angiopathy. Skull and upper cervical spine: The visualized skull base, calvarium, upper cervical spine and extracranial soft tissues are normal. Sinuses/Orbits: No fluid levels or advanced mucosal thickening. No mastoid effusion. Normal orbits. IMPRESSION: Punctate focus of acute ischemia within the left thalamus. No hemorrhage or mass effect. Electronically Signed   By: Deatra RobinsonKevin  Herman M.D.   On: 12/17/2015 20:07   Mr Maxine GlennMra Head/brain AVWo Cm  Result Date: 12/18/2015 CLINICAL DATA:  65 year old female with double vision and tongue numbness. Acute left thalamic infarct. Subsequent encounter. EXAM: MRA HEAD WITHOUT CONTRAST TECHNIQUE: Angiographic images of the Circle of Willis were obtained using MRA technique without intravenous contrast. COMPARISON:  12/17/2015 head CT and brain MR. FINDINGS: Anterior circulation without medium or large size vessel significant stenosis or occlusion. Fetal contribution to the formation of the posterior cerebral arteries bilaterally. No aneurysm noted. Right  vertebral artery is dominant and ectatic without significant narrowing. Left vertebral artery is small in caliber throughout its course. Ectatic basilar artery without high-grade stenosis. Nonvisualized anterior inferior cerebellar artery bilaterally. Mild irregularity and narrowing of portions of the posterior inferior cerebellar artery and superior cerebellar artery bilaterally. Right posterior cerebral artery branch  vessel narrowing and irregularity. IMPRESSION: Anterior circulation without medium or large size vessel significant stenosis or occlusion. 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 Ectatic right vertebral artery and basilar artery without significant narrowing. Nonvisualized anterior inferior cerebellar artery bilaterally. Right posterior cerebral artery branch vessel narrowing and irregularity. Mild irregularity and narrowing of portions of the posterior inferior cerebellar artery and superior cerebellar artery bilaterally. Electronically Signed   By: Lacy Duverney M.D.   On: 12/18/2015 09:16   Carotid Doppler   There is 1-39% bilateral ICA stenosis. Vertebral artery flow is antegrade.     PHYSICAL EXAM Pleasant middle aged hispanic lady not in distress. . Afebrile. Head is nontraumatic. Neck is supple without bruit.    Cardiac exam no murmur or gallop. Lungs are clear to auscultation. Distal pulses are well felt. Neurological Exam ;  Awake  Alert oriented x 3. Normal speech and language.eye movements full without nystagmus.fundi were not visualized. Vision acuity and fields appear normal. Hearing is normal. Palatal movements are normal. Face symmetric. Tongue midline. Normal strength, tone, reflexes and coordination. Normal sensation. Gait deferred.  ASSESSMENT/PLAN [name] [name] is a 65 y.o. female with history of COPD and tobacco abuse presenting [facility] with double vision and slurred speech. She did not receive IV t-PA due to delay in arrival.   Stroke:  left thalamic infarct embolic secondary to small vessel disease   Binocular diplopia likely due to partial left eye INO without visualizable brainstem infarct on MRI  Resultant  Double vision - anticipate recovery  Pirates patch, rotate q4h while awake to help with double vision  MRI  L thalamic infarct  MRA  Mild irreg and narrowing PICA and SCA, R PCA. B AICA not seen. Ectatic R VA and BA without  narrowing  Carotid Doppler  No significant stenosis   2D Echo  Unremarkable   LDL 117  HgbA1c 5.6  SCDs ordered for VTE prophylaxis, not on during rounds Diet regular Room service appropriate? Yes; Fluid consistency: Thin Diet - low sodium heart healthy  aspirin 325 mg daily prior to admission, now on aspirin 325 mg daily  Patient counseled to be compliant with her antithrombotic medications  Ongoing aggressive stroke risk factor management  Therapy recommendations:  No pt follow up  Disposition: home today.  We will sign off. Patient can go home today.   Hypertension  Stable  Permissive hypertension (OK if < 220/120) but gradually normalize in 5-7 days  Long-term BP goal normotensive  Hyperlipidemia  Home meds:  No statin  LDL 117, goal < 70  Added lipitor 20 mg dialy  Continue statin at discharge  Other Stroke Risk Factors  Cigarette smoker, advised to stop smoking  Obesity, Body mass index is 32.9 kg/m., recommend weight loss, diet and exercise as appropriate   Migraines - recent scintillation scotoma   Hospital day # 1  Ahmed, Elyn Peers M.D. PGY-3 I have personally examined this patient, reviewed notes, independently viewed imaging studies, participated in medical decision making and plan of care.ROS completed by me personally and pertinent positives fully documented  I have made any additions or clarifications directly to the above note. Agree with note above.  Follow-up as an outpatient in stroke clinic in 6 weeks. Delia HeadyPramod Sethi, MD Medical Director [facility] Pager: [phone] 12/19/2015 3:32 PM

## 2015-12-19 NOTE — Care Management Note (Signed)
Case Management Note  Patient Details  Name: [name] [name] MRN: [id number] Date of Birth: 12/06/1950  Subjective/Objective:                    Action/Plan: Pt discharging home with her son. CM noted that the only new medication at discharge is lipitor. CM found that [facility]itor 20mg  [facility]. Information given to the patient and she states this is affordable. Pt also asked about changing her PCP d/t the cost. [facility]. Bedside RN updated.   Expected Discharge Date:   (unknown)               Expected Discharge Plan:  Home/Self Care  In-House Referral:     Discharge planning Services     Post Acute Care Choice:    Choice offered to:     DME Arranged:    DME Agency:     HH Arranged:    HH Agency:     Status of Service:  Completed, signed off  If discussed at [employer] Length of Stay Meetings, dates discussed:    Additional Comments:  Kermit BaloKelli F Zeppelin Commisso, RN 12/19/2015, 2:35 PM

## 2015-12-19 NOTE — Progress Notes (Signed)
OT Cancellation Note  Patient Details Name: Namon CirriCarmen N Weyer MRN: [id number] DOB: 03/09/1950   Cancelled Treatment:    Reason Eval/Treat Not Completed: OT screened, no needs identified, will sign off. Pt states that her double vision had resolved and pt will d/c home this afternoon  Galen ManilaSpencer, Jalei Shibley Jeanette 12/19/2015, 2:55 PM

## 2015-12-20 ENCOUNTER — Telehealth: Payer: Self-pay | Admitting: *Deleted

## 2015-12-20 NOTE — Telephone Encounter (Signed)
Noted, thx.

## 2015-12-20 NOTE — Telephone Encounter (Signed)
Called pt, she asked me to speak to her son, [name] [name], who declines TCM at this time. States she is overall doing better since discharge and tolerating new medications (lipitor and aspirin), did have transient dizzy spell today but that has resolved. States she is going to follow-up with another provider within the next week as directed, but that appt w/ Dr. Drue NovelPaz is too expensive for her right now, so declined to schedule appt w/ our office. No further needs identified at time of call.

## 2015-12-24 ENCOUNTER — Other Ambulatory Visit: Payer: Self-pay | Admitting: *Deleted

## 2015-12-24 ENCOUNTER — Ambulatory Visit: Payer: Self-pay | Admitting: Family Medicine

## 2015-12-24 NOTE — Patient Outreach (Signed)
[facility]) Care Management  12/24/2015  [name] [name] 07/08/1950 [id number]  Referral via EMMI-Stroke; patient does not speak English:  Telephone call to patient; person who answered call identified themselves as patient's son  & stated that patient was not available; stated to call back tomorrow.   Stated they would be translating for patient & advised she would be able to give permission to them to speak with this RN CM.   Plan: Will follow up.  Colleen CanLinda Breniya Goertzen, RN BSN CCM Care Management Coordinator [facility] Care [facility]  [phone]

## 2015-12-25 ENCOUNTER — Other Ambulatory Visit: Payer: Self-pay | Admitting: *Deleted

## 2015-12-25 NOTE — Patient Outreach (Signed)
[facility] Care Management  12/25/2015  [name] [name] 06/28/1950 [id number]   Telephone call to patient who was advised of reason for call.  Patient gave HIPPA verification and was able to voice that she had recently been hospitalized with stroke. Patient voices that she speaks some English but something does not understand some of words. Patient states she currently lives with her son & gives him permission to speak with this RN care coordinator  about her concerns since hospital discharge.   Son/caregiver identifies himself as [name] [name].  States patient's questions were what type of food should she be eating & what should her activity level include. Advised that discharge instructions stated that patient should be on low salt healthy diet and that activity should be increased slowly. Caregiver was advised & given examples to elaborate on instructions.  Caregiver voices understanding.  Caregiver voices that patient was currently independent in her care & gets around without assistance.  States she now is able to walk to mailbox without difficulty. States she also is cooking; able to eat without difficulty.  Patient has some numbness at tip of some of fingers but had that upon discharge.  Son was advised of symptoms of stroke & action plan of calling 911. States patient received Stroke book upon discharge & voices understanding.   States [facility] & Neurologist have been scheduled for next week & he will be taking patient to all of MD visits.  States patient manages her own medications and is consistent with taking medications as prescribed by doctor. States no trouble getting medications . States only 2 new prescriptions of Aspirin & Lipitor were prescribed upon discharge from hospital.  States he is in the process of helping patient get health insurance.  States patient has stopped smoking.  Voices no further questions & did not  need further written stroke educational information or call backs from this RN case manager.  Plan: Close out case. Send to care management assistant.   Colleen CanLinda Oriya Kettering, RN BSN CCM Care Management Coordinator [facility] Care Management  [phone]

## 2016-01-01 ENCOUNTER — Encounter: Payer: Self-pay | Admitting: Internal Medicine

## 2016-01-01 ENCOUNTER — Ambulatory Visit: Payer: Self-pay | Attending: Internal Medicine | Admitting: Internal Medicine

## 2016-01-01 ENCOUNTER — Encounter: Payer: Self-pay | Admitting: Licensed Clinical Social Worker

## 2016-01-01 VITALS — BP 129/83 | HR 73 | Temp 97.9°F | Resp 16 | Wt 181.0 lb

## 2016-01-01 DIAGNOSIS — M25512 Pain in left shoulder: Secondary | ICD-10-CM | POA: Insufficient documentation

## 2016-01-01 DIAGNOSIS — Z0000 Encounter for general adult medical examination without abnormal findings: Secondary | ICD-10-CM

## 2016-01-01 DIAGNOSIS — Z23 Encounter for immunization: Secondary | ICD-10-CM | POA: Insufficient documentation

## 2016-01-01 DIAGNOSIS — J449 Chronic obstructive pulmonary disease, unspecified: Secondary | ICD-10-CM | POA: Insufficient documentation

## 2016-01-01 DIAGNOSIS — F411 Generalized anxiety disorder: Secondary | ICD-10-CM | POA: Insufficient documentation

## 2016-01-01 DIAGNOSIS — Z1159 Encounter for screening for other viral diseases: Secondary | ICD-10-CM | POA: Insufficient documentation

## 2016-01-01 DIAGNOSIS — F17210 Nicotine dependence, cigarettes, uncomplicated: Secondary | ICD-10-CM | POA: Insufficient documentation

## 2016-01-01 DIAGNOSIS — Z6830 Body mass index (BMI) 30.0-30.9, adult: Secondary | ICD-10-CM | POA: Insufficient documentation

## 2016-01-01 DIAGNOSIS — Z801 Family history of malignant neoplasm of trachea, bronchus and lung: Secondary | ICD-10-CM | POA: Insufficient documentation

## 2016-01-01 DIAGNOSIS — H532 Diplopia: Secondary | ICD-10-CM | POA: Insufficient documentation

## 2016-01-01 DIAGNOSIS — E669 Obesity, unspecified: Secondary | ICD-10-CM | POA: Insufficient documentation

## 2016-01-01 DIAGNOSIS — Z720 Tobacco use: Secondary | ICD-10-CM

## 2016-01-01 DIAGNOSIS — I6300 Cerebral infarction due to thrombosis of unspecified precerebral artery: Secondary | ICD-10-CM | POA: Insufficient documentation

## 2016-01-01 DIAGNOSIS — Z114 Encounter for screening for human immunodeficiency virus [HIV]: Secondary | ICD-10-CM | POA: Insufficient documentation

## 2016-01-01 LAB — TSH: TSH: 2.08 m[IU]/L

## 2016-01-01 LAB — HEPATITIS C ANTIBODY: HCV AB: NEGATIVE

## 2016-01-01 MED ORDER — ASPIRIN 325 MG PO TABS: 325.0000 mg | ORAL_TABLET | Freq: Once | ORAL | 0 refills | Status: AC

## 2016-01-01 MED ORDER — ATORVASTATIN CALCIUM 20 MG PO TABS: 20.0000 mg | ORAL_TABLET | Freq: Every day | ORAL | 3 refills | Status: DC

## 2016-01-01 MED ORDER — GABAPENTIN 300 MG PO CAPS: 300.0000 mg | ORAL_CAPSULE | Freq: Every day | ORAL | 2 refills | Status: DC

## 2016-01-01 MED ORDER — BUDESONIDE-FORMOTEROL FUMARATE 160-4.5 MCG/ACT IN AERO: INHALATION_SPRAY | RESPIRATORY_TRACT | 3 refills | Status: DC

## 2016-01-01 MED ORDER — ALBUTEROL SULFATE (2.5 MG/3ML) 0.083% IN NEBU: 2.5000 mg | INHALATION_SOLUTION | RESPIRATORY_TRACT | 12 refills | Status: DC | PRN

## 2016-01-01 MED ORDER — DICLOFENAC SODIUM 1 % TD GEL: 2.0000 g | Freq: Four times a day (QID) | TRANSDERMAL | 2 refills | Status: DC

## 2016-01-01 MED ORDER — ALBUTEROL SULFATE HFA 108 (90 BASE) MCG/ACT IN AERS: 1.0000 | INHALATION_SPRAY | Freq: Four times a day (QID) | RESPIRATORY_TRACT | 11 refills | Status: DC | PRN

## 2016-01-01 MED FILL — VENTOLIN HFA 90 MCG INHALER: 108 (90 BAS | 20 days supply | Qty: 18 | Fill #0

## 2016-01-01 MED FILL — ATORVASTATIN 20 MG TABLET: 20 | 30 days supply | Qty: 30 | Fill #0

## 2016-01-01 MED FILL — ALBUTEROL 0.083% INHAL SOLN: (2.5 MG/3ML | 5 days supply | Qty: 90 | Fill #0

## 2016-01-01 MED FILL — ?GABAPENTIN 300 MG CAPSULE: 300 | 30 days supply | Qty: 30 | Fill #0

## 2016-01-01 MED FILL — VOLTAREN 1% GEL: 1 | 12 days supply | Qty: 100 | Fill #0

## 2016-01-01 NOTE — Progress Notes (Signed)
Pt in the office today for hospital follow up Pt states both hands goes numb sometimes Pt states sometimes her right eye bothers her Pt states she wants to know how long does she has to be on cholesterol medicine

## 2016-01-01 NOTE — Patient Instructions (Addendum)
Financial packet.   Calcium 1264m /day Vit d - checking now   Td Vaccine (Tetanus and Diphtheria): What You Need to Know 1. Why get vaccinated? Tetanus  and diphtheria are very serious diseases. They are rare in the [place], but people who do become infected often have severe complications. Td vaccine is used to protect adolescents and adults from both of these diseases. Both tetanus and diphtheria are infections caused by bacteria. Diphtheria spreads from person to person through coughing or sneezing. Tetanus-causing bacteria enter the body through cuts, scratches, or wounds. TETANUS (lockjaw) causes painful muscle tightening and stiffness, usually all over the body.  It can lead to tightening of muscles in the head and neck so you can't open your mouth, swallow, or sometimes even breathe. Tetanus kills about 1 out of every 10 people who are infected even after receiving the best medical care. DIPHTHERIA can cause a thick coating to form in the back of the throat.  It can lead to breathing problems, paralysis, heart failure, and death. Before vaccines, as many as 200,000 cases of diphtheria and hundreds of cases of tetanus were reported in the [place]. Since vaccination began, reports of cases for both diseases have dropped by about 99%. 2. Td vaccine Td vaccine can protect adolescents and adults from tetanus and diphtheria. Td is usually given as a booster dose every 10 years but it can also be given earlier after a severe and dirty wound or burn. Another vaccine, called Tdap, which protects against pertussis in addition to tetanus and diphtheria, is sometimes recommended instead of Td vaccine. Your doctor or the person giving you the vaccine can give you more information. Td may safely be given at the same time as other vaccines. 3. Some people should not get this vaccine  A person who has ever had a life-threatening allergic reaction after a previous dose of  any tetanus or diphtheria containing vaccine, OR has a severe allergy to any part of this vaccine, should not get Td vaccine. Tell the person giving the vaccine about any severe allergies.  Talk to your doctor if you:  had severe pain or swelling after any vaccine containing diphtheria or tetanus,  ever had a condition called Guillain Barre Syndrome (GBS),  aren't feeling well on the day the shot is scheduled. 4. What are the risks from Td vaccine? With any medicine, including vaccines, there is a chance of side effects. These are usually mild and go away on their own. Serious reactions are also possible but are rare. Most people who get Td vaccine do not have any problems with it. Mild problems following Td vaccine: (Did not interfere with activities)  Pain where the shot was given (about 8 people in 10)  Redness or swelling where the shot was given (about 1 person in 4)  Mild fever (rare)  Headache (about 1 person in 4)  Tiredness (about 1 person in 4) Moderate problems following Td vaccine: (Interfered with activities, but did not require medical attention)  Fever over 102F (rare) Severe problems following Td vaccine: (Unable to perform usual activities; required medical attention)  Swelling, severe pain, bleeding and/or redness in the arm where the shot was given (rare). Problems that could happen after any vaccine:  People sometimes faint after a medical procedure, including vaccination. Sitting or lying down for about 15 minutes can help prevent fainting, and injuries caused by a fall. Tell your doctor if you feel dizzy, or have vision changes  or ringing in the ears.  Some people get severe pain in the shoulder and have difficulty moving the arm where a shot was given. This happens very rarely.  Any medication can cause a severe allergic reaction. Such reactions from a vaccine are very rare, estimated at fewer than 1 in a million doses, and would happen within a few  minutes to a few hours after the vaccination. As with any medicine, there is a very remote chance of a vaccine causing a serious injury or death. The safety of vaccines is always being monitored. For more information, visit: http://www.aguilar.org/ 5. What if there is a serious reaction? What should I look for? Look for anything that concerns you, such as signs of a severe allergic reaction, very high fever, or unusual behavior. Signs of a severe allergic reaction can include hives, swelling of the face and throat, difficulty breathing, a fast heartbeat, dizziness, and weakness. These would usually start a few minutes to a few hours after the vaccination. What should I do?  If you think it is a severe allergic reaction or other emergency that can't wait, call 9-1-1 or get the person to the nearest hospital. Otherwise, call your doctor.  Afterward, the reaction should be reported to the Vaccine Adverse Event Reporting System (VAERS). Your doctor might file this report, or you can do it yourself through the VAERS web site at www.vaers.SamedayNews.es, or by calling 337-231-8655.  VAERS does not give medical advice. 6. The National Vaccine Injury Compensation Program The [employer] Vaccine Injury Compensation Program (VICP) is a federal program that was created to compensate people who may have been injured by certain vaccines. Persons who believe they may have been injured by a vaccine can learn about the program and about filing a claim by calling 979-836-7247 or visiting the [employer] website at GoldCloset.com.ee. There is a time limit to file a claim for compensation. 7. How can I learn more?  Ask your doctor. He or she can give you the vaccine package insert or suggest other sources of information.  Call your local or state health department.  Contact the Centers for Disease Control and Prevention (CDC):  Call (323)568-6990 (1-800-CDC-INFO)  Visit CDC's website at  http://hunter.com/ CDC Td Vaccine VIS (05/15/15) This information is not intended to replace advice given to you by your health care provider. Make sure you discuss any questions you have with your health care provider. Document Released: 11/17/2005 Document Revised: 10/11/2015 Document Reviewed: 10/11/2015 Elsevier Interactive Patient Education  2017 [place] Maintenance for Postmenopausal Women Introduction Menopause is a normal process in which your reproductive ability comes to an end. This process happens gradually over a span of months to years, usually between the ages of 26 and 30. Menopause is complete when you have missed 12 consecutive menstrual periods. It is important to talk with your health care provider about some of the most common conditions that affect postmenopausal women, such as heart disease, cancer, and bone loss (osteoporosis). Adopting a healthy lifestyle and getting preventive care can help to promote your health and wellness. Those actions can also lower your chances of developing some of these common conditions. What should I know about menopause? During menopause, you may experience a number of symptoms, such as:  Moderate-to-severe hot flashes.  Night sweats.  Decrease in sex drive.  Mood swings.  Headaches.  Tiredness.  Irritability.  Memory problems.  Insomnia. Choosing to treat or not to treat menopausal changes is an  individual decision that you make with your health care provider. What should I know about hormone replacement therapy and supplements? Hormone therapy products are effective for treating symptoms that are associated with menopause, such as hot flashes and night sweats. Hormone replacement carries certain risks, especially as you become older. If you are thinking about using estrogen or estrogen with progestin treatments, discuss the benefits and risks with your health care provider. What should I know about  heart disease and stroke? Heart disease, heart attack, and stroke become more likely as you age. This may be due, in part, to the hormonal changes that your body experiences during menopause. These can affect how your body processes dietary fats, triglycerides, and cholesterol. Heart attack and stroke are both medical emergencies. There are many things that you can do to help prevent heart disease and stroke:  Have your blood pressure checked at least every 1-2 years. High blood pressure causes heart disease and increases the risk of stroke.  If you are 67-62 years old, ask your health care provider if you should take aspirin to prevent a heart attack or a stroke.  Do not use any tobacco products, including cigarettes, chewing tobacco, or electronic cigarettes. If you need help quitting, ask your health care provider.  It is important to eat a healthy diet and maintain a healthy weight.  Be sure to include plenty of vegetables, fruits, low-fat dairy products, and lean protein.  Avoid eating foods that are high in solid fats, added sugars, or salt (sodium).  Get regular exercise. This is one of the most important things that you can do for your health.  Try to exercise for at least 150 minutes each week. The type of exercise that you do should increase your heart rate and make you sweat. This is known as moderate-intensity exercise.  Try to do strengthening exercises at least twice each week. Do these in addition to the moderate-intensity exercise.  Know your numbers.Ask your health care provider to check your cholesterol and your blood glucose. Continue to have your blood tested as directed by your health care provider. What should I know about cancer screening? There are several types of cancer. Take the following steps to reduce your risk and to catch any cancer development as early as possible. Breast Cancer  Practice breast self-awareness.  This means understanding how your breasts  normally appear and feel.  It also means doing regular breast self-exams. Let your health care provider know about any changes, no matter how small.  If you are 71 or older, have a clinician do a breast exam (clinical breast exam or CBE) every year. Depending on your age, family history, and medical history, it may be recommended that you also have a yearly breast X-ray (mammogram).  If you have a family history of breast cancer, talk with your health care provider about genetic screening.  If you are at high risk for breast cancer, talk with your health care provider about having an MRI and a mammogram every year.  Breast cancer (BRCA) gene test is recommended for women who have family members with BRCA-related cancers. Results of the assessment will determine the need for genetic counseling and BRCA1 and for BRCA2 testing. BRCA-related cancers include these types:  Breast. This occurs in males or females.  Ovarian.  Tubal. This may also be called fallopian tube cancer.  Cancer of the abdominal or pelvic lining (peritoneal cancer).  Prostate.  Pancreatic. Cervical, Uterine, and Ovarian Cancer  Your health care  provider may recommend that you be screened regularly for cancer of the pelvic organs. These include your ovaries, uterus, and vagina. This screening involves a pelvic exam, which includes checking for microscopic changes to the surface of your cervix (Pap test).  For women ages 21-65, health care providers may recommend a pelvic exam and a Pap test every three years. For women ages 68-65, they may recommend the Pap test and pelvic exam, combined with testing for human papilloma virus (HPV), every five years. Some types of HPV increase your risk of cervical cancer. Testing for HPV may also be done on women of any age who have unclear Pap test results.  Other health care providers may not recommend any screening for nonpregnant women who are considered low risk for pelvic cancer and  have no symptoms. Ask your health care provider if a screening pelvic exam is right for you.  If you have had past treatment for cervical cancer or a condition that could lead to cancer, you need Pap tests and screening for cancer for at least 20 years after your treatment. If Pap tests have been discontinued for you, your risk factors (such as having a new sexual partner) need to be reassessed to determine if you should start having screenings again. Some women have medical problems that increase the chance of getting cervical cancer. In these cases, your health care provider may recommend that you have screening and Pap tests more often.  If you have a family history of uterine cancer or ovarian cancer, talk with your health care provider about genetic screening.  If you have vaginal bleeding after reaching menopause, tell your health care provider.  There are currently no reliable tests available to screen for ovarian cancer. Lung Cancer  Lung cancer screening is recommended for adults 89-60 years old who are at high risk for lung cancer because of a history of smoking. A yearly low-dose CT scan of the lungs is recommended if you:  Currently smoke.  Have a history of at least 30 pack-years of smoking and you currently smoke or have quit within the past 15 years. A pack-year is smoking an average of one pack of cigarettes per day for one year. Yearly screening should:  Continue until it has been 15 years since you quit.  Stop if you develop a health problem that would prevent you from having lung cancer treatment. Colorectal Cancer  This type of cancer can be detected and can often be prevented.  Routine colorectal cancer screening usually begins at age 29 and continues through age 37.  If you have risk factors for colon cancer, your health care provider may recommend that you be screened at an earlier age.  If you have a family history of colorectal cancer, talk with your health care  provider about genetic screening.  Your health care provider may also recommend using home test kits to check for hidden blood in your stool.  A small camera at the end of a tube can be used to examine your colon directly (sigmoidoscopy or colonoscopy). This is done to check for the earliest forms of colorectal cancer.  Direct examination of the colon should be repeated every 5-10 years until age 72. However, if early forms of precancerous polyps or small growths are found or if you have a family history or genetic risk for colorectal cancer, you may need to be screened more often. Skin Cancer  Check your skin from head to toe regularly.  Monitor any moles. Be  sure to tell your health care provider:  About any new moles or changes in moles, especially if there is a change in a mole's shape or color.  If you have a mole that is larger than the size of a pencil eraser.  If any of your family members has a history of skin cancer, especially at a young age, talk with your health care provider about genetic screening.  Always use sunscreen. Apply sunscreen liberally and repeatedly throughout the day.  Whenever you are outside, protect yourself by wearing long sleeves, pants, a wide-brimmed hat, and sunglasses. What should I know about osteoporosis? Osteoporosis is a condition in which bone destruction happens more quickly than new bone creation. After menopause, you may be at an increased risk for osteoporosis. To help prevent osteoporosis or the bone fractures that can happen because of osteoporosis, the following is recommended:  If you are 44-75 years old, get at least 1,000 mg of calcium and at least 600 mg of vitamin D per day.  If you are older than age 39 but younger than age 6, get at least 1,200 mg of calcium and at least 600 mg of vitamin D per day.  If you are older than age 77, get at least 1,200 mg of calcium and at least 800 mg of vitamin D per day. Smoking and excessive  alcohol intake increase the risk of osteoporosis. Eat foods that are rich in calcium and vitamin D, and do weight-bearing exercises several times each week as directed by your health care provider. What should I know about how menopause affects my mental health? Depression may occur at any age, but it is more common as you become older. Common symptoms of depression include:  Low or sad mood.  Changes in sleep patterns.  Changes in appetite or eating patterns.  Feeling an overall lack of motivation or enjoyment of activities that you previously enjoyed.  Frequent crying spells. Talk with your health care provider if you think that you are experiencing depression. What should I know about immunizations? It is important that you get and maintain your immunizations. These include:  Tetanus, diphtheria, and pertussis (Tdap) booster vaccine.  Influenza every year before the flu season begins.  Pneumonia vaccine.  Shingles vaccine. Your health care provider may also recommend other immunizations. This information is not intended to replace advice given to you by your health care provider. Make sure you discuss any questions you have with your health care provider. Document Released: 03/14/2005 Document Revised: 08/10/2015 Document Reviewed: 10/24/2014  2017 Elsevier   -  Steps to Quit Smoking Smoking tobacco can be bad for your health. It can also affect almost every organ in your body. Smoking puts you and people around you at risk for many serious long-lasting (chronic) diseases. Quitting smoking is hard, but it is one of the best things that you can do for your health. It is never too late to quit. What are the benefits of quitting smoking? When you quit smoking, you lower your risk for getting serious diseases and conditions. They can include:  Lung cancer or lung disease.  Heart disease.  Stroke.  Heart attack.  Not being able to have children (infertility).  Weak bones  (osteoporosis) and broken bones (fractures). If you have coughing, wheezing, and shortness of breath, those symptoms may get better when you quit. You may also get sick less often. If you are pregnant, quitting smoking can help to lower your chances of having a baby of  low birth weight. What can I do to help me quit smoking? Talk with your doctor about what can help you quit smoking. Some things you can do (strategies) include:  Quitting smoking totally, instead of slowly cutting back how much you smoke over a period of time.  Going to in-person counseling. You are more likely to quit if you go to many counseling sessions.  Using resources and support systems, such as:  Online chats with a Social worker.  Phone quitlines.  Printed Furniture conservator/restorer.  Support groups or group counseling.  Text messaging programs.  Mobile phone apps or applications.  Taking medicines. Some of these medicines may have nicotine in them. If you are pregnant or breastfeeding, do not take any medicines to quit smoking unless your doctor says it is okay. Talk with your doctor about counseling or other things that can help you. Talk with your doctor about using more than one strategy at the same time, such as taking medicines while you are also going to in-person counseling. This can help make quitting easier. What things can I do to make it easier to quit? Quitting smoking might feel very hard at first, but there is a lot that you can do to make it easier. Take these steps:  Talk to your family and friends. Ask them to support and encourage you.  Call phone quitlines, reach out to support groups, or work with a Social worker.  Ask people who smoke to not smoke around you.  Avoid places that make you want (trigger) to smoke, such as:  Bars.  Parties.  Smoke-break areas at work.  Spend time with people who do not smoke.  Lower the stress in your life. Stress can make you want to smoke. Try these things to  help your stress:  Getting regular exercise.  Deep-breathing exercises.  Yoga.  Meditating.  Doing a body scan. To do this, close your eyes, focus on one area of your body at a time from head to toe, and notice which parts of your body are tense. Try to relax the muscles in those areas.  Download or buy apps on your mobile phone or tablet that can help you stick to your quit plan. There are many free apps, such as QuitGuide from the [employer] Office manager for Disease Control and Prevention). You can find more support from smokefree.gov and other websites. This information is not intended to replace advice given to you by your health care provider. Make sure you discuss any questions you have with your health care provider. Document Released: 11/16/2008 Document Revised: 09/18/2015 Document Reviewed: 06/06/2014 Elsevier Interactive Patient Education  2017 [employer].

## 2016-01-01 NOTE — Progress Notes (Signed)
[name] [name], is a 65 y.o. female  ZOX:[id number]  [id number]  DOB - 05/08/1950  CC:  Chief Complaint  Patient presents with  . Hospitalization Follow-up       HPI: [name] [name] is a 65 y.o. female here today to establish medical care.  To our clinic recently hospitalization from 11/13 to 12/19/2015 for acute cerebrovascular accident in the left thalamic embolic infarct with mild residual double vision, she was started on aspirin 325 and statin, Lipitor 20 mg daily, at the time.  Of note, she says her double vision, has since improved. She is also saw an optometrist a few days after her first ER visit (12/03/15) and they did not see anything acute on that exam.  She has subsequently reduced smoking for the last week or so, about a pack a day. 2-3 cigarettes a day now.  Denies alcohol  Complains of left shoulder pain at times from her history of frozen shoulder, as well as an irritation under her left breast from prior VATS procedure. Denies rash  Patient has No headache, No chest pain, No abdominal pain - No Nausea, No new weakness tingling or numbness, No Cough - SOB.  He is here with her adult son.     Review of Systems: Per hpi, o/w all systems reviewed  And neg.     No Known Allergies Past Medical History:  Diagnosis Date  . COPD (chronic obstructive pulmonary disease) (HCC)   . Diverticulitis    Current Outpatient Prescriptions on File Prior to Visit  Medication Sig Dispense Refill  . acetaminophen (TYLENOL) 500 MG tablet Take 1,000 mg by mouth every 6 (six) hours as needed for moderate pain.    . hydrOXYzine (ATARAX/VISTARIL) 10 MG tablet Take 1 tablet (10 mg total) by mouth 3 (three) times daily as needed. (Patient not taking: Reported on 01/01/2016) 40 tablet 1  . ibuprofen (ADVIL,MOTRIN) 200 MG tablet Take 400 mg by mouth every 6 (six) hours as needed for headache or moderate pain.     No current facility-administered medications on file prior to visit.     Family History  Problem Relation Age of Onset  . Lung cancer Father   . Lung cancer Brother     type?  . Colon cancer Neg Hx   . Breast cancer Neg Hx   . CAD Neg Hx   . Diabetes Neg Hx    Social History   Social History  . Marital status: Divorced    Spouse name: N/A  . Number of children: 1  . Years of education: N/A   Occupational History  . not working currently    Social History Main Topics  . Smoking status: Current Every Day Smoker  . Smokeless tobacco: Never Used     Comment: started age in her late teens; +/- 1 ppd  . Alcohol use No  . Drug use: No  . Sexual activity: Not on file   Other Topics Concern  . Not on file   Social History Narrative   From [place] , father from [place]   Living in [place] since the 90s   Household- pt and son       Objective:   Vitals:   01/01/16 0909  BP: 129/83  Pulse: 73  Resp: 16  Temp: 97.9 F (36.6 C)    Filed Weights   01/01/16 0909  Weight: 181 lb (82.1 kg)    BP Readings from Last 3 Encounters:  01/01/16 129/83  12/19/15 112/66  12/03/15 128/88    Physical Exam: Constitutional: Patient appears well-developed and well-nourished. No distress. AAOx3, pleasant. HENT: Normocephalic, atraumatic, External right and left ear normal. Oropharynx is clear and moist.   Eyes: Conjunctivae and EOM are normal. PERRL, no scleral icterus.   No nystagmus /ptosis. Neck: Normal ROM. Neck supple. No JVD.  CVS: RRR, S1/S2 +, no murmurs, no gallops, no carotid bruit.  Pulmonary: Effort and breath sounds normal, no stridor, rhonchi, wheezes, rales.  Abdominal: Soft. BS +, obese, no distension, tenderness, rebound or guarding.  Musculoskeletal: Normal range of motion. No edema and no tenderness.  LE: bilat/ no c/c/e, pulses 2+ bilateral. Neuro: Alert. muscle tone coordination wnl. No cranial nerve deficit grossly. Skin: Skin is warm and dry. No rash noted. Not diaphoretic. No erythema. No pallor. Psychiatric: Normal mood and  affect. Behavior, judgment, thought content normal.  Lab Results  Component Value Date   WBC 7.8 12/19/2015   HGB 13.4 12/19/2015   HCT 40.1 12/19/2015   MCV 85.7 12/19/2015   PLT 246 12/19/2015   Lab Results  Component Value Date   CREATININE 0.70 12/19/2015   BUN 14 12/19/2015   NA 141 12/19/2015   K 3.5 12/19/2015   CL 106 12/19/2015   CO2 27 12/19/2015    Lab Results  Component Value Date   HGBA1C 5.6 12/18/2015   Lipid Panel     Component Value Date/Time   CHOL 196 12/18/2015 0230   TRIG 114 12/18/2015 0230   HDL 56 12/18/2015 0230   CHOLHDL 3.5 12/18/2015 0230   VLDL 23 12/18/2015 0230   LDLCALC 117 (H) 12/18/2015 0230        Depression screen [facility] 2/9 01/01/2016 12/25/2015 03/12/2015  Decreased Interest 2 0 0  Down, Depressed, Hopeless 3 0 0  PHQ - 2 Score 5 0 0  Altered sleeping 1 - -  Tired, decreased energy 1 - -  Change in appetite 2 - -  Feeling bad or failure about yourself  0 - -  Trouble concentrating 0 - -  Moving slowly or fidgety/restless 1 - -  Suicidal thoughts 0 - -  PHQ-9 Score 10 - -    Assessment and plan:   1. Cerebrovascular accident (CVA) due to thrombosis of precerebral artery (HCC) Encouraged to continue asa 325 and lipitor 20mg   - has appt w/ Neuro tomorw, encouraged to keep.  2. Obesity (BMI 30-39.9) Increase exercise recd,   3. COPD GOLD III, controlled Total tob cessation recd. - renewed symbicort - albuterol (PROVENTIL) (2.5 MG/3ML) 0.083% nebulizer solution; Take 3 mLs (2.5 mg total) by nebulization every 4 (four) hours as needed for wheezing or shortness of breath.  Dispense: 75 mL; Refill: 12  4. Tobacco abuse Total cessation recd  5. Left shoulder discomfort and irritation post VATs procedure - trial neurontin 300qhs and voltarin gel prn  6. Anxiety state Was seen  By SW this am, appreciate assistance, more related to boredom and unable to work at this time. No si/hi/avh  7. Healthcare maintenance - TSH -  VITAMIN D 25 Hydroxy (Vit-D Deficiency, Fractures) - tdap today - flu vac today  7. Need for hepatitis C screening test - Hepatitis C antibody  8. Encounter for screening for HIV - HIV antibody (with reflex)  9. Financial aid packet /for mm and colonoscopy.  Return in about 4 weeks (around 01/29/2016) for pap.  The patient was given clear instructions to go to ER or return to medical center if symptoms don't improve,  worsen or new problems develop. The patient verbalized understanding. The patient was told to call to get lab results if they haven't heard anything in the next week.    This note has been created with Education officer, environmental. Any transcriptional errors are unintentional.   Pete Glatter, MD, MBA/MHA [facility] [place], [place] [phone]   01/01/2016, 1:30 PM

## 2016-01-01 NOTE — BH Specialist Note (Signed)
Session Start time: 9:25 am   End Time: 9:50 am Total Time:  25 minutes Type of Service: Behavioral Health - Individual/Family Interpreter: No.   Interpreter Name & Language: N/A # [facility]-June 2018: [facility]   SUBJECTIVE: [name] [name] is a 65 y.o. female  Pt. was referred by Dr. Julien NordmannLangeland for:  anxiety and depression. Pt. reports the following symptoms/concerns: overwhelming feelings of nervousness  Duration of problem:  Approx 1 year after Thoracoscopy Severity: moderately severe Previous treatment: None reported   OBJECTIVE: Mood: Anxious & Affect: Appropriate and Tearful Risk of harm to self or others: Pt denied SI/HI Assessments administered: PHQ-9; GAD-7  LIFE CONTEXT:  Family & Social: Pt resides with adult son. Pt reports having two sisters and one brother who resides nearby School/ Work: Pt is unemployed Self-Care: Pt began a new exercise regimen walking around neighborhood. Pt recently stopped smoking cigarettes; however, reported last use was 12/31/15 (two cigarettes) Life changes: Pt recently had a stroke and has had to stop working as a result. Pt has difficulty keeping self busy due to no employment What is important to pt/family (values): Family   GOALS ADDRESSED:  Decrease symptoms of depression Decrease symptoms of anxiety  INTERVENTIONS: Solution Focused, Strength-based and Supportive   ASSESSMENT:  Pt currently experiencing depression and anxiety triggered by unemployment after a recent stroke. Pt reports overwhelming feelings of nervousness. Pt receives support from family who resides nearby. Pt may benefit from pyscho education, psychotherapy, and medication management. LCSWA discussed cycle of anxiety and depression and discussed different activities pt can participate in to decrease symptoms. Pt identified healthy coping skills to utilize on a weekly basis to decrease symptoms. LCSWA offered pt resources on Quitline and a community center that  offers free exercise classes.          PLAN: 1. F/U with behavioral health clinician: Pt was encouraged to contact LCSWA if symptoms worsen or fail to improve to [facility]. 2. Behavioral Health meds: None reported 3. Behavioral recommendations: LCSWA recommends that pt apply healthy coping skills discussed. Pt is encouraged to schedule follow up appointment with LCSWA 4. Referral: Brief Counseling/Psychotherapy, [facility], Problem-solving teaching/coping strategies, Psychoeducation and Supportive Counseling 5. From scale of 1-10, how likely are you to follow plan: 7/10   Bridgett LarssonJasmine D Bryn Perkin, MSW, [facility]WA  Clinical Social Worker 01/01/16 12:23 PM  Warmhandoff:   Warm Hand Off Completed.

## 2016-01-02 ENCOUNTER — Encounter: Payer: Self-pay | Admitting: Neurology

## 2016-01-02 ENCOUNTER — Ambulatory Visit (INDEPENDENT_AMBULATORY_CARE_PROVIDER_SITE_OTHER): Payer: Self-pay | Admitting: Neurology

## 2016-01-02 VITALS — BP 119/83 | HR 77 | Ht 64.0 in | Wt 181.6 lb

## 2016-01-02 DIAGNOSIS — I6381 Other cerebral infarction due to occlusion or stenosis of small artery: Secondary | ICD-10-CM

## 2016-01-02 DIAGNOSIS — I63212 Cerebral infarction due to unspecified occlusion or stenosis of left vertebral arteries: Secondary | ICD-10-CM

## 2016-01-02 DIAGNOSIS — I6322 Cerebral infarction due to unspecified occlusion or stenosis of basilar arteries: Secondary | ICD-10-CM

## 2016-01-02 LAB — HIV ANTIBODY (ROUTINE TESTING W REFLEX): HIV 1&2 Ab, 4th Generation: NONREACTIVE

## 2016-01-02 LAB — VITAMIN D 25 HYDROXY (VIT D DEFICIENCY, FRACTURES): Vit D, 25-Hydroxy: 35 ng/mL (ref 30–100)

## 2016-01-02 NOTE — Progress Notes (Signed)
[facility]    Provider:  Dr Lucia GaskinsAhern Referring Provider: Pete Glatterawn T Langeland, MD Primary Care Physician:  Pete Glatterawn T Langeland, MD  CC:  Follow up fo stroke  HPI:  [name] [name] is a 65 y.o. female here as a referral from Dr. Drue NovelPaz for lacunar stroke. [name] SchlatterCarmen N Combsis an 65 y.o.femalewho initially presented [facility] with a chief complaint of right lower extremity weakness and difficulty speaking. She first noticed the symptoms at 6 AM on 12/17/15 (LKW). She also had been having an odd sensation in her left eye consisting of shimmering lights of an and for 2 weeks. Between 1 and 4 days before being seen in the ED she began experiencing double vision, which goes away when she closes either eye. Three days ago she saw an ophthalmologist who diagnosed her with bilateral cataracts. Seen in the ED 2 weeks prior for the shimmering lights phenomenon; CT was performed at that time, which was negative. Repeat CT on 11/13 also was negative. Shehad an MRI scan of her brain, which revealed a punctate-to-small subacute ischemic infarction within her left thalamus. She has had mild posterior headache for the last few days. Her double vision improved in the hospital. She is going to have the cataracts removed. She has quit smoking. She is taking a daily aspirin now. Denies numbness, tingling, weakness. She smoked a pack of cigarettes for 40 years. Here with her son who interprets and also provides information,   Reviewed images and agree with the following MRI brain:   Reviewed notes, labs and imaging from outside physicians, which showed:  IMPRESSION: Punctate focus of acute ischemia within the left thalamus. No hemorrhage or mass effect.  ldl 117 hgba1c 5.6  Review of Systems: Patient complains of symptoms per HPI as well as the following symptoms: No CP, SOB. Pertinent negatives per HPI. All others negative.   Social History   Social History  . Marital status: Divorced    Spouse name: N/A  . Number of children: 1  . Years of education: Some College   Occupational History  . not working currently    Social History Main Topics  . Smoking status: Former Games developermoker  . Smokeless tobacco: Never Used     Comment: started age in her late teens; +/- 1 ppd  . Alcohol use No  . Drug use: No  . Sexual activity: Not on file   Other Topics Concern  . Not on file   Social History Narrative   Lives with son, [place]   Caffeine use: Coffee- 4-5 cups per day   From [place] , father from [place]   Living in [place] since the 90s   Household- pt and son       Family History  Problem Relation Age of Onset  . Lung cancer Father   . Lung cancer Brother     type?  . Colon cancer Neg Hx   . Breast cancer Neg Hx   . CAD Neg Hx   . Diabetes Neg Hx     Past Medical History:  Diagnosis Date  . Anxiety   . COPD (chronic obstructive pulmonary disease) (HCC)   . Diverticulitis   . Migraines     Past Surgical History:  Procedure Laterality Date  . CESAREAN SECTION    . STAPLING OF BLEBS Left 10/31/2014   Procedure: STAPLING OF BLEBS;  Surgeon: Alleen BorneBryan K Bartle, MD;  Location: [facility] OR;  Service: Thoracic;  Laterality: Left;  Marland Kitchen. VIDEO  ASSISTED THORACOSCOPY Left 10/31/2014   Procedure: LEFT VIDEO ASSISTED THORACOSCOPY;  Surgeon: Alleen BorneBryan K Bartle, MD;  Location: [facility] OR;  Service: Thoracic;  Laterality: Left;    Current Outpatient Prescriptions  Medication Sig Dispense Refill  . acetaminophen (TYLENOL) 500 MG tablet Take 1,000 mg by mouth every 6 (six) hours as needed for moderate pain.    Marland Kitchen. albuterol (PROVENTIL HFA;VENTOLIN HFA) 108 (90 Base) MCG/ACT inhaler Inhale 1-2 puffs into the lungs every 6 (six) hours as needed for wheezing or shortness of breath. 1 Inhaler 11  . albuterol (PROVENTIL) (2.5 MG/3ML) 0.083% nebulizer solution Take 3 mLs (2.5 mg total) by nebulization every 4 (four) hours as needed for wheezing or shortness of breath. 75 mL 12  . aspirin 325 MG tablet Take 325  mg by mouth daily.    Marland Kitchen. atorvastatin (LIPITOR) 20 MG tablet Take 1 tablet (20 mg total) by mouth daily at 6 PM. 90 tablet 3  . budesonide-formoterol (SYMBICORT) 160-4.5 MCG/ACT inhaler Take 2 puffs first thing in am and then another 2 puffs about 12 hours later. 3 Inhaler 3  . diclofenac sodium (VOLTAREN) 1 % GEL Apply 2 g topically 4 (four) times daily. 100 g 2  . gabapentin (NEURONTIN) 300 MG capsule Take 1 capsule (300 mg total) by mouth at bedtime. 90 capsule 2  . ibuprofen (ADVIL,MOTRIN) 200 MG tablet Take 400 mg by mouth every 6 (six) hours as needed for headache or moderate pain.    . hydrOXYzine (ATARAX/VISTARIL) 10 MG tablet Take 1 tablet (10 mg total) by mouth 3 (three) times daily as needed. (Patient not taking: Reported on 01/02/2016) 40 tablet 1   No current facility-administered medications for this visit.     Allergies as of 01/02/2016  . (No Known Allergies)    Vitals: BP 119/83 (BP Location: Right Arm, Patient Position: Sitting, Cuff Size: Normal)   Pulse 77   Ht 5\' 4"  (1.626 m)   Wt 181 lb 9.6 oz (82.4 kg)   BMI 31.17 kg/m  Last Weight:  Wt Readings from Last 1 Encounters:  01/02/16 181 lb 9.6 oz (82.4 kg)   Last Height:   Ht Readings from Last 1 Encounters:  01/02/16 5\' 4"  (1.626 m)    Physical exam: Exam: Gen: NAD, conversant, well nourised               CV: RRR, no MRG. No Carotid Bruits. No peripheral edema, warm, nontender Eyes: Conjunctivae clear without exudates or hemorrhage  Neuro: Detailed Neurologic Exam  Speech:    Speech is normal; fluent and spontaneous with normal comprehension.  Cognition:    The patient is oriented to person, place, and time;     recent and remote memory intact;     language fluent;     normal attention, concentration,     fund of knowledge Cranial Nerves:    The pupils are equal, round, and reactive to light. Attempted fundoscopic exam could not visualize.  Visual fields are full to finger confrontation. Extraocular  movements are intact. Trigeminal sensation is intact and the muscles of mastication are normal. The face is symmetric. The palate elevates in the midline. Hearing intact. Voice is normal. Shoulder shrug is normal. The tongue has normal motion without fasciculations.   Coordination:    Normal finger to nose and heel to shin. Normal rapid alternating movements.   Gait:    No ataxia  Motor Observation:    No asymmetry, no atrophy, and no involuntary movements noted. Tone:  Normal muscle tone.    Posture:    Posture is normal. normal erect    Strength:    Strength is V/V in the upper and lower limbs.      Sensation: intact to LT     Reflex Exam:  DTR's:    Deep tendon reflexes in the upper and lower extremities are symmetrical bilaterally.   Toes:    The toes are equivocal bilaterally.   Clonus:    Clonus is absent.      Assessment/Plan:  Stroke:  left thalamic infarct embolic secondary to small vessel disease     Resultant  Resoved  MRI  L thalamic infarct  MRA  Mild irreg and narrowing PICA and SCA, R PCA. B AICA not seen. Ectatic R VA and BA without narrowing  Carotid Doppler  No significant stenosis   2D Echo  pending  LDL 117  HgbA1c normal  Aspirin 81mg  daily. Continue Atorvastatin, stopped smoking  F/u with primary care within one month   I had a long d/w patient about her recent stroke, risk for recurrent stroke/TIAs, personally independently reviewed imaging studies and stroke evaluation results and answered questions.Continue ASA for secondary stroke prevention and maintain strict control of hypertension with blood pressure goal below 130/90, diabetes with hemoglobin A1c goal below 6.5% and lipids with LDL cholesterol goal below 70 mg/dL.I also advised the patient to eat a healthy diet with plenty of whole grains, cereals, fruits and vegetables, exercise regularly and maintain ideal body weight .Followup in the future as needed.  Naomie Dean,  MD  [facility] [address] [place], [place] [address]  Phone [phone] Fax [phone]

## 2016-01-02 NOTE — Patient Instructions (Addendum)
Remember to drink plenty of fluid, eat healthy meals and do not skip any meals. Try to eat protein with a every meal and eat a healthy snack such as fruit or nuts in between meals. Try to keep a regular sleep-wake schedule and try to exercise daily, particularly in the form of walking, 20-30 minutes a day, if you can.   As far as your medications are concerned, I would like to suggest: Baby aspirin daily,  Continue cholesterol medication (atorvastatin)  I would like to see you back as needed, sooner if we need to. Please call us with any interim questions, concerns, problems, updates or refill requests.   Our phone number is [phone]. We also have an after hours call service for urgent matters and there is a physician on-call for urgent questions. For any emergencies you know to call 911 or go to the nearest emergency room

## 2016-01-04 ENCOUNTER — Telehealth: Payer: Self-pay

## 2016-01-04 NOTE — Telephone Encounter (Signed)
[employer] [place] [phone] contacted pt to go over lab results spoke with patient son in english and made him aware of results and doesn't have any questions or concerns

## 2016-01-15 ENCOUNTER — Other Ambulatory Visit: Payer: Self-pay | Admitting: Internal Medicine

## 2016-01-15 ENCOUNTER — Ambulatory Visit: Payer: Self-pay | Attending: Internal Medicine | Admitting: Internal Medicine

## 2016-01-15 ENCOUNTER — Encounter: Payer: Self-pay | Admitting: Internal Medicine

## 2016-01-15 VITALS — BP 153/96 | HR 89 | Temp 98.0°F | Resp 16 | Wt 179.2 lb

## 2016-01-15 DIAGNOSIS — Z1239 Encounter for other screening for malignant neoplasm of breast: Secondary | ICD-10-CM

## 2016-01-15 DIAGNOSIS — F4323 Adjustment disorder with mixed anxiety and depressed mood: Secondary | ICD-10-CM | POA: Insufficient documentation

## 2016-01-15 DIAGNOSIS — G43909 Migraine, unspecified, not intractable, without status migrainosus: Secondary | ICD-10-CM | POA: Insufficient documentation

## 2016-01-15 DIAGNOSIS — J449 Chronic obstructive pulmonary disease, unspecified: Secondary | ICD-10-CM | POA: Insufficient documentation

## 2016-01-15 DIAGNOSIS — Z720 Tobacco use: Secondary | ICD-10-CM

## 2016-01-15 DIAGNOSIS — Z780 Asymptomatic menopausal state: Secondary | ICD-10-CM | POA: Insufficient documentation

## 2016-01-15 DIAGNOSIS — Z8673 Personal history of transient ischemic attack (TIA), and cerebral infarction without residual deficits: Secondary | ICD-10-CM | POA: Insufficient documentation

## 2016-01-15 DIAGNOSIS — Z1231 Encounter for screening mammogram for malignant neoplasm of breast: Secondary | ICD-10-CM

## 2016-01-15 DIAGNOSIS — Z6830 Body mass index (BMI) 30.0-30.9, adult: Secondary | ICD-10-CM | POA: Insufficient documentation

## 2016-01-15 DIAGNOSIS — Z7982 Long term (current) use of aspirin: Secondary | ICD-10-CM | POA: Insufficient documentation

## 2016-01-15 DIAGNOSIS — Z124 Encounter for screening for malignant neoplasm of cervix: Secondary | ICD-10-CM | POA: Insufficient documentation

## 2016-01-15 MED ORDER — BUPROPION HCL ER (SR) 150 MG PO TB12: 150.0000 mg | ORAL_TABLET | Freq: Two times a day (BID) | ORAL | 3 refills | Status: DC

## 2016-01-15 MED FILL — BUPROPION SR 150 MG TABLET: 150 | 30 days supply | Qty: 60 | Fill #0

## 2016-01-15 NOTE — Progress Notes (Signed)
[name] [name], is a 65 y.o. female  ZOX:[id number]  [id number]  DOB - 10/15/1950  Chief Complaint  Patient presents with  . Gynecologic Exam        Subjective:   [name] RiserCarmen Gamm is a 65 y.o. female here today for a follow up visit papsmear.  Per pt, denies any sexual intercourse, abnml breast /vagial discharge.  Postmenopausal, denies abnml bleeding.  [place]  She states her left arm pain is much improved w/ Neurontin, and able to use it more. She is anxious and stressed due to unemployed status, and she is trying to apply for jobs at this time.  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 with mild residual double vision, she was started on aspirin 325 and statin, Lipitor 20 mg daily, at the time.   Asked for jury duty letter, since she missed it due to medical reasons.  Patient has No headache, No chest pain, No abdominal pain - No Nausea, No new weakness tingling or numbness, No Cough - SOB.  No problems updated.  ALLERGIES: No Known Allergies  PAST MEDICAL HISTORY: Past Medical History:  Diagnosis Date  . Anxiety   . COPD (chronic obstructive pulmonary disease) (HCC)   . Diverticulitis   . Migraines     MEDICATIONS AT HOME: Prior to Admission medications   Medication Sig Start Date End Date Taking? Authorizing Provider  acetaminophen (TYLENOL) 500 MG tablet Take 1,000 mg by mouth every 6 (six) hours as needed for moderate pain.    Historical Provider, MD  albuterol (PROVENTIL HFA;VENTOLIN HFA) 108 (90 Base) MCG/ACT inhaler Inhale 1-2 puffs into the lungs every 6 (six) hours as needed for wheezing or shortness of breath. 01/01/16   Pete Glatterawn T Vaishali Baise, MD  albuterol (PROVENTIL) (2.5 MG/3ML) 0.083%  nebulizer solution Take 3 mLs (2.5 mg total) by nebulization every 4 (four) hours as needed for wheezing or shortness of breath. 01/01/16   Pete Glatterawn T Loyalty Arentz, MD  aspirin 325 MG tablet Take 325 mg by mouth daily.    Historical Provider, MD  atorvastatin (LIPITOR) 20 MG tablet Take 1 tablet (20 mg total) by mouth daily at 6 PM. 01/01/16   Pete Glatterawn T Camden Knotek, MD  budesonide-formoterol [facility](SYMBICORT) 160-4.5 MCG/ACT inhaler Take 2 puffs first thing in am and then another 2 puffs about 12 hours later. 01/01/16   Pete Glatterawn T Smith Mcnicholas, MD  buPROPion (WELLBUTRIN SR) 150 MG 12 hr tablet Take 1 tablet (150 mg total) by mouth 2 (two) times daily. 01/15/16   Pete Glatterawn T Catherine Oak, MD  diclofenac sodium (VOLTAREN) 1 % GEL Apply 2 g topically 4 (four) times daily. 01/01/16   Pete Glatterawn T Seraiah Nowack, MD  gabapentin (NEURONTIN) 300 MG capsule Take 1 capsule (300 mg total) by mouth at bedtime. 01/01/16   Pete Glatterawn T Kawanna Christley, MD  hydrOXYzine (ATARAX/VISTARIL) 10 MG tablet Take 1 tablet (10 mg total) by mouth 3 (three) times daily as needed. Patient not taking: Reported on 01/02/2016 11/08/15   Wanda PlumpJose E Paz, MD  ibuprofen (ADVIL,MOTRIN) 200 MG tablet Take 400 mg by mouth every 6 (six) hours as needed for headache or moderate pain.    Historical Provider, MD     Objective:   Vitals:   01/15/16 1049  BP: Marland Kitchen(!)  153/96  Pulse: 89  Resp: 16  Temp: 98 F (36.7 C)  TempSrc: Oral  SpO2: 96%  Weight: 179 lb 3.2 oz (81.3 kg)    Exam General appearance : Awake, alert, not in any distress. Speech Clear. Not toxic looking. Tearful, anxious about unemployed status, anxious about pap smear. HEENT: Atraumatic and Normocephalic, pupils equally reactive to light. Neck: supple, no JVD.  Breast /axilla: bilat nml appearance, not dippling noted. No palpable masses/nodules/nipple discharge noted on exam  Chest:Good air entry bilaterally, no added sounds. CVS: S1 S2 regular, no murmurs/gallups or rubs. Abdomen: Bowel sounds active, Non tender and not  distended with no gaurding, rigidity or rebound. Pelvic Exam: Cervix normal in appearance, cerv os atresia noted, external genitalia normal, no adnexal masses or tenderness, no cervical motion tenderness, rectovaginal septum normal, uterus normal size, shape, and consistency and vagina normal with thick white discharge, no odor, pt denies puritis.   Extremities: B/L Lower Ext shows no edema, both legs are warm to touch Neurology: Awake alert, and oriented X 3, CN II-XII grossly intact, Non focal Skin:No Rash  Data Review Lab Results  Component Value Date   HGBA1C 5.6 12/18/2015    [facility] 01/15/2016 01/01/2016 12/25/2015 03/12/2015  Decreased Interest 0 2 0 0  Down, Depressed, Hopeless 1 3 0 0  PHQ - 2 Score 1 5 0 0  Altered sleeping 0 1 - -  Tired, decreased energy (No Data) 1 - -  Change in appetite 0 2 - -  Feeling bad or failure about yourself  (No Data) 0 - -  Trouble concentrating 0 0 - -  Moving slowly or fidgety/restless 0 1 - -  Suicidal thoughts 0 0 - -  PHQ-9 Score 1 10 - -      Assessment & Plan   1. Pap smear for cervical cancer screening - Cytology - PAP - PAP, Thin Prep w/HPV rflx HPV Type 16/18 ([facility])  2. Breast cancer screening - [facility] Digital Screening; Future  3. Adjustment disorder with mixed anxiety and depressed mood - trial welbutrin 150 bid, will help w/ anxiety/depression, but also w/ tob abuse and some weight loss for obesity - recd pt talks to our SW, but she would prefer not to at this time. - denied si/hi/avh  4. Tobacco abuse Trial welbutrin bid  5. Morbid obesity  6. 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  7. Jury duty letter provided.   Patient have been counseled extensively about nutrition and exercise  Return in about 3 months (around 04/14/2016).  The patient was  given clear instructions to go to ER or return to medical center if symptoms don't improve, worsen or new problems develop. The patient verbalized understanding. The patient was told to call to get lab results if they haven't heard anything in the next week.   This note has been created with Education officer, environmentalDragon speech recognition software and smart phrase technology. Any transcriptional errors are unintentional.   Pete Glatterawn T Ether Goebel, MD, MBA/MHA [facility] [place] [phone]   01/15/2016, 11:13 AM

## 2016-01-15 NOTE — Patient Instructions (Addendum)
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 Interfiere en las funciones vitales, incluyendo las [place], el trabajo y la escuela.  Es diferente de la ansiedad normal que todas las personas experimentan en algn momento de su vida en respuesta a sucesos y Chief Executive Officer. En verdad, la ansiedad normal nos ayuda a prepararnos y Brewing technologist acontecimientos y actividades de la vida. La ansiedad normal desaparece despus de que el evento o la actividad ha finalizado.  El trastorno de ansiedad generalizada no est necesariamente relacionada con eventos o actividades especficas. Tambin causa un exceso de ansiedad en proporcin a sucesos o actividades especficas. En este trastorno la ansiedad es difcil de Chief Technology Officer. Los sntomas pueden variar de leves a muy graves. Las personas que sufren de trastorno de ansiedad generalizada pueden tener intensas olas de ansiedad con sntomas fsicos (ataques de pnico).  SNTOMAS  La ansiedad y la preocupacin asociada a este trastorno son difciles de Chief Technology Officer. Esta ansiedad y la preocupacin estn relacionados con muchos eventos de la vida y sus actividades y tambin ocurre durante ms [employer] que no ocurre, durante 6 meses o ms. Las personas que la sufren pueden tener tres o ms de los siguientes sntomas (uno o ms en los nios):   Customer service manager.  Dificultades de concentracin.   Irritabilidad.  Tensin muscular  Dificultad para dormirse o sueo poco satisfactorio. DIAGNSTICO  Se diagnostica a travs de una evaluacin realizada por el mdico. El mdico le har preguntas acerca de su estado de nimo, sntomas fsicos y sucesos de [place] vida. Le har preguntas sobre su historia clnica, el consumo de alcohol o drogas, incluyendo los medicamentos recetados. [employer] un examen fsico e indicar anlisis de [place]. Ciertas enfermedades y el uso de  determinadas sustancias pueden causar sntomas similares a este trastorno. Su mdico lo puede derivar a Teaching laboratory technician en salud mental para una evaluacin ms profunda.Belva Crome  Las terapias siguientes se utilizan en el tratamiento de este trastorno:   Medicamentos - Se recetan antidepresivos para el control diario a Barrister's clerk. Pueden indicarse tambin medicamentos para combatir la [place] graves, especialmente cuando ocurren ataques de pnico.   Terapia conversada (psicoterapia) Ciertos tipos de psicoterapia pueden ser tiles en el tratamiento del trastorno de ansiedad generalizada, proporcionando apoyo, educacin y Fish farm manager. Una forma de psicoterapia llamada terapia cognitivo-conductual puede ensearle formas saludables de pensar y Firefighter a los eventos y actividades de la vida diaria.  Tcnicasde manejo del estrs- Estas tcnicas incluyen el yoga, la meditacin y el ejercicio y pueden ser muy tiles cuando se practican con regularidad. Un especialista en salud mental puede ayudar a determinar qu tratamiento es mejor para usted. Algunas personas obtienen mejora con una terapia. Sin embargo, Producer, television/film/video requieren una combinacin de terapias.  Esta informacin no tiene Marine scientist el consejo del mdico. Asegrese de hacerle al mdico cualquier pregunta que tenga. Document Released: 05/17/2012 Document Revised: 02/10/2014 Elsevier Interactive Patient Education  2017 [place] para dejar de fumar (Steps to Quit Smoking) Fumar tabaco es malo para su salud. Puede afectar a casi cualquier rgano del cuerpo. Fumar lo pone a usted y a Public affairs consultant a su alrededor en riesgo de Sports administrator enfermedades graves de [place] plazo (crnicas). Dejar de fumar es difcil, pero es una de las mejores cosas que puede hacer por su salud. Nunca es muy tarde para dejar de fumar. CULES  [place]? Al dejar de fumar,  se reduce el riesgo de contraer enfermedades y afecciones graves. Estas pueden incluir los siguientes:  Enfermedad o cncer de pulmn.  Cardiopata coronaria.  Ictus.  Infarto de miocardio.  Imposibilidad de tener hijos (infertilidad).  Huesos dbiles (osteoporosis) y huesos rotos (fracturas). La tos, las sibilancias y la falta de aire son sntomas que mejoran cuando deja de fumar. Es posible tambin que se enferme con Media planner. Si est embarazada, dejar de fumar la ayudar a reducir las probabilidades de Best boy un beb de bajo peso al nacer. QU PUEDO HACER PARA [place]? Pregntele al [employer] cosas que pueden ayudarlo a dejar el hbito. Algunos cosas que puede hacer (estrategias) incluyen:  Dejar de fumar de forma definitiva en lugar de ir reduciendo gradualmente la cantidad de cigarrillos durante un perodo.  Recibir asesoramiento psicolgico individual. Es ms probable que tenga xito si asiste a diversas sesiones de Merchant navy officer.  Usar recursos y sistemas de soporte, como, por ejemplo:  Charlas en lnea con un consejero.  Lneas telefnicas para dejar de fumar.  Materiales impresos de [place].  Grupos de apoyo o asesoramiento psicolgico grupal.  Programas de mensajes de texto.  Aplicaciones para telfonos celulares.  Tomar medicamentos. Algunos de estos medicamentos pueden contener nicotina. Si est embarazada o amamantando, no tome ningn medicamento para dejar de fumar, excepto que el mdico lo autorice. Hable con el mdico sobre el asesoramiento psicolgico o sobre otras cosas que [place]. Hable con el mdico sobre usar ms de una estrategia al [employer], [place], por ejemplo, tomar medicamentos y tambin recibir asesoramiento psicolgico. Esto puede facilitarle el proceso para dejar de fumar. QU PUEDO HACER PARA QUE DEJAR DE FUMAR SEA MS FCIL? Al principio, dejar de fumar puede parecer abrumador, pero hay  muchas opciones que facilitan el [place]. Tome estas medidas:  Converse con su familia o sus amigos. Busque su apoyo y [place].  Llame a las lneas telefnicas que ayudan a dejar de fumar, pngase en contacto con grupos de apoyo o reciba asesoramiento de un consejero.  Pdale a la gente que fuma que no lo haga a su alrededor.  Evite los lugares que pueden despertar el deseo de fumar (disparadores), como, por ejemplo:  Bares.  Fiestas.  reas para fumar en el trabajo.  Pase tiempo con personas que no fuman.  Disminuya todo tipo de estrs de la vida diaria. El estrs puede hacer que usted desee fumar. Pruebe estas cosas para disminuir el estrs:  Psychologist, prison and probation services actividad fsica con regularidad.  Practicar ejercicios de respiracin profunda.  Practicar yoga.  Medite.  Realizar una visualizacin corporal. Para ello, cierre los ojos, concntrese en una zona del cuerpo a la vez desde la cabeza [employer] dedos de los pies y fjese qu partes del cuerpo estn tensas. Relaje los msculos de esas reas.  Descargue o compre aplicaciones para telfonos mviles o tabletas que le ayuden a respetar el plan para dejar de fumar. Hay muchas aplicaciones gratuitas, como QuitGuide de los Centros para el Control y la Prevencin de Arboriculturist (CDC, Doctor, hospital for [employer] and Prevention). Puede hallar otros recursos de Cardinal Health.gov y en otros sitios web. Esta informacin no tiene Marine scientist el consejo del mdico. Asegrese de hacerle al mdico cualquier pregunta que tenga. Document Released: 02/22/2010 Document Revised: 04/14/2011 Document Reviewed: 06/06/2014 Elsevier Interactive Patient Education  2017 [place]  salud de las mujeres posmenopusicas (Health Maintenance for Postmenopausal Women) La menopausia es un proceso normal en el cual se pierde la capacidad reproductiva. Este proceso ocurre gradualmente a lo largo de un perodo de meses o aos,  por lo general entre los 22 y los 55aos. La menopausia es completa cuando no se han tenido 58mnstruaciones consecutivas. Es importante hablar con el mdico sobre algunas de las enfermedades ms comunes que afectan a las mujeres posmenopusicas, como la cardiopata coronaria, el cncer y la prdida de la masa sea (osteoporosis). Adoptar un estilo de vida saludable y recibir atencin preventiva pueden ayudar a promover la salud y eMusician Adems, estas medidas pueden reducir las probabilidades de desarrollar algunas de estas enfermedades frecuentes. QU DEBO SABER ACERCA DE [place]ates  Durante le m[place] puede tener una serie de sntomas, por ejemplo:  Calores repentinos moderados a graves.  Sudoracin nocturna.  Disminucin del deseo sexual.  Cambios en el estado de nimo.  Dolores de [place]  Cansancio.  Irritabilidad.  Problemas de memoria.  Insomnio. Tratar o no los cambios que ocurren en la menopausia es una decisin personal que se toma con el mdico. QU DEBO SABER SOBRE LOS TRATAMIENTOS DE REPOSICIN HORMONAL Y LOS SUPLEMENTOS? Los productos para la terapia hormonal son eficaces para tratar los sntomas que se asocian con la menopausia, como los calores repentinos y las sudoraciones nocturnas. La reposicin hormonal conlleva ciertos riesgos, especialmente a medida que una mujer envejece. Si est pensando en usar tratamientos con estrgeno o estrgeno con progesterona, analice los beneficios y los riesgos con el mdico. QU DEBO SABER [place] A medida que una persona envejece, aumenta la probabilidad de tener cardiopata coronaria, infarto de miocardio e ictus. Esto puede deberse, en parte, a los cambios hormonales que atraviesa el cuerpo durante la menopausia. Estos cambios pueden afectar la forma en que el organismo procesa las g[place] los triglicridos y el colesterol de su dieta. El infarto de miocardio y el ictus son  emergencias mdicas. Hay muchas cosas que se pueden hacer para ayudar a prevenir la cardiopata coronaria y el ictus:  Debe controlar su presin arterial al menos cada uno o dHarold La hipertensin arterial causa enfermedades cardacas y aSerbiael riesgo de ictus.  Si tiene entre 538y 79aos, consulte al mdico si debe tomar aspirina para prevenir un infarto de miocardio o un ictus.  No consuma ningn producto que contenga tabaco, lo que incluye cigarrillos, tabaco de mHigher education careers advisero cPsychologist, sport and exercise Si necesita ayuda para dejar de fumar, consulte al mMeadWestvaco  Es importante seguir una dieta sana y mTheatre managerun peso saludable.  Asegrese de iFamily Dollar Storesverduras, frutas, productos lcteos de bajo contenido de gDjiboutiy pAdvertising account planner  No consuma alimentos con alto contenido de grasas slidas, azcares agregados o sal (sodio).  Realice actividad fsica con regularidad. Esta es una de las cosas ms importantes que puede hacer por su salud.  Intente realizar al menos 1568mutos de actividad fsica por semana. El tipo de ejercicio que realice debe aumentar la frecuencia cardaca y hacerla sudar. Esto se conoce como ejercicio de inMalta Intente hacer ejercicios de elongacin por lo menos dos veces por semana. Agrguelos al plan de ejercicio de intensidad moderada.  Conozca sus cifras. Pdale al mdico que le controle el colesterol y el nivel sanguneo de glucosa. Siga hacindose anlisis de saAmerican Electric Powere lo haya indicado el mdico. QU DEBO SABER SOBRE LAS PRUEBAS DE DETECCIN DEL CNCER? Hay varios  tipos de cncer. Tome las siguientes medidas para reducir el riesgo y Actuary formacin cancerosa lo antes posible. Cncer de mama   Practique la autoconciencia de la mama.  Esto significa reconocer la apariencia normal de sus mamas y cmo las siente.  Tambin significa realizar autoexmenes regulares de [employer]. Informe a su mdico sobre cualquier cambio, sin  importar cun pequeo sea.  Si es mayor de 40aos, visite a un mdico para Public librarian un examen de las mamas (exploracin clnica mamaria o ECM) todos los [place]. En funcin de [employer], los antecedentes familiares y la historia [place], tal vez sea recomendable que tambin se haga una radiografa anual de las mamas [place]).  Si tiene antecedentes familiares de cncer de mama, hable con el mdico para someterse a un estudio gentico.  Si tiene alto riesgo de Chief Financial Officer de mama, hable con el mdico para hacerse a Public house manager (RM) y Lavinia Sharps todos los [place].  La evaluacin del gen del cncer de mama (BRCA) se recomienda a las mujeres que tengan familiares con tumores malignos relacionados con el BRCA. Los resultados de la evaluacin determinarn la necesidad de recibir asesoramiento gentico y Building services engineer de deteccin del BRCA1 y el BRCA2. Los tumores malignos relacionados con el BRCA incluyen estos tipos:  [place]. Este tipo se presenta en hombres o mujeres.  Ovario.  Trompas. A este tipo tambin se lo llama cncer de trompa de [place].  Cncer de la pared abdominal o plvica (cncer de peritoneo).  Prstata.  Pncreas. Cncer de cuello uterino, de tero y de ovario  El mdico puede recomendarle que se haga pruebas peridicas de deteccin de cncer de los rganos de la pelvis, los cuales [employer] ovarios, el tero y la vagina. Estas pruebas incluyen un examen plvico, que abarca controlar si se produjeron cambios microscpicos en la superficie del cuello del tero (prueba de Papanicolaou).  A las mujeres que [employer] 21 y 54aos, los mdicos pueden recomendarles que se realicen un examen plvico y Ardelia Mems prueba de Papanicolaou cada tres aos. A las mujeres que tienen entre 30 y 65aos, pueden recomendarles la prueba de Papanicolaou y el examen plvico, en combinacin con una prueba de deteccin del virus del papiloma humano (VPH) [place]. Algunos tipos de VPH  aumentan el riesgo de Chief Financial Officer de cuello del tero. La prueba para la deteccin del VPH tambin puede realizarse a mujeres de cualquier edad cuyos resultados de la prueba de Papanicolaou no sean claros.  Es posible que otros mdicos no recomienden exmenes de deteccin a las mujeres no embarazadas que se consideran sujetos de bajo riesgo de Chief Financial Officer de pelvis y no tienen sntomas. Pregntele al mdico si un examen plvico de deteccin es adecuado para usted.  Si ha recibido un tratamiento para Science writer cervical o una enfermedad que podra causar cncer, necesitar realizarse una prueba de Papanicolaou y controles durante al menos 48 aos de concluido el [place]. Si no se ha hecho el Papanicolaou con regularidad, debern volver a evaluarse los factores de riesgo (como tener un nuevo compaero sexual), para Teacher, adult education si debe empezar a Dispensing optician los estudios nuevamente. Algunas mujeres sufren problemas mdicos que aumentan la probabilidad de Museum/gallery curator cncer de cuello del tero. En estos casos, el mdico podr [employer] se realicen controles y pruebas de Papanicolaou con ms frecuencia.  Si tiene antecedentes familiares de cncer de tero o de ovario, hable con el mdico para someterse a un estudio gentico.  Si tiene  hemorragia vaginal despus de la menopausia, informe al mdico.  En la actualidad, no hay pruebas confiables para la deteccin del cncer de ovario. Cncer de pulmn  Se recomienda realizar exmenes de deteccin de cncer de pulmn a personas adultas entre 10 y 72 aos que estn en riesgo de Horticulturist, commercial de pulmn por sus antecedentes de consumo de tabaco. Se recomienda una tomografa computarizada (TC) de baja dosis de los pulmones todos los aos si usted:  Fuma actualmente.  Ha fumado durante 30aos un paquete diario y sigue fumando o dej el hbito en algn momento en los ltimos 15aos. Un paquete-ao equivale a fumar en promedio un paquete de cigarrillos  diario durante un ao. Los exmenes de deteccin anuales:  Deben hacerse hasta que hayan pasado 15aos desde que dej de fumar.  Deben dejar de realizarse si tiene un problema de salud que le impide recibir tratamiento para el cncer de pulmn. Cncer colorrectal   Este tipo de cncer puede detectarse y a menudo prevenirse.  El estudio de [place] de Programme researcher, broadcasting/film/video del cncer colorrectal debe comenzar a Electrical engineer a Proofreader de los 70aos y [place].  El mdico puede aconsejarle que lo haga antes, si tiene factores de riesgo de Best boy cncer de colon.  Si tiene antecedentes familiares de cncer colorrectal, hable con el mdico para someterse a un estudio gentico.  El mdico tambin puede recomendarle que use un kit de prueba para Engineer, mining a fin de Educational psychologist en la materia fecal.  Es posible que se use una pequea cmara en el extremo de un tubo para examinar directamente el colon (sigmoidoscopia o colonoscopia) a fin de Hydrographic surveyor formas tempranas de cncer colorrectal.  El examen directo del colon se debe repetir cada 5 a 10aos hasta los 12aos. Sin embargo, si se hallan formas incipientes de plipos precancerosos o pequeos tumores, o si tiene antecedentes familiares o riesgo gentico de Therapist, music, debe realizarse exmenes de deteccin con ms frecuencia. Cncer de piel   Revise la piel de la cabeza a los pies con regularidad.  Contrlese los lunares. Infrmele al mdico:  Si aparecen nuevos lunares o los que tiene se modifican, especialmente en su forma o color.  Si tiene un lunar que es ms grande que el tamao de una goma de Games developer.  Si alguno de los miembros de su familia tiene antecedentes de cncer de piel, especialmente a una edad temprana, hable con el mdico para someterse a pruebas genticas.  Siempre use pantalla solar. Aplique pantalla solar de Kerry Dory y repetida a lo largo del Training and development officer.  Protjase usando mangas y [employer], un  sombrero de ala ancha y gafas para el sol, siempre que est al [place]. QU DEBO SABER SOBRE LA OSTEOPOROSIS? La osteoporosis es una afeccin en la cual la destruccin de la masa sea ocurre con mayor rapidez que su formacin. Despus de la menopausia, puede correr un riesgo ms alto de tener osteoporosis. Para ayudar a prevenir esta afeccin o las fracturas seas que pueden ocurrir a causa de [place], se recomienda lo siguiente:  Si tiene entre 19 y 50aos, tome como mnimo 1056m de calcio y 6054mde vitaminaD por daTraining and development officer Si es mayor de 50aos pero menor de 70aos, tome como mnimo 120078me calcio y 600m27m vitaminaD por da. Training and development officeri es mayor de 70aos, tome como mnimo 1200mg69mcalcio y 800mg 75mitaminaD por da. ElTraining and development officerabaquismo y el consumo excesivo de alcohol aumentan el riesgo de  osteoporosis. Consuma alimentos ricos en calcio y vitaminaD, y haga ejercicios con soporte de peso varias veces a la semana, como se lo haya indicado el mdico. QU DEBO SABER SOBRE EL MODO EN QUE LA MENOPAUSIA AFECTA MI SALUD MENTAL? La depresin puede presentarse a cualquier edad, pero es ms frecuente a medida que una persona envejece. Los sntomas comunes de depresin incluyen lo siguiente:  Desnimo o tristeza.  Cambios en los patrones de sueo.  Cambios en el apetito o en los hbitos de alimentacin.  Sensacin de falta general de motivacin o placer al [employer] actividades que sola disfrutar.  Crisis frecuentes de llanto. Hable con el mdico si cree que tiene depresin. QU DEBO SABER SOBRE LAS VACUNAS? Es importante que se aplique las vacunas y [place]. Estas incluyen las siguientes:  Vacuna contra el ttanos, la difteria y la tosferina (Tdap).  Vacuna anual contra la gripe antes del inicio de la temporada de gripe.  Vacuna contra la neumona.  Vacuna contra el herpes. El mdico tambin puede recomendarle que se aplique otras vacunas. Esta informacin no tiene  Marine scientist el consejo del mdico. Asegrese de hacerle al mdico cualquier pregunta que tenga. Document Released: 11/10/2012 Document Revised: 02/10/2014 Document Reviewed: 10/24/2014 Elsevier Interactive Patient Education  2017 [employer].

## 2016-01-16 LAB — CERVICOVAGINAL ANCILLARY ONLY: WET PREP (BD AFFIRM): POSITIVE — AB

## 2016-01-17 ENCOUNTER — Ambulatory Visit: Payer: Self-pay | Attending: Internal Medicine

## 2016-01-17 LAB — CYTOLOGY - PAP
DIAGNOSIS: NEGATIVE
HPV (WINDOPATH): NOT DETECTED

## 2016-01-18 LAB — HERPES SIMPLEX VIRUS(HSV) DNA BY PCR
HSV 1 DNA: NOT DETECTED
HSV 2 DNA: NOT DETECTED

## 2016-01-18 LAB — CERVICOVAGINAL ANCILLARY ONLY: Herpes: NEGATIVE

## 2016-01-19 ENCOUNTER — Other Ambulatory Visit: Payer: Self-pay | Admitting: Internal Medicine

## 2016-01-19 MED ORDER — METRONIDAZOLE 500 MG PO TABS: 500.0000 mg | ORAL_TABLET | Freq: Two times a day (BID) | ORAL | 0 refills | Status: DC

## 2016-01-25 ENCOUNTER — Telehealth: Payer: Self-pay

## 2016-01-25 NOTE — Telephone Encounter (Signed)
[employer] Jesus Id: [id number] contacted patient to go over lab results pt didn't answer was unable to lvm will try again another day

## 2016-02-11 MED FILL — ALBUTEROL 0.083% INHAL SOLN: (2.5 MG/3ML | 20 days supply | Qty: 90 | Fill #1

## 2016-02-11 MED FILL — ATORVASTATIN 20 MG TABLET: 20 | 30 days supply | Qty: 30 | Fill #1

## 2016-02-13 ENCOUNTER — Telehealth: Payer: Self-pay | Admitting: Internal Medicine

## 2016-02-13 NOTE — Telephone Encounter (Signed)
Patient came in and is needing lab results. Please follow up.

## 2016-02-18 ENCOUNTER — Ambulatory Visit
Admission: RE | Admit: 2016-02-18 | Discharge: 2016-02-18 | Disposition: A | Discharge status: Home / Self Care | Payer: No Typology Code available for payment source | Source: Ambulatory Visit | Attending: Internal Medicine | Admitting: Internal Medicine

## 2016-02-18 DIAGNOSIS — Z1239 Encounter for other screening for malignant neoplasm of breast: Secondary | ICD-10-CM

## 2016-02-27 ENCOUNTER — Telehealth: Payer: Self-pay

## 2016-02-27 NOTE — Telephone Encounter (Signed)
[employer] [facility] [place] Id: [id number] Contacted pt to go over mm results pt is aware of results and doesn't have any questions or concerns

## 2016-02-29 MED FILL — BUPROPION SR 150 MG TABLET: 150 | 30 days supply | Qty: 60 | Fill #1

## 2016-03-12 MED FILL — ATORVASTATIN 20 MG TABLET: 20 | 30 days supply | Qty: 30 | Fill #2

## 2016-03-18 ENCOUNTER — Encounter: Payer: Self-pay | Admitting: Physician Assistant

## 2016-03-18 ENCOUNTER — Ambulatory Visit: Payer: Self-pay | Attending: Physician Assistant | Admitting: Physician Assistant

## 2016-03-18 VITALS — BP 121/86 | HR 79 | Temp 98.1°F | Resp 20

## 2016-03-18 DIAGNOSIS — J029 Acute pharyngitis, unspecified: Secondary | ICD-10-CM | POA: Insufficient documentation

## 2016-03-18 LAB — POCT RAPID STREP A (OFFICE): Rapid Strep A Screen: NEGATIVE

## 2016-03-18 MED ORDER — PREDNISONE 20 MG PO TABS: 20.0000 mg | ORAL_TABLET | Freq: Every day | ORAL | 0 refills | Status: DC

## 2016-03-18 MED ORDER — AMOXICILLIN-POT CLAVULANATE 875-125 MG PO TABS: 1.0000 | ORAL_TABLET | Freq: Two times a day (BID) | ORAL | 0 refills | Status: AC

## 2016-03-18 MED ORDER — ACETAMINOPHEN 500 MG PO TABS: 500.0000 mg | ORAL_TABLET | Freq: Four times a day (QID) | ORAL | 0 refills | Status: DC | PRN

## 2016-03-18 MED FILL — AMOX-CLAV 875-125 MG TABLET: 875-125 | 10 days supply | Qty: 20 | Fill #0

## 2016-03-18 MED FILL — predniSONE 20 MG TABS: 20 | 5 days supply | Qty: 5 | Fill #0

## 2016-03-18 NOTE — Patient Instructions (Signed)
Faringitis (Pharyngitis) La faringitis ocurre cuando la faringe presenta enrojecimiento, dolor e hinchazn (inflamacin). CAUSAS Normalmente, la faringitis se debe a una infeccin. Generalmente, estas infecciones ocurren debido a virus (viral) y se presentan cuando las personas se resfran. Sin embargo, a veces la faringitis es provocada por bacterias (bacteriana). Las alergias tambin pueden ser una causa de la faringitis. La faringitis viral se puede contagiar de una persona a otra al toser, estornudar y compartir objetos o utensilios personales (tazas, tenedores, cucharas, cepillos de diente). La faringitis bacteriana se puede contagiar de una persona a otra a travs de un contacto ms ntimo, como besar. SIGNOS Y SNTOMAS Los sntomas de la faringitis incluyen los siguientes:  Dolor de garganta.  Cansancio (fatiga).  Fiebre no muy elevada.  Dolor de cabeza.  Dolores musculares y en las articulaciones.  Erupciones cutneas  Ganglios linfticos hinchados.  Una pelcula parecida a las placas en la garganta o las amgdalas (frecuente con la faringitis bacteriana). DIAGNSTICO El mdico le har preguntas sobre la enfermedad y sus sntomas. Normalmente, todo lo que se necesita para diagnosticar una faringitis son sus antecedentes mdicos y un examen fsico. A veces se realiza una prueba rpida para estreptococos. Tambin es posible que se realicen otros anlisis de laboratorio, segn la posible causa. TRATAMIENTO La faringitis viral normalmente mejorar en un plazo de 3 a 4das sin medicamentos. La faringitis bacteriana se trata con medicamentos que matan los grmenes (antibiticos). INSTRUCCIONES PARA EL CUIDADO EN EL HOGAR  Beba gran cantidad de lquido para mantener la orina de tono claro o color amarillo plido.  Tome solo medicamentos de venta libre o recetados, segn las indicaciones del mdico. ? Si le receta antibiticos, asegrese de terminarlos, incluso si comienza a sentirse  mejor. ? No tome aspirina.  Descanse lo suficiente.  Hgase grgaras con 8onzas (227ml) de agua con sal (cucharadita de sal por litro de agua) cada 1 o 2horas para calmar la garganta.  Puede usar pastillas (si no corre riesgo de ahogarse) o aerosoles para calmar la garganta.  SOLICITE ATENCIN MDICA SI:  Tiene bultos grandes y dolorosos en el cuello.  Tiene una erupcin cutnea.  Cuando tose elimina una expectoracin verde, amarillo amarronado o con sangre.  SOLICITE ATENCIN MDICA DE INMEDIATO SI:  El cuello se pone rgido.  Comienza a babear o no puede tragar lquidos.  Vomita o no puede retener los medicamentos ni los lquidos.  Siente un dolor intenso que no se alivia con los medicamentos recomendados.  Tiene dificultades para respirar (y no debido a la nariz tapada).  ASEGRESE DE QUE:  Comprende estas instrucciones.  Controlar su afeccin.  Recibir ayuda de inmediato si no mejora o si empeora.  Esta informacin no tiene como fin reemplazar el consejo del mdico. Asegrese de hacerle al mdico cualquier pregunta que tenga. Document Released: 10/30/2004 Document Revised: 11/10/2012 Document Reviewed: 09/27/2012 Elsevier Interactive Patient Education  2017 Elsevier Inc.  

## 2016-03-18 NOTE — Progress Notes (Signed)
Subjective:  Patient ID: [name] [name], female    DOB: 03/31/1950  Age: 66 y.o. MRN: [id number]  CC: sore throat  HPI [name] [name] is a 66 y.o. female with a PMH of Anxiety and COPD that presents with a one-week history of sore throat, fever, and nasal congestion. Pain is described as moderately severe. Has noticed her pharynx has a deeply erythematous color. Has used salt water gargles and Listerine without any relief. Has associated maxillary sinus pressure/pain. No close contacts with the same. Denies chest pain, shortness of breath, headache, abdominal pain, rash, nausea, vomiting, swelling, or GI/GU symptoms.   Outpatient Medications Prior to Visit  Medication Sig Dispense Refill  . albuterol (PROVENTIL HFA;VENTOLIN HFA) 108 (90 Base) MCG/ACT inhaler Inhale 1-2 puffs into the lungs every 6 (six) hours as needed for wheezing or shortness of breath. 1 Inhaler 11  . albuterol (PROVENTIL) (2.5 MG/3ML) 0.083% nebulizer solution Take 3 mLs (2.5 mg total) by nebulization every 4 (four) hours as needed for wheezing or shortness of breath. 75 mL 12  . aspirin 325 MG tablet Take 325 mg by mouth daily.    Marland Kitchen. atorvastatin (LIPITOR) 20 MG tablet Take 1 tablet (20 mg total) by mouth daily at 6 PM. 90 tablet 3  . budesonide-formoterol (SYMBICORT) 160-4.5 MCG/ACT inhaler Take 2 puffs first thing in am and then another 2 puffs about 12 hours later. 3 Inhaler 3  . buPROPion (WELLBUTRIN SR) 150 MG 12 hr tablet Take 1 tablet (150 mg total) by mouth 2 (two) times daily. 120 tablet 3  . diclofenac sodium (VOLTAREN) 1 % GEL Apply 2 g topically 4 (four) times daily. 100 g 2  . ibuprofen (ADVIL,MOTRIN) 200 MG tablet Take 400 mg by mouth every 6 (six) hours as needed for headache or moderate pain.    Marland Kitchen. acetaminophen (TYLENOL) 500 MG tablet Take 1,000 mg by mouth every 6 (six) hours as needed for moderate pain.    Marland Kitchen. gabapentin (NEURONTIN) 300 MG capsule Take 1 capsule (300 mg total) by mouth at bedtime. (Patient  not taking: Reported on 03/18/2016) 90 capsule 2  . hydrOXYzine (ATARAX/VISTARIL) 10 MG tablet Take 1 tablet (10 mg total) by mouth 3 (three) times daily as needed. (Patient not taking: Reported on 01/02/2016) 40 tablet 1  . metroNIDAZOLE (FLAGYL) 500 MG tablet Take 1 tablet (500 mg total) by mouth 2 (two) times daily. (Patient not taking: Reported on 03/18/2016) 14 tablet 0   No facility-administered medications prior to visit.      ROS Review of Systems  Constitutional: Positive for fever. Negative for chills and malaise/fatigue.  HENT: Positive for sinus pain and sore throat. Negative for ear pain.   Eyes: Negative for pain.  Respiratory: Negative for cough and shortness of breath.   Cardiovascular: Negative for chest pain.  Gastrointestinal: Negative for abdominal pain.  Musculoskeletal: Negative for neck pain.  Skin: Negative for rash.  Neurological: Negative for headaches.    Objective:  BP 121/86 (Patient Position: Sitting, Cuff Size: Normal)   Pulse 79   Temp 98.1 F (36.7 C) (Oral)   Resp 20   SpO2 97%   BP/Weight 03/18/2016 01/15/2016 01/02/2016  Systolic BP 121 153 119  Diastolic BP 86 96 83  Wt. (Lbs) - 179.2 181.6  BMI - 30.76 31.17      Physical Exam  Constitutional: She is oriented to person, place, and time.  NAD, overweight, polite  HENT:  Head: Normocephalic and atraumatic.  TM normal bilaterally, turbinates  are moderately hypertrophic and erythematous, oropharynx deeply erythematous without exudates.  Eyes: No scleral icterus.  Neck: Normal range of motion. Neck supple. No thyromegaly present.  Cardiovascular: Normal rate, regular rhythm and normal heart sounds.   Pulmonary/Chest: Effort normal and breath sounds normal.  Musculoskeletal: She exhibits no edema.  Lymphadenopathy:    She has cervical adenopathy (Mild anterior cervical lymphadenopathy).  Neurological: She is alert and oriented to person, place, and time.  Skin: Skin is warm and dry. No  rash noted. No erythema. No pallor.  Psychiatric: She has a normal mood and affect. Her behavior is normal. Thought content normal.     Assessment & Plan:   1. Pharyngitis, unspecified etiology - POCT Rapid Strep A Negative in clinic - Culture, Group A Strep   Meds ordered this encounter  Medications  . amoxicillin-clavulanate (AUGMENTIN) 875-125 MG tablet    Sig: Take 1 tablet by mouth 2 (two) times daily.    Dispense:  20 tablet    Refill:  0    Order Specific Question:   Supervising Provider    Answer:   Quentin Angst [id number]  . predniSONE (DELTASONE) 20 MG tablet    Sig: Take 1 tablet (20 mg total) by mouth daily with breakfast.    Dispense:  5 tablet    Refill:  0    Order Specific Question:   Supervising Provider    Answer:   Quentin Angst [id number]  . acetaminophen (TYLENOL) 500 MG tablet    Sig: Take 1 tablet (500 mg total) by mouth every 6 (six) hours as needed.    Dispense:  30 tablet    Refill:  0    Order Specific Question:   Supervising Provider    Answer:   Quentin Angst [[id number]]    Follow-up: Return in about 4 weeks (around 04/14/2016) for f/u with Dr. Julien Nordmann.   Loletta Specter PA

## 2016-03-20 LAB — CULTURE, GROUP A STREP

## 2016-04-17 MED FILL — ?ATORVASTATIN 20 MG TABLET: 20 | 30 days supply | Qty: 30 | Fill #3

## 2016-05-16 MED FILL — ?METRONIDAZOLE 500 MG TABLE: 500 | 7 days supply | Qty: 14 | Fill #0

## 2016-05-16 MED FILL — ?ATORVASTATIN 20 MG TABLET: 20 | 30 days supply | Qty: 30 | Fill #4

## 2016-06-09 ENCOUNTER — Other Ambulatory Visit: Payer: Self-pay | Admitting: Internal Medicine

## 2016-06-10 ENCOUNTER — Other Ambulatory Visit: Payer: Self-pay | Admitting: Internal Medicine

## 2016-06-19 ENCOUNTER — Encounter: Payer: Self-pay | Admitting: Internal Medicine

## 2016-06-20 ENCOUNTER — Encounter: Payer: Self-pay | Admitting: Internal Medicine

## 2016-06-23 ENCOUNTER — Encounter: Payer: Self-pay | Admitting: Internal Medicine

## 2016-06-23 IMAGING — CT CT CHEST W/ CM
2 of 3 series · 15 of 36 positions shown, 18 images · IV contrast (APPLIED)
Comparison: Chest radiograph performed 11/04/2014

CLINICAL DATA: Drainage and pain at the prior site of the patient's
left-sided chest tube. Assess for deep infection. Initial encounter.

EXAM:
CT CHEST WITH CONTRAST
TECHNIQUE: Multidetector CT imaging of the chest was performed during
intravenous contrast administration.
CONTRAST:  75mL OMNIPAQUE IOHEXOL 300 MG/ML  SOLN

[Series 2: thorax 5.0 i31f 1 · axial · 0.71mm/px · z∈[-330,-40]mm · 12 of 68 slices shown, 15 images]
[im 5/68  mediastinal]
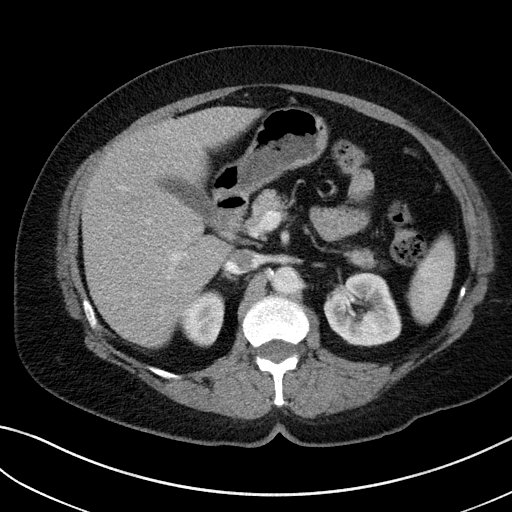
[im 5/68  lung]
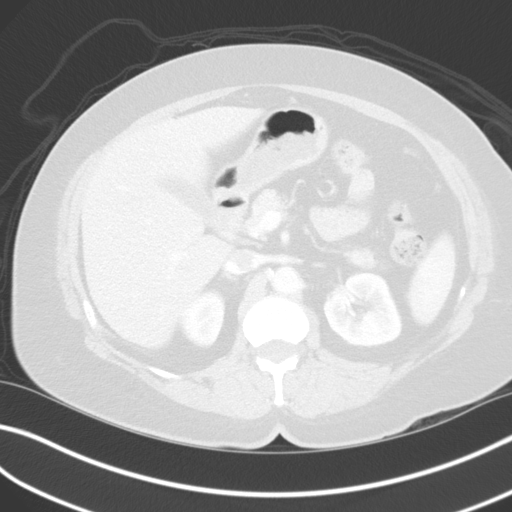
[im 10/68  lung]
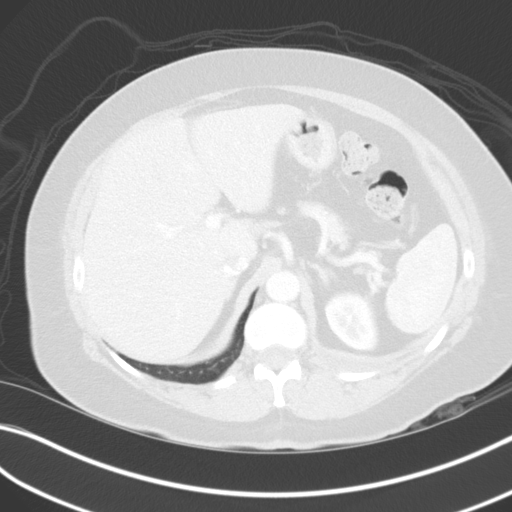
[im 15/68  lung]
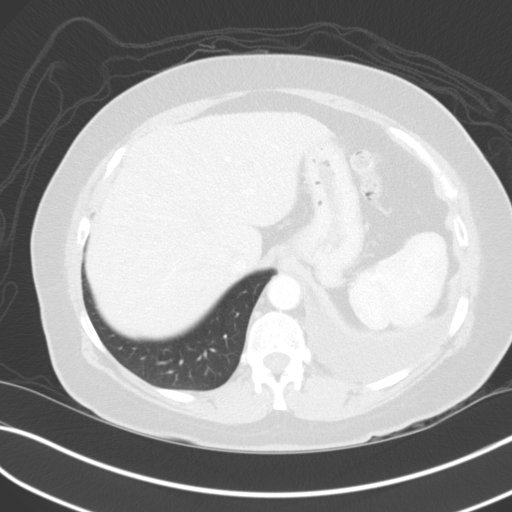
[im 20/68  lung]
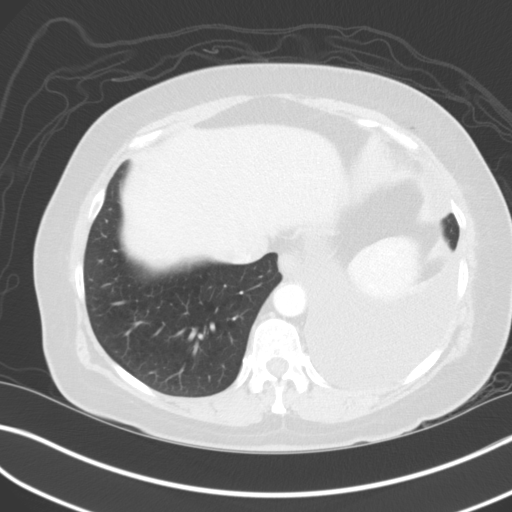
[im 25/68  mediastinal]
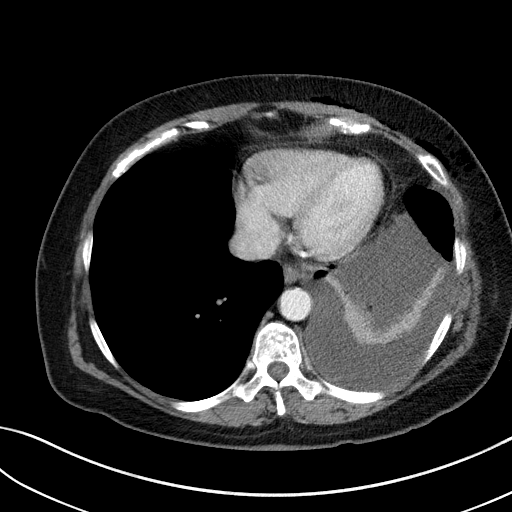
[im 25/68  lung]
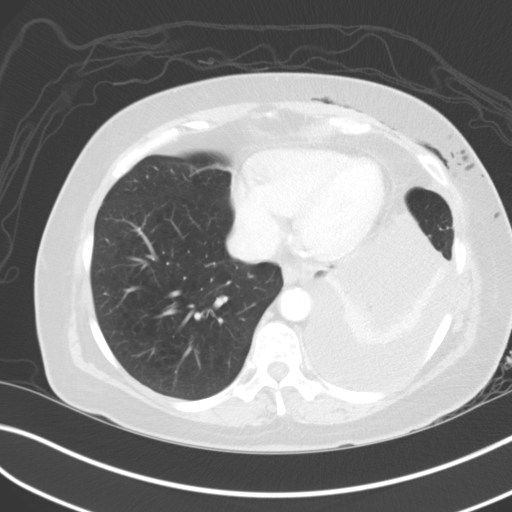
[im 30/68  lung]
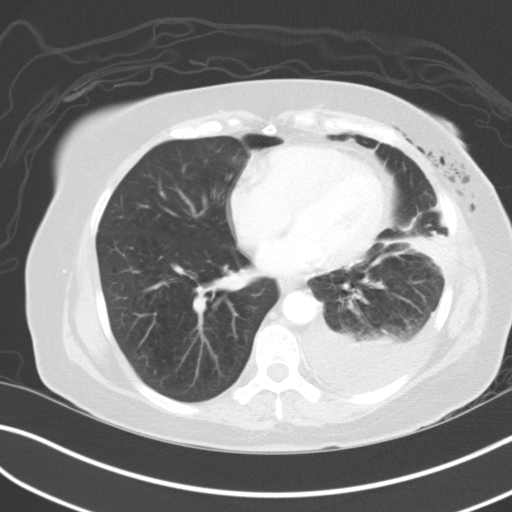
[im 38/68  lung]
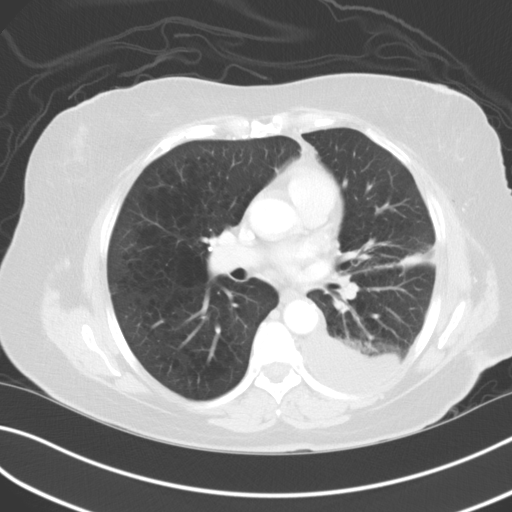
[im 43/68  lung]
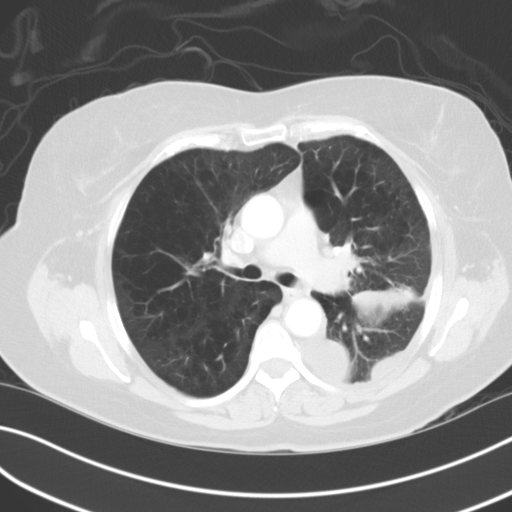
[im 48/68  mediastinal]
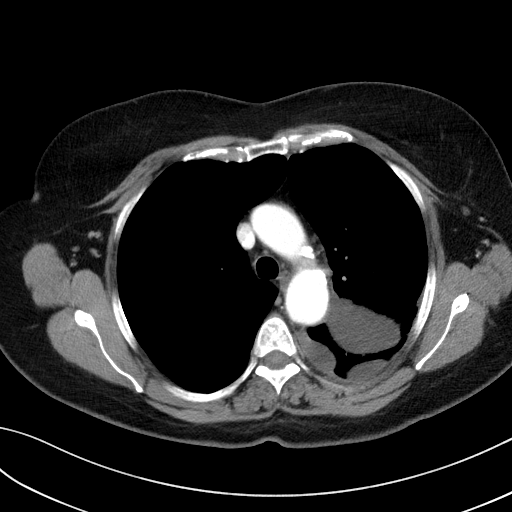
[im 48/68  lung]
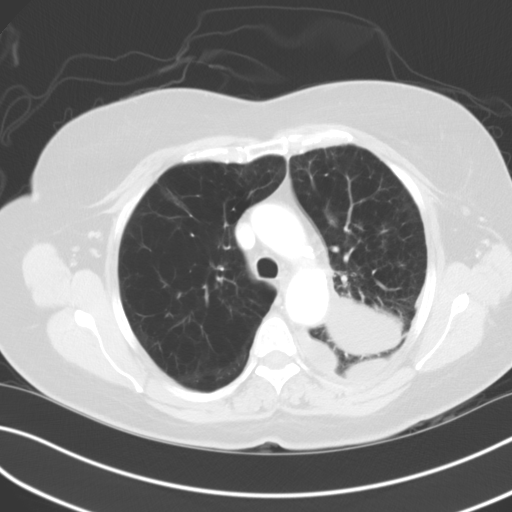
[im 53/68  lung]
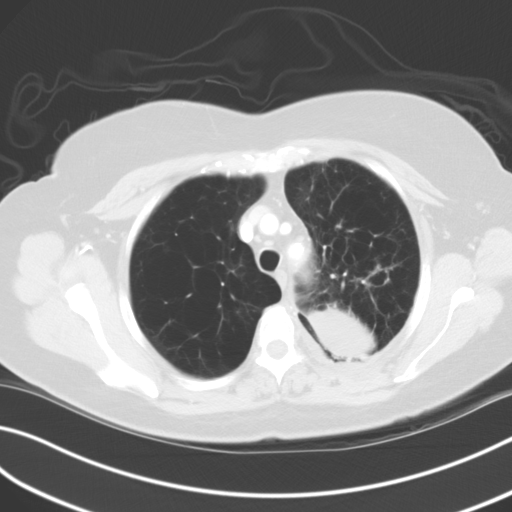
[im 58/68  lung]
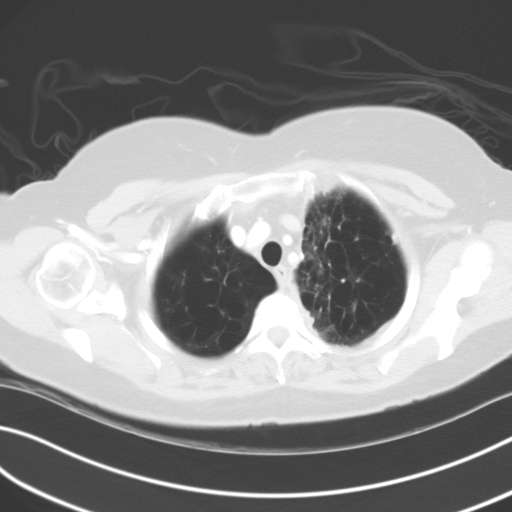
[im 63/68  lung]
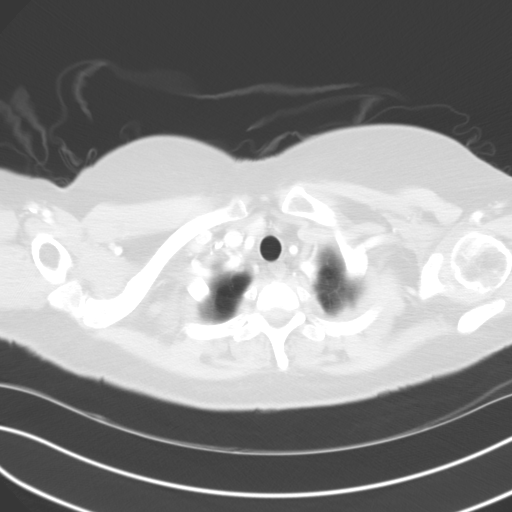

[Series 5: coronal · coronal · 0.66mm/px · 3 of 84 slices shown]
[im 17/84  lung]
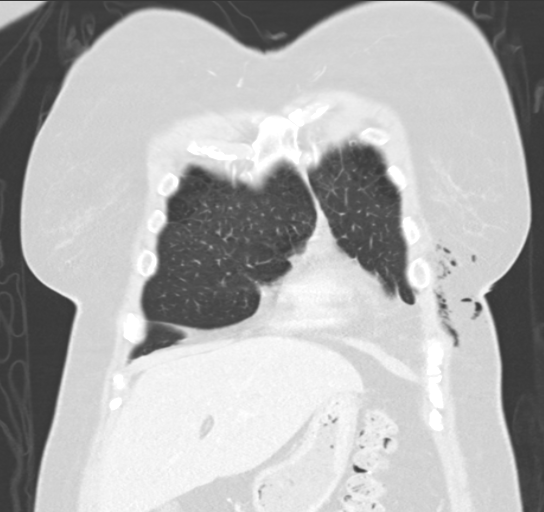
[im 34/84  lung]
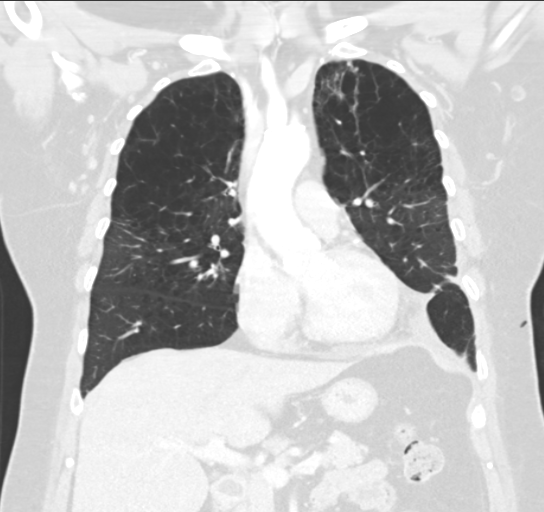
[im 50/84  lung]
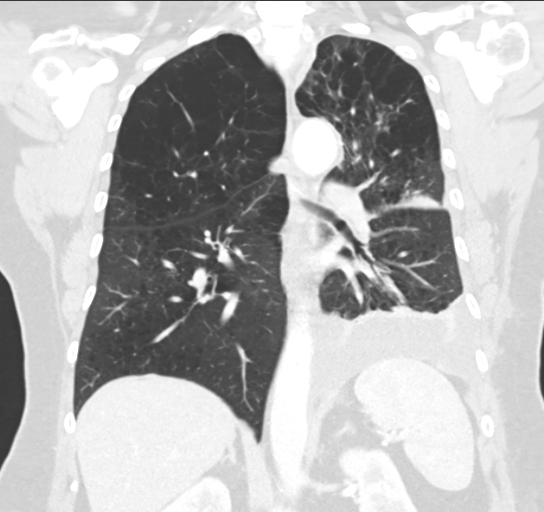

[15 of 36 positions shown; findings below may reference images not displayed]

FINDINGS: There is an unusual pattern of air along the left anterolateral
chest wall, extending along the inferior aspect of the left breast
and just overlying the anterior intercostal musculature. This is
near the course of the patient's prior chest tube. The appearance is
highly concerning for necrotizing fasciitis.

There is no evidence of abscess. A loculated small to moderate
left-sided pleural effusion is seen, with associated atelectasis.
Bilateral emphysematous change is noted, most prominent at the upper
lung lobes. No pneumothorax is identified. No masses are seen.

The mediastinum is unremarkable in appearance. No mediastinal
lymphadenopathy is seen. No pericardial effusions identified. The
great vessels are grossly unremarkable in appearance, aside from
mild scattered calcification along the proximal great vessels. The
visualized portions of the thyroid gland are unremarkable. No
axillary lymphadenopathy is seen.

The visualized portions of the liver and spleen are unremarkable.
The visualized portions of the pancreas, adrenal glands, gallbladder
and kidneys are within normal limits.

No acute osseous abnormalities are identified.
IMPRESSION: 1. Unusual complex pattern of air along the left anterolateral chest
wall, extending along the inferior aspect of the left breast and
just overlying the anterior intercostal musculature. This is near
the course of the patient's prior chest tube, and is highly
concerning for necrotizing fasciitis.
2. Loculated small to moderate left-sided pleural effusion with
associated atelectasis.
3. Bilateral emphysematous change, most prominent at the upper lung
lobes.
Critical Value/emergent results were called by telephone at the time
of interpretation on 11/08/2014 at [DATE] to Dr. LOKESH BANSAL, who
verbally acknowledged these results.

## 2016-06-25 MED FILL — ALBUTEROL 0.083% INHAL SOLN: (2.5 MG/3ML | 5 days supply | Qty: 90 | Fill #2

## 2016-06-25 MED FILL — ?ATORVASTATIN 20 MG TABLET: 20 | 30 days supply | Qty: 30 | Fill #5

## 2016-07-29 MED FILL — ALBUTEROL 0.083% INHAL SOLN: (2.5 MG/3ML | 5 days supply | Qty: 90 | Fill #3

## 2016-08-13 MED FILL — ?ATORVASTATIN 20 MG TABLET: 20 | 30 days supply | Qty: 30 | Fill #6

## 2016-08-29 ENCOUNTER — Ambulatory Visit (INDEPENDENT_AMBULATORY_CARE_PROVIDER_SITE_OTHER): Payer: Self-pay | Admitting: Internal Medicine

## 2016-08-29 ENCOUNTER — Encounter: Payer: Self-pay | Admitting: Internal Medicine

## 2016-08-29 ENCOUNTER — Ambulatory Visit (INDEPENDENT_AMBULATORY_CARE_PROVIDER_SITE_OTHER)
Admission: RE | Admit: 2016-08-29 | Discharge: 2016-08-29 | Disposition: A | Discharge status: Home / Self Care | Payer: Self-pay | Source: Ambulatory Visit | Attending: Internal Medicine | Admitting: Internal Medicine

## 2016-08-29 VITALS — BP 134/90 | HR 112 | Ht 63.0 in | Wt 190.0 lb

## 2016-08-29 DIAGNOSIS — J449 Chronic obstructive pulmonary disease, unspecified: Secondary | ICD-10-CM

## 2016-08-29 MED ORDER — UMECLIDINIUM-VILANTEROL 62.5-25 MCG/INH IN AEPB: 1.0000 | INHALATION_SPRAY | Freq: Every day | RESPIRATORY_TRACT | 0 refills | Status: DC

## 2016-08-29 MED ORDER — UMECLIDINIUM-VILANTEROL 62.5-25 MCG/INH IN AEPB: 1.0000 | INHALATION_SPRAY | Freq: Every day | RESPIRATORY_TRACT | 11 refills | Status: DC

## 2016-08-29 MED ORDER — UMECLIDINIUM BROMIDE 62.5 MCG/INH IN AEPB: 1.0000 | INHALATION_SPRAY | Freq: Every day | RESPIRATORY_TRACT | 0 refills | Status: DC

## 2016-08-29 MED ORDER — UMECLIDINIUM BROMIDE 62.5 MCG/INH IN AEPB: 1.0000 | INHALATION_SPRAY | Freq: Every day | RESPIRATORY_TRACT | 11 refills | Status: DC

## 2016-08-29 NOTE — Patient Instructions (Addendum)
Plan A = Automatic = symbicort 2 puffs every 12 hours and Incruse one click every am with the alternative being BEVESPI (made by the same company that provides you  with free Symbicort)  2 pffs every 12 hours  But best to let your primary care doctor help you with these swaps   Plan B = Backup Only use your albuterol as a rescue medication to be used if you can't catch your breath by resting or doing a relaxed purse lip breathing pattern.  - The less you use it, the better it will work when you need it. - Ok to use the inhaler up to 2 puffs  every 4 hours if you must but call for appointment if use goes up over your usual need - Don't leave home without it !!  (think of it like the spare tire for your car)   Plan C = Crisis - only use your albuterol nebulizer if you first try Plan B and it fails to help > ok to use the nebulizer up to every 4 hours but if start needing it regularly call for immediate appointment    Please remember to go to the  x-ray department downstairs in the basement  for your tests - we will call you with the results when they are available.   If you are satisfied with your treatment plan,  let your doctor know and he/she can either refill your medications or you can return here when your prescription runs out.     If in any way you are not 100% satisfied,  please tell us.  If 100% better, tell your friends!  Pulmonary follow up is as needed    ---------------------------------------------------------------------------  Plan A = Automtico = Symbicort 2 inhalaciones cada 12 horas e Incruse un clic cada maana con la alternativa BEVESPI (hecha por la misma compaa que le proporciona Symbicort gratis) 2 pffs cada 12 horas. Pero lo mejor es dejar que su mdico de atencin primaria lo ayude con estos intercambios Plan B = Respaldo Solo use su albuterol como medicamento de rescate para usar si no puede recuperar el aliento descansando o haciendo un patrn relajado de  respiracin de labios. - Cuanto menos lo uses, mejor funcionar cuando lo necesites. - De acuerdo, use Engineer, building servicesel inhalador hasta 2 inhalaciones cada 4 horas si debe hacerlo, pero llame para programar una cita si el uso aumenta por encima de sus necesidades habituales. - No salgas de casa sin eso! (pinsalo como el neumtico de repuesto para tu automvil) Plan C = Crisis - solo use su nebulizador de albuterol si prueba el Plan B por primera vez y no ayuda a Management consultantusar el nebulizador hasta cada 4 horas, pero si comienza a necesitarlo regularmente, solicite cita inmediata. Recuerde acudir al departamento de rayos X de la planta baja del stano para sus exmenes: le llamaremos con los resultados cuando estn disponibles. Si est satisfecho con su plan de tratamiento, infrmele a su mdico y l / ella puede volver a llenar sus medicamentos o puede regresar aqu cuando se le acaben las recetas.

## 2016-08-29 NOTE — Progress Notes (Signed)
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 PULMONARY CONSULT    Spontaneous Left Pneumothorax. Pt states that she has increased dyspnea with exertion. Pt c/o of occasional wet cough with yellow to clear mucus and chest pain with breathing. Pt denies any wheeze.   still way overusing saba at baseline mostly in neb form with doe x across the room and not using symbicort or pain meds correctly (see a/p) rec Plan A = Automatic = Symbiocrt 160 one twice daily and Incruse one click each am  Plan B = Backup - Only use your albuterol (Yellow/proventil) as a rescue medication Plan C = Crisis/ contingency - nebulizer, ok use with albuterol (or combination until you use it up)  every 4 hours if plan B is not working  For pain > tramadol 50 mg up to 2 every 4 hours  For nerves> alprazolam 0.5 mg up to one every 4 hours      11/24/2014  f/u ov/[name] [name] re: copd/ pfts on symb/ incruse still using saba  Chief Complaint  Patient presents with  . Follow-up    pt following for COPD: pt states she is doing pretty well, she is still having pain on her left side, but it us much better.  pt states her breathing has been very well. pt c/o increase SOB when she exerts herself. no c/o chest tightness. pt needs refills on meds.    doe = MMRC1 = can walk nl pace, flat grade, can't hurry or go uphills or steps s sob  rec Please remember to go to  the lab department downstairs for your tests - we will call you with the results when they are available. Symbicort 160 Take1 puffs first thing in am and then another1 puffs about 12 hours later.  Goal is not to need to rescue albuterol  much at all      01/31/2015  f/u ov/[name] [name] re: COPD III/ symbicort 160 2bid/ incruse and prn saba  Chief Complaint  Patient presents with  . Follow-up    Pt states that breathing is unchanged since last visit. Pt states that she is under a lot of stress right now which has made her breathing worse   2x daily neb but not noct/ no hfa use  Pain L cw same as post op never improved, very sensitive chest wall / not resp to tramadol so far rec Please see patient coordinator before you leave today  to schedule referral to Internal Medicine/ Dr Drue NovelPaz  Zostrix cream 4 x daily (over the counter) - as improves can reduce to bedtime only  Plan A = Automatic = Symbiocrt 160 one twice daily and Incruse one click each am  Plan B = Backup - Only use your albuterol (Yellow/proventil) as a rescue medication Plan C = Crisis/ contingency - nebulizer, ok use with albuterol (or combination until you use it up)  every 4 hours  if plan B is not working    08/29/2016  f/u ov/[name] [name] re:   COPD GOLD III/ quit smoking  02/2016 Chief Complaint  Patient presents with  . Follow-up    l/s 01/2015- pt stopped smoking X6 mos ago, noted weight gain, since then c/o increased SOB, sinus congestion, PND, hoarseness.    walking 30 min s stopping sev times a week better if use albturol first despite using symbicort 160 2bid  Does not recall response to prednisone or incruse  Easily confused with details of care / access to meds issues  Doe = MMRC2 = can't walk a nl pace on a flat grade s sob but does fine slow and flat   No obvious day to day or daytime variability or assoc excess/ purulent sputum or mucus plugs or hemoptysis or cp or chest tightness, subjective wheeze or overt   hb symptoms. No  unusual exp hx or h/o childhood pna/ asthma or knowledge of premature birth.  Sleeping ok without nocturnal  or early am exacerbation  of respiratory  c/o's or need for noct saba. Also denies any obvious fluctuation of symptoms with weather or environmental changes or other aggravating or alleviating factors except as outlined above   Current Medications, Allergies, Complete Past Medical History, Past Surgical History, Family History, and Social History were reviewed in [facility] electronic [facility] record.  ROS  The following are not active complaints unless bolded sore throat, dysphagia, dental problems, itching, sneezing,  nasal congestion or excess/ purulent secretions, ear ache,   fever, chills, sweats, unintended wt loss, classically pleuritic or exertional cp,  orthopnea pnd or leg swelling, presyncope, palpitations, abdominal pain, anorexia, nausea, vomiting, diarrhea  or change in bowel or bladder habits, change in stools or urine, dysuria,hematuria,  rash, arthralgias, visual complaints, headache, numbness, weakness or ataxia or problems with walking or coordination,  change in mood/affect or memory.                    Objective:   Physical Exam  amb latino female nad/ broken english, very confused with details of care  11/24/2014      177 > 01/31/2015 180 >  08/29/2016    190     11/10/14 177 lb 3.2 oz (80.377 kg)  11/08/14 178 lb 5 oz (80.882 kg)  10/26/14 177 lb 1.6 oz (80.332 kg)    Vital signs reviewed  - Note on arrival 02 sats  94% on RA     HEENT: nl dentition, turbinates, and oropharynx. Nl external ear canals without cough reflex   NECK :  without JVD/Nodes/TM/ nl carotid upstrokes bilaterally   LUNGS: no acc muscle use, minimal insp and exp rhonchi bilaterally   CV:  RRR  no s3 or murmur or increase in P2, no edema   ABD:  soft and nontender with nl excursion in the supine position. No bruits or organomegaly, bowel sounds nl  MS:  warm without  deformities, calf tenderness, cyanosis or clubbing  SKIN: warm and dry    NEURO:  alert, approp, no deficits      CXR PA and Lateral:   08/29/2016 :    I personally reviewed images and agree with radiology impression as follows:    1. COPD. Chronic interstitial changes consistent with chronic interstitial lung disease. 2.  No acute abnormality identified.     Assessment & Plan:

## 2016-08-30 ENCOUNTER — Encounter: Payer: Self-pay | Admitting: Internal Medicine

## 2016-08-30 NOTE — Assessment & Plan Note (Signed)
Body mass index is 33.66 kg/m.  -  trending up off cigs  Lab Results  Component Value Date   TSH 2.08 01/01/2016     Contributing to gerd risk/ doe/reviewed the need and the process to achieve and maintain neg calorie balance > defer f/u primary care including intermittently monitoring thyroid status

## 2016-08-30 NOTE — Assessment & Plan Note (Addendum)
-   Quit smoking 10/26/14  - 11/10/2014  new maint rx = symb 160/incruse sample - Alpha one AT 11/24/2014 >  MM/ nl levels - 01/31/2015  extensive coaching HFA effectiveness =    90%  - spirometry 01/31/2015  FEV1  0.85 (37%) ratio 34  - Spirometry 08/29/2016  FEV1 0.71 (30%)  Ratio 36  > added back LAMA = incruse samples   I had an extended final summary discussion with the patient reviewing all relevant studies completed to date and  lasting 15 to 20 minutes of a 25 minute visit on the following issues:    Pt is Group B in terms of symptom/risk and laba/lama therefore appropriate rx at this point but unclear whether has access to any free supplies other than symbicort so rec continue symbicort and added incruse one click each am   Informed by drug rep should be able to get Bevespi (LAMA/LABA) and take 2 bid if above rx ineffective or unaffordable but noted a lot of barriers to care in this clinic related to language and finances that are best handled by her spanish speaking primary care doctor to whom I referred her and would be happy to serve in consulting role for this purpose but not directly manage her care going forward as this is round peg to square hole situation I can't help her with, accept in short run with samples and instructions on how to use devices which I attempted to do today.   see avs for instructions unique to this ov  Each maintenance medication was reviewed in detail including most importantly the difference between maintenance and as needed and under what circumstances the prns are to be used.  Please see AVS for specific  Instructions which are unique to this visit and I personally typed out  which were reviewed in detail in writing with the patient and a copy provided.     Pulmonary f/u is prn / instructions provided in spanish

## 2016-09-01 NOTE — Progress Notes (Signed)
LMTCB

## 2016-09-02 NOTE — Progress Notes (Signed)
LMTCB

## 2016-09-08 NOTE — Progress Notes (Signed)
Spoke with pt and notified of results per Dr. Wert. Pt verbalized understanding and denied any questions. 

## 2016-09-24 MED FILL — ?ATORVASTATIN 20 MG TABLET: 20 | 30 days supply | Qty: 30 | Fill #7

## 2016-09-24 MED FILL — ALBUTEROL 0.083% INHAL SOLN: (2.5 MG/3ML | 5 days supply | Qty: 90 | Fill #4

## 2016-11-04 MED FILL — ?ALBUTEROL SUL 2.5 MG/3 MLS: (2.5 MG/3ML | 5 days supply | Qty: 90 | Fill #5

## 2016-11-04 MED FILL — ?ATORVASTATIN 20 MG TABLET: 20 | 30 days supply | Qty: 30 | Fill #8

## 2016-11-07 ENCOUNTER — Telehealth: Payer: Self-pay | Admitting: Internal Medicine

## 2016-11-07 NOTE — Telephone Encounter (Signed)
lmomtcb x 1 for the pt 

## 2016-11-10 NOTE — Telephone Encounter (Signed)
lmtcb x2 for pt. 

## 2016-11-11 NOTE — Telephone Encounter (Signed)
MW has signed the pt assistance for her Symbicort, however, she did not fill out the financial portion or sign it  I spoke with her and notified her of this She wants to come pick up forms to complete, and I have placed these up front  Nothing further needed per pt

## 2016-11-11 NOTE — Telephone Encounter (Signed)
lmtcb x3 for pt. 

## 2016-11-13 ENCOUNTER — Telehealth: Payer: Self-pay | Admitting: Internal Medicine

## 2016-11-13 MED ORDER — ALBUTEROL SULFATE HFA 108 (90 BASE) MCG/ACT IN AERS: 1.0000 | INHALATION_SPRAY | Freq: Four times a day (QID) | RESPIRATORY_TRACT | 3 refills | Status: DC | PRN

## 2016-11-13 MED ORDER — UMECLIDINIUM BROMIDE 62.5 MCG/INH IN AEPB: 1.0000 | INHALATION_SPRAY | Freq: Every day | RESPIRATORY_TRACT | 3 refills | Status: DC

## 2016-11-13 NOTE — Telephone Encounter (Signed)
Spoke with pt, advised pt Rx's went to her pharmacy. Pt understood and nothing further is needed.

## 2016-11-18 ENCOUNTER — Encounter: Payer: Self-pay | Admitting: Physician Assistant

## 2016-11-18 ENCOUNTER — Ambulatory Visit: Payer: Self-pay | Attending: Internal Medicine | Admitting: Physician Assistant

## 2016-11-18 ENCOUNTER — Other Ambulatory Visit: Payer: Self-pay

## 2016-11-18 VITALS — BP 123/85 | HR 78 | Temp 98.1°F | Resp 18 | Ht 64.0 in | Wt 191.6 lb

## 2016-11-18 DIAGNOSIS — R42 Dizziness and giddiness: Secondary | ICD-10-CM | POA: Insufficient documentation

## 2016-11-18 DIAGNOSIS — F419 Anxiety disorder, unspecified: Secondary | ICD-10-CM | POA: Insufficient documentation

## 2016-11-18 DIAGNOSIS — I6300 Cerebral infarction due to thrombosis of unspecified precerebral artery: Secondary | ICD-10-CM

## 2016-11-18 DIAGNOSIS — J310 Chronic rhinitis: Secondary | ICD-10-CM | POA: Insufficient documentation

## 2016-11-18 DIAGNOSIS — Z79899 Other long term (current) drug therapy: Secondary | ICD-10-CM | POA: Insufficient documentation

## 2016-11-18 DIAGNOSIS — H539 Unspecified visual disturbance: Secondary | ICD-10-CM | POA: Insufficient documentation

## 2016-11-18 DIAGNOSIS — T485X5A Adverse effect of other anti-common-cold drugs, initial encounter: Secondary | ICD-10-CM

## 2016-11-18 DIAGNOSIS — T485X1A Poisoning by other anti-common-cold drugs, accidental (unintentional), initial encounter: Secondary | ICD-10-CM

## 2016-11-18 DIAGNOSIS — Z7951 Long term (current) use of inhaled steroids: Secondary | ICD-10-CM | POA: Insufficient documentation

## 2016-11-18 DIAGNOSIS — Z8673 Personal history of transient ischemic attack (TIA), and cerebral infarction without residual deficits: Secondary | ICD-10-CM | POA: Insufficient documentation

## 2016-11-18 DIAGNOSIS — J449 Chronic obstructive pulmonary disease, unspecified: Secondary | ICD-10-CM | POA: Insufficient documentation

## 2016-11-18 DIAGNOSIS — Z7982 Long term (current) use of aspirin: Secondary | ICD-10-CM | POA: Insufficient documentation

## 2016-11-18 NOTE — Progress Notes (Signed)
Patient ID: [name] [name], female   DOB: 11-03-1950, 66 y.o.   MRN: [id number]     [name] [name], is a 66 y.o. female  [id number]  [id number]  DOB - [birth year]  Subjective:  Chief Complaint and HPI: [name] [name] is a 66 y.o. female here today for issues with her L eye, dizziness, and congestion. L eye problems have been occurring since having a minor stroke about 1 year ago.  She had been told that this may or may not resolve.  There are no new issues.  It feels as though her L eye is "jumpy" at times.  No acute vision changes or deficits.   Dizziness has also been occurring for about 1 year.  No CP.  No N/V.   Seems like it is worse lately.  It is usually fleeting and lasts only a few minutes.  Worse in the morning at times.  No alleviating or exacerbating factors.    Also c/o ear pressure and sinus congestion.  Uses afrin daily.  Circled #1 for #9 on her PHQ-9, but she denies SI/HI.  She just says at times that her life has changed so much that she wishes she wasn't here.  Her son now lives in [place] and she doesn't see him much.  [place] and feels afraid at times.  Used to work full-time but has been unable to work for a few years.  +financial stressors.  Admits to excessive worry and anxiety-doesn't want to see a counselor or try meds.  She is going to try and go back to work soon.    No interpreter is needed or used.    ROS:   Constitutional:  No f/c, No night sweats, No unexplained weight loss. EENT:  No blurry vision, No hearing changes. No mouth, throat problems.  +ear and sinus congestion.    Respiratory: No cough, No SOB Cardiac: No CP, no palpitations GI:  No abd pain, No N/V/D. GU: No Urinary s/sx Musculoskeletal: No joint pain Neuro: No headache, + dizziness, no motor weakness.  Skin: No rash Endocrine:  No polydipsia. No polyuria.  Psych: Denies SI/HI  No problems updated.  ALLERGIES: No Known Allergies  PAST MEDICAL HISTORY: Past  Medical History:  Diagnosis Date  . Anxiety   . COPD (chronic obstructive pulmonary disease) (HCC)   . Diverticulitis   . Migraines     MEDICATIONS AT HOME: Prior to Admission medications   Medication Sig Start Date End Date Taking? Authorizing Provider  acetaminophen (TYLENOL) 500 MG tablet Take 1 tablet (500 mg total) by mouth every 6 (six) hours as needed. 03/18/16   Loletta Specter, PA-C  albuterol (PROVENTIL HFA;VENTOLIN HFA) 108 (90 Base) MCG/ACT inhaler Inhale 1-2 puffs into the lungs every 6 (six) hours as needed for wheezing or shortness of breath. 11/13/16   Nyoka Cowden, MD  albuterol (PROVENTIL) (2.5 MG/3ML) 0.083% nebulizer solution Take 3 mLs (2.5 mg total) by nebulization every 4 (four) hours as needed for wheezing or shortness of breath. 01/01/16   Pete Glatter, MD  aspirin 325 MG tablet Take 325 mg by mouth daily.    [provider]  atorvastatin (LIPITOR) 20 MG tablet Take 1 tablet (20 mg total) by mouth daily at 6 PM. 01/01/16   Langeland, Dawn T, MD  budesonide-formoterol [facility]) 160-4.5 MCG/ACT inhaler Take 2 puffs first thing in am and then another 2 puffs about 12 hours later. 01/01/16   Pete Glatter, MD  buPROPion (WELLBUTRIN SR) 150 MG 12 hr tablet Take 1 tablet (150 mg total) by mouth 2 (two) times daily. 01/15/16   Pete Glatter, MD  diclofenac sodium (VOLTAREN) 1 % GEL Apply 2 g topically 4 (four) times daily. 01/01/16   Pete Glatter, MD  gabapentin (NEURONTIN) 300 MG capsule Take 1 capsule (300 mg total) by mouth at bedtime. 01/01/16   Pete Glatter, MD  hydrOXYzine (ATARAX/VISTARIL) 10 MG tablet Take 1 tablet (10 mg total) by mouth 3 (three) times daily as needed. 11/08/15   Wanda Plump, MD  ibuprofen (ADVIL,MOTRIN) 200 MG tablet Take 400 mg by mouth every 6 (six) hours as needed for headache or moderate pain.    [provider]  oxymetazoline (AFRIN) 0.05 % nasal spray Place 1 spray into both nostrils as needed for  congestion.    [provider]  umeclidinium bromide (INCRUSE ELLIPTA) 62.5 MCG/INH AEPB Inhale 1 puff into the lungs daily. 08/29/16   Nyoka Cowden, MD  umeclidinium bromide (INCRUSE ELLIPTA) 62.5 MCG/INH AEPB Inhale 1 puff into the lungs daily. 11/13/16   Nyoka Cowden, MD     Objective:  EXAM:   Vitals:   11/18/16 1031  BP: 123/85  Pulse: 78  Resp: 18  Temp: 98.1 F (36.7 C)  TempSrc: Oral  SpO2: 96%  Weight: 191 lb 9.6 oz (86.9 kg)  Height:  (1.626 m)    General appearance : A&OX3. NAD. Non-toxic-appearing HEENT: Atraumatic and Normocephalic.  PERRLA. EOM intact. Fundi benign TM clear B. Mouth-MMM, post pharynx WNL w/o erythema, No PND. Neck: supple, no JVD. No cervical lymphadenopathy. No thyromegaly.  No carotid bruit.   Chest/Lungs:  Breathing-non-labored, Good air entry bilaterally, breath sounds normal without rales, rhonchi, or wheezing  CVS: S1 S2 regular, no murmurs, gallops, rubs  Extremities: Bilateral Lower Ext shows no edema, both legs are warm to touch with = pulse throughout Neurology:  CN II-XII grossly intact, Non focal.   Psych:  TP linear. J/I WNL. +pressured speech. Appropriate eye contact and affect.  Skin:  No Rash  Data Review Lab Results  Component Value Date   HGBA1C 5.6 12/18/2015     Assessment & Plan   1. Anxiety Refused meds and didn't want to wait and meet with Jenel Lucks today.  - Vitamin D, 25-hydroxy - TSH  2. Cerebrovascular accident (CVA) due to thrombosis of precerebral artery (HCC) - Lipid panel  3. Dizziness EKG WNL today.  This is an ongoing/long-standing issue.  Definitely her chronic sinus issues and sfrin use is not helping.   - Comprehensive metabolic panel - TSH  4. Vision changes No acute issues today.  I have advised her to follow-up with her eye doctor for her annual exam.    5. Rhinitis medicamentosa Stop afrin.  Use flonase daily(she has this) and can use sudafed prn.  I have counseled  her extensively on this.    Patient have been counseled extensively about nutrition and exercise, self-care, finding new activities in life to enjoy, developing hobbies, etc.  I spent >35 mins face to face with patient and more than 1/2 of the time was counseling  Return in about 6 weeks (around 12/30/2016) for assign PCP; f/up anxiety and dizziness.  The patient was given clear instructions to go to ER or return to medical center if symptoms don't improve, worsen or new problems develop. The patient verbalized understanding. The patient was told to call to get lab results if they  haven't heard anything in the next week.     [place]ian Co, PA-C [facility] [place], [place] [phone]   11/18/2016, 12:59 PM

## 2016-11-19 LAB — VITAMIN D 25 HYDROXY (VIT D DEFICIENCY, FRACTURES): Vit D, 25-Hydroxy: 43.5 ng/mL (ref 30.0–100.0)

## 2016-11-19 LAB — LIPID PANEL
CHOLESTEROL TOTAL: 136 mg/dL (ref 100–199)
Chol/HDL Ratio: 2.4 ratio (ref 0.0–4.4)
HDL: 57 mg/dL (ref >39)
LDL CALC: 53 mg/dL (ref 0–99)
Triglycerides: 129 mg/dL (ref 0–149)
VLDL Cholesterol Cal: 26 mg/dL (ref 5–40)

## 2016-11-19 LAB — COMPREHENSIVE METABOLIC PANEL
ALT: 20 IU/L (ref 0–32)
AST: 19 IU/L (ref 0–40)
Albumin/Globulin Ratio: 1.4 (ref 1.2–2.2)
Albumin: 4.2 g/dL (ref 3.6–4.8)
Alkaline Phosphatase: 119 IU/L — ABNORMAL HIGH (ref 39–117)
BUN/Creatinine Ratio: 20 (ref 12–28)
BUN: 18 mg/dL (ref 8–27)
Bilirubin Total: 0.7 mg/dL (ref 0.0–1.2)
CALCIUM: 10.6 mg/dL — AB (ref 8.7–10.3)
CHLORIDE: 99 mmol/L (ref 96–106)
CO2: 25 mmol/L (ref 20–29)
CREATININE: 0.88 mg/dL (ref 0.57–1.00)
GFR, EST AFRICAN AMERICAN: 80 mL/min/{1.73_m2} (ref >59)
GFR, EST NON AFRICAN AMERICAN: 69 mL/min/{1.73_m2} (ref >59)
GLUCOSE: 94 mg/dL (ref 65–99)
Globulin, Total: 3 g/dL (ref 1.5–4.5)
POTASSIUM: 4.4 mmol/L (ref 3.5–5.2)
Sodium: 139 mmol/L (ref 134–144)
TOTAL PROTEIN: 7.2 g/dL (ref 6.0–8.5)

## 2016-11-19 LAB — TSH: TSH: 1.71 u[IU]/mL (ref 0.450–4.500)

## 2016-11-20 ENCOUNTER — Telehealth: Payer: Self-pay | Admitting: *Deleted

## 2016-11-20 NOTE — Telephone Encounter (Signed)
-----   Message from Anders SimmondsAngela M McClung, [place]-C sent at 11/19/2016 10:16 PM EDT ----- Please call patient.  Her labs are normal.  Follow-up as planned. Thanks, Georgian CoAngela McClung, PA-C

## 2016-11-20 NOTE — Telephone Encounter (Signed)
Medical Assistant left message on patient's home and cell voicemail. Voicemail states to give a call back to [place] with [facility] at [phone]. Patient is aware of labs being normal and to follow up as planned.

## 2016-12-15 MED FILL — ?ALBUTEROL SUL 2.5 MG/3 MLS: (2.5 MG/3ML | 30 days supply | Qty: 540 | Fill #6

## 2016-12-15 MED FILL — ?ATORVASTATIN 20 MG TABLET: 20 | 30 days supply | Qty: 30 | Fill #9

## 2016-12-29 ENCOUNTER — Encounter: Payer: Self-pay | Admitting: Family Medicine

## 2016-12-29 ENCOUNTER — Ambulatory Visit: Payer: Self-pay | Attending: Family Medicine | Admitting: Family Medicine

## 2016-12-29 ENCOUNTER — Ambulatory Visit: Payer: Self-pay | Attending: Family Medicine

## 2016-12-29 VITALS — BP 106/72 | HR 79 | Temp 98.0°F | Resp 18 | Ht 65.0 in | Wt 195.2 lb

## 2016-12-29 DIAGNOSIS — Z23 Encounter for immunization: Secondary | ICD-10-CM

## 2016-12-29 DIAGNOSIS — H60392 Other infective otitis externa, left ear: Secondary | ICD-10-CM

## 2016-12-29 DIAGNOSIS — Z7982 Long term (current) use of aspirin: Secondary | ICD-10-CM | POA: Insufficient documentation

## 2016-12-29 DIAGNOSIS — F5102 Adjustment insomnia: Secondary | ICD-10-CM

## 2016-12-29 DIAGNOSIS — J029 Acute pharyngitis, unspecified: Secondary | ICD-10-CM

## 2016-12-29 DIAGNOSIS — Z79899 Other long term (current) drug therapy: Secondary | ICD-10-CM | POA: Insufficient documentation

## 2016-12-29 DIAGNOSIS — J449 Chronic obstructive pulmonary disease, unspecified: Secondary | ICD-10-CM

## 2016-12-29 DIAGNOSIS — F411 Generalized anxiety disorder: Secondary | ICD-10-CM

## 2016-12-29 DIAGNOSIS — H6092 Unspecified otitis externa, left ear: Secondary | ICD-10-CM | POA: Insufficient documentation

## 2016-12-29 LAB — POCT RAPID STREP A (OFFICE): RAPID STREP A SCREEN: NEGATIVE

## 2016-12-29 MED ORDER — TRAZODONE HCL 50 MG PO TABS: 25.0000 mg | ORAL_TABLET | Freq: Every evening | ORAL | 0 refills | Status: DC | PRN

## 2016-12-29 MED ORDER — ALBUTEROL SULFATE HFA 108 (90 BASE) MCG/ACT IN AERS: 1.0000 | INHALATION_SPRAY | Freq: Four times a day (QID) | RESPIRATORY_TRACT | 3 refills | Status: DC | PRN

## 2016-12-29 MED ORDER — PHENOL 1.4 % MT LIQD: 1.0000 | OROMUCOSAL | 0 refills | Status: DC | PRN

## 2016-12-29 MED ORDER — ALBUTEROL SULFATE (2.5 MG/3ML) 0.083% IN NEBU: 2.5000 mg | INHALATION_SOLUTION | RESPIRATORY_TRACT | 12 refills | Status: DC | PRN

## 2016-12-29 MED ORDER — OFLOXACIN 0.3 % OT SOLN: 10.0000 [drp] | Freq: Every day | OTIC | 0 refills | Status: DC

## 2016-12-29 MED ORDER — AMOXICILLIN 875 MG PO TABS: 875.0000 mg | ORAL_TABLET | Freq: Two times a day (BID) | ORAL | 0 refills | Status: DC

## 2016-12-29 MED ORDER — BUDESONIDE-FORMOTEROL FUMARATE 160-4.5 MCG/ACT IN AERO: INHALATION_SPRAY | RESPIRATORY_TRACT | 3 refills | Status: DC

## 2016-12-29 MED FILL — traZODone HCL 50 MG TABS: 50 | 30 days supply | Qty: 30 | Fill #0

## 2016-12-29 MED FILL — ALBUTEROL 0.083% INHAL SOLN: (2.5 MG/3ML | 5 days supply | Qty: 90 | Fill #0

## 2016-12-29 MED FILL — AMOXICILLIN 875 MG TABLET: 875 | 5 days supply | Qty: 10 | Fill #0

## 2016-12-29 MED FILL — !VENTOLIN HFA INHALER: 108 (90 BAS | 25 days supply | Qty: 18 | Fill #0

## 2016-12-29 NOTE — Patient Instructions (Signed)
Otitis Externa Otitis externa is an infection of the outer ear canal. The outer ear canal is the area between the outside of the ear and the eardrum. Otitis externa is sometimes called "swimmer's ear." Follow these instructions at home:  If you were given antibiotic ear drops, use them as told by your doctor. Do not stop using them even if your condition gets better.  Take over-the-counter and prescription medicines only as told by your doctor.  Keep all follow-up visits as told by your doctor. This is important. How is this prevented?  Keep your ear dry. Use the corner of a towel to dry your ear after you swim or bathe.  Try not to scratch or put things in your ear. Doing these things makes it easier for germs to grow in your ear.  Avoid swimming in lakes, dirty water, or pools that may not have the right amount of a chemical called chlorine.  Consider making ear drops and putting 3 or 4 drops in each ear after you swim. Ask your doctor about how you can make ear drops. Contact a doctor if:  You have a fever.  After 3 days your ear is still red, swollen, or painful.  After 3 days you still have pus coming from your ear.  Your redness, swelling, or pain gets worse.  You have a really bad headache.  You have redness, swelling, pain, or tenderness behind your ear. This information is not intended to replace advice given to you by your health care provider. Make sure you discuss any questions you have with your health care provider. Document Released: 07/09/2007 Document Revised: 02/15/2015 Document Reviewed: 10/30/2014 Elsevier Interactive Patient Education  2018 Elsevier Inc.  

## 2016-12-29 NOTE — Progress Notes (Signed)
Patient is here for establish care   Patient complains throat pain & left ear pain
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
## 2016-12-30 ENCOUNTER — Ambulatory Visit: Payer: Self-pay | Admitting: Neurology

## 2017-01-06 ENCOUNTER — Other Ambulatory Visit: Payer: Self-pay | Admitting: Family Medicine

## 2017-01-06 DIAGNOSIS — J449 Chronic obstructive pulmonary disease, unspecified: Secondary | ICD-10-CM

## 2017-01-06 MED ORDER — BUDESONIDE-FORMOTEROL FUMARATE 160-4.5 MCG/ACT IN AERO: INHALATION_SPRAY | RESPIRATORY_TRACT | 3 refills | Status: DC

## 2017-02-02 ENCOUNTER — Telehealth: Payer: Self-pay | Admitting: Internal Medicine

## 2017-02-02 DIAGNOSIS — J449 Chronic obstructive pulmonary disease, unspecified: Secondary | ICD-10-CM

## 2017-02-02 MED ORDER — BUDESONIDE-FORMOTEROL FUMARATE 160-4.5 MCG/ACT IN AERO: INHALATION_SPRAY | RESPIRATORY_TRACT | 3 refills | Status: DC

## 2017-02-02 NOTE — Telephone Encounter (Signed)
Spoke with patient Pt requesting refill on symbicort 160  [facility]icort [facility] [place] off [phone]

## 2017-03-02 ENCOUNTER — Ambulatory Visit: Payer: Self-pay | Admitting: Family Medicine

## 2017-07-13 MED FILL — ALBUTEROL 0.083% INHAL SOLN: (2.5 MG/3ML | 5 days supply | Qty: 90 | Fill #1

## 2017-08-04 MED FILL — ?ALBUTEROL SUL 2.5 MG/3 MLS: (2.5 MG/3ML | 5 days supply | Qty: 90 | Fill #2

## 2017-09-01 ENCOUNTER — Telehealth: Payer: Self-pay | Admitting: Internal Medicine

## 2017-09-01 NOTE — Telephone Encounter (Signed)
Patient dropping off patient assistance forms that she received [facility], for Symbicort and Incruse. Patient stated that she needs Dr. Sherene SiresWert to sign.  Last OV was 08/29/16, with Dr. Sherene SiresWert. Offered to make the Patient a new appointment with one of our providers. Patient refused and stated that she could not make an appointment with a provider, because she can not afford it at this time. I explained that Dr. Sherene SiresWert was seeing Patients at this time, and she would have to come back and pick up signed paperwork, if Dr. Sherene SiresWert signed them.  Patient stated understanding.  Patient can be reached at [phone].  Papers placed in Dr. Thurston HoleWert's box at Assur[employer]lie's desk.  Will route to [place] to follow up

## 2017-09-01 NOTE — Telephone Encounter (Signed)
We can't prescribe meds for pts > 1 year out so rec give samples until she can come see one of our NP's to certify she still needs these meds

## 2017-09-01 NOTE — Telephone Encounter (Signed)
Form placed in Dr Thurston HoleWert's lookat to be signed

## 2017-09-03 NOTE — Telephone Encounter (Signed)
Called and spoke with pt regarding symbicort 160 pt assistance forms Pt does not have a job at the moment, and will not fill paperwork out, and doesn't want to schedule appt Pt will do without the inhalers symb 160 and incruse until she has a job Nothing further needed at this time.

## 2017-09-04 ENCOUNTER — Telehealth: Payer: Self-pay | Admitting: Internal Medicine

## 2017-09-04 DIAGNOSIS — J449 Chronic obstructive pulmonary disease, unspecified: Secondary | ICD-10-CM

## 2017-09-04 MED ORDER — BUDESONIDE-FORMOTEROL FUMARATE 160-4.5 MCG/ACT IN AERO: INHALATION_SPRAY | RESPIRATORY_TRACT | 0 refills | Status: DC

## 2017-09-04 NOTE — Telephone Encounter (Signed)
Spoke with the pt  I advised her that Dr Sherene SiresWert did sign her pt assistance forms and I mailed them in the attatched envelope she provided  I gave her a sample of symbicort to last her until she can get approved  I advised she needs appt for any additional rf through this office and asked her to bring drug formulary to next visit  She verbalized understanding

## 2017-09-16 MED FILL — ?ALBUTEROL SUL 2.5 MG/3 MLS: (2.5 MG/3ML | 5 days supply | Qty: 90 | Fill #3

## 2017-10-22 MED FILL — ALBUTEROL 0.083% INHAL SOLN: (2.5 MG/3ML | 5 days supply | Qty: 90 | Fill #4

## 2017-12-04 MED FILL — ALBUTEROL SUL 2.5 MG/3 ML S: (2.5 MG/3ML | 5 days supply | Qty: 90 | Fill #5

## 2018-01-14 ENCOUNTER — Other Ambulatory Visit: Payer: Self-pay | Admitting: Pharmacist

## 2018-01-14 DIAGNOSIS — J449 Chronic obstructive pulmonary disease, unspecified: Secondary | ICD-10-CM

## 2018-01-14 MED ORDER — ALBUTEROL SULFATE (2.5 MG/3ML) 0.083% IN NEBU: 2.5000 mg | INHALATION_SOLUTION | RESPIRATORY_TRACT | 0 refills | Status: DC | PRN

## 2018-01-14 MED FILL — ALBUTEROL SUL 2.5 MG/3 ML S: (2.5 MG/3ML | 5 days supply | Qty: 90 | Fill #0

## 2018-02-04 ENCOUNTER — Other Ambulatory Visit: Payer: Self-pay

## 2018-02-04 DIAGNOSIS — J449 Chronic obstructive pulmonary disease, unspecified: Secondary | ICD-10-CM

## 2018-02-04 MED ORDER — ALBUTEROL SULFATE (2.5 MG/3ML) 0.083% IN NEBU: 2.5000 mg | INHALATION_SOLUTION | RESPIRATORY_TRACT | 0 refills | Status: DC | PRN

## 2018-02-04 MED FILL — ALBUTEROL SUL 2.5 MG/3 ML S: (2.5 MG/3ML | 5 days supply | Qty: 90 | Fill #0

## 2018-02-04 NOTE — Telephone Encounter (Signed)
1) Medication(s) Requested (by name): albuterol 2) Pharmacy of Choice:  [facility]

## 2018-02-11 ENCOUNTER — Ambulatory Visit: Payer: Self-pay | Admitting: Family Medicine

## 2018-02-15 ENCOUNTER — Encounter: Payer: Self-pay | Admitting: Family Medicine

## 2018-02-15 ENCOUNTER — Ambulatory Visit: Payer: Self-pay | Attending: Family Medicine | Admitting: Family Medicine

## 2018-02-15 VITALS — BP 120/77 | HR 95 | Temp 98.0°F | Wt 185.0 lb

## 2018-02-15 DIAGNOSIS — F17200 Nicotine dependence, unspecified, uncomplicated: Secondary | ICD-10-CM

## 2018-02-15 DIAGNOSIS — F411 Generalized anxiety disorder: Secondary | ICD-10-CM

## 2018-02-15 DIAGNOSIS — F17210 Nicotine dependence, cigarettes, uncomplicated: Secondary | ICD-10-CM | POA: Insufficient documentation

## 2018-02-15 DIAGNOSIS — F329 Major depressive disorder, single episode, unspecified: Secondary | ICD-10-CM | POA: Insufficient documentation

## 2018-02-15 DIAGNOSIS — Z7951 Long term (current) use of inhaled steroids: Secondary | ICD-10-CM | POA: Insufficient documentation

## 2018-02-15 DIAGNOSIS — Z7982 Long term (current) use of aspirin: Secondary | ICD-10-CM | POA: Insufficient documentation

## 2018-02-15 DIAGNOSIS — Z79899 Other long term (current) drug therapy: Secondary | ICD-10-CM | POA: Insufficient documentation

## 2018-02-15 DIAGNOSIS — J449 Chronic obstructive pulmonary disease, unspecified: Secondary | ICD-10-CM

## 2018-02-15 MED ORDER — BUPROPION HCL ER (SR) 150 MG PO TB12: 150.0000 mg | ORAL_TABLET | Freq: Two times a day (BID) | ORAL | 6 refills | Status: DC

## 2018-02-15 MED ORDER — ALBUTEROL SULFATE HFA 108 (90 BASE) MCG/ACT IN AERS: 1.0000 | INHALATION_SPRAY | Freq: Four times a day (QID) | RESPIRATORY_TRACT | 6 refills | Status: DC | PRN

## 2018-02-15 MED ORDER — BUDESONIDE-FORMOTEROL FUMARATE 160-4.5 MCG/ACT IN AERO: INHALATION_SPRAY | RESPIRATORY_TRACT | 6 refills | Status: DC

## 2018-02-15 MED ORDER — ATORVASTATIN CALCIUM 20 MG PO TABS: 20.0000 mg | ORAL_TABLET | Freq: Every day | ORAL | 3 refills | Status: DC

## 2018-02-15 NOTE — Progress Notes (Signed)
Subjective:  Patient ID: [name] [name], female    DOB: 11-12-1950  Age: 68 y.o. MRN: [id number]  CC: COPD   HPI [name] [name] is a 68 year old female with a history of COPD, anxiety and depression who presents today to establish care with me. She was previously followed by pulmonary but has been unable to afford to follow-up there as she lost her job and this has been a major stressor for her.  Her depression has worsened as a result, she has gained weight and she resumed smoking again and now smokes 10 to 15 cigarettes/day. She denies suicidal ideation or intent and was previously on Wellbutrin which she states worked for her. She has run out of all her inhalers and endorses some dyspnea but denies wheezing.  She would like to get back on her medications again.  Past Medical History:  Diagnosis Date  . Anxiety   . COPD (chronic obstructive pulmonary disease) ([place])   . Diverticulitis   . Migraines     Past Surgical History:  Procedure Laterality Date  . CESAREAN SECTION    . STAPLING OF BLEBS Left 10/31/2014   Procedure: STAPLING OF BLEBS;  Surgeon: Gaye Pollack, MD;  Location: [facility];  Service: Thoracic;  Laterality: Left;  Marland Kitchen VIDEO ASSISTED THORACOSCOPY Left 10/31/2014   Procedure: LEFT VIDEO ASSISTED THORACOSCOPY;  Surgeon: Gaye Pollack, MD;  Location: [facility];  Service: Thoracic;  Laterality: Left;    No Known Allergies   Outpatient Medications Prior to Visit  Medication Sig Dispense Refill  . acetaminophen (TYLENOL) 500 MG tablet Take 1 tablet (500 mg total) by mouth every 6 (six) hours as needed. 30 tablet 0  . albuterol (PROVENTIL) (2.5 MG/3ML) 0.083% nebulizer solution Take 3 mLs (2.5 mg total) by nebulization every 4 (four) hours as needed for wheezing or shortness of breath. 90 mL 0  . aspirin 325 MG tablet Take 325 mg by mouth daily.    . diclofenac sodium (VOLTAREN) 1 % GEL Apply 2 g topically 4 (four) times daily. 100 g 2  . ibuprofen (ADVIL,MOTRIN) 200 MG tablet  Take 400 mg by mouth every 6 (six) hours as needed for headache or moderate pain.    Marland Kitchen umeclidinium bromide (INCRUSE ELLIPTA) 62.5 MCG/INH AEPB Inhale 1 puff into the lungs daily. 14 each 0  . umeclidinium bromide (INCRUSE ELLIPTA) 62.5 MCG/INH AEPB Inhale 1 puff into the lungs daily. 30 each 3  . albuterol (PROVENTIL HFA;VENTOLIN HFA) 108 (90 Base) MCG/ACT inhaler Inhale 1-2 puffs into the lungs every 6 (six) hours as needed for wheezing or shortness of breath. 1 Inhaler 3  . atorvastatin (LIPITOR) 20 MG tablet Take 1 tablet (20 mg total) by mouth daily at 6 PM. 90 tablet 3  . budesonide-formoterol (SYMBICORT) 160-4.5 MCG/ACT inhaler Take 2 puffs first thing in am and then another 2 puffs about 12 hours later. 1 Inhaler 0  . buPROPion (WELLBUTRIN SR) 150 MG 12 hr tablet Take 1 tablet (150 mg total) by mouth 2 (two) times daily. 120 tablet 3  . amoxicillin (AMOXIL) 875 MG tablet Take 1 tablet (875 mg total) by mouth 2 (two) times daily. (Patient not taking: Reported on 02/15/2018) 10 tablet 0  . gabapentin (NEURONTIN) 300 MG capsule Take 1 capsule (300 mg total) by mouth at bedtime. (Patient not taking: Reported on 02/15/2018) 90 capsule 2  . hydrOXYzine (ATARAX/VISTARIL) 10 MG tablet Take 1 tablet (10 mg total) by mouth 3 (three) times daily as needed. (Patient  not taking: Reported on 02/15/2018) 40 tablet 1  . phenol (CHLORASEPTIC) 1.4 % LIQD Use as directed 1 spray in the mouth or throat as needed for throat irritation / pain. (Patient not taking: Reported on 02/15/2018) 236 mL 0  . traZODone (DESYREL) 50 MG tablet Take 0.5-1 tablets (25-50 mg total) by mouth at bedtime as needed for sleep. (Patient not taking: Reported on 02/15/2018) 30 tablet 0   No facility-administered medications prior to visit.     ROS Review of Systems  Constitutional: Negative for activity change, appetite change and fatigue.  HENT: Negative for congestion, sinus pressure and sore throat.   Eyes: Negative for visual  disturbance.  Respiratory: Positive for shortness of breath. Negative for cough, chest tightness and wheezing.   Cardiovascular: Negative for chest pain and palpitations.  Gastrointestinal: Negative for abdominal distention, abdominal pain and constipation.  Endocrine: Negative for polydipsia.  Genitourinary: Negative for dysuria and frequency.  Musculoskeletal: Negative for arthralgias and back pain.  Skin: Negative for rash.  Neurological: Negative for tremors, light-headedness and numbness.  Hematological: Does not bruise/bleed easily.  Psychiatric/Behavioral: Negative for agitation and behavioral problems.    Objective:  BP 120/77   Pulse 95   Temp 98 F (36.7 C) (Oral)   Wt 185 lb (83.9 kg)   SpO2 96%   BMI 30.79 kg/m   BP/Weight 02/15/2018 12/29/2016 31/49/7026  Systolic BP 378 588 502  Diastolic BP 77 72 85  Wt. (Lbs) 185 195.2 191.6  BMI 30.79 32.48 32.89      Physical Exam Constitutional:      Appearance: She is well-developed.  Cardiovascular:     Rate and Rhythm: Normal rate.     Heart sounds: Normal heart sounds. No murmur.  Pulmonary:     Effort: Pulmonary effort is normal.     Breath sounds: Normal breath sounds. No wheezing or rales.  Chest:     Chest wall: No tenderness.  Abdominal:     General: Bowel sounds are normal. There is no distension.     Palpations: Abdomen is soft. There is no mass.     Tenderness: There is no abdominal tenderness.  Musculoskeletal: Normal range of motion.  Neurological:     Mental Status: She is alert and oriented to person, place, and time.      Assessment & Plan:   1. COPD GOLD III No acute exacerbation Advised that smoking cessation will retard progression of condition - albuterol (PROVENTIL HFA;VENTOLIN HFA) 108 (90 Base) MCG/ACT inhaler; Inhale 1-2 puffs into the lungs every 6 (six) hours as needed for wheezing or shortness of breath.  Dispense: 1 Inhaler; Refill: 6 - budesonide-formoterol (SYMBICORT) 160-4.5  MCG/ACT inhaler; Take 2 puffs first thing in am and then another 2 puffs about 12 hours later.  Dispense: 1 Inhaler; Refill: 6 - CMP14+EGFR  2. Anxiety state Refilled bupropion which she has been out of This is currently exacerbated by financial stressors and lack of a job  3. Smoker Currently smoking 10 to 15 cigarettes/day Hopefully initiation of Wellbutrin will be beneficial   Meds ordered this encounter  Medications  . albuterol (PROVENTIL HFA;VENTOLIN HFA) 108 (90 Base) MCG/ACT inhaler    Sig: Inhale 1-2 puffs into the lungs every 6 (six) hours as needed for wheezing or shortness of breath.    Dispense:  1 Inhaler    Refill:  6  . atorvastatin (LIPITOR) 20 MG tablet    Sig: Take 1 tablet (20 mg total) by mouth daily at 6  PM.    Dispense:  90 tablet    Refill:  3  . budesonide-formoterol (SYMBICORT) 160-4.5 MCG/ACT inhaler    Sig: Take 2 puffs first thing in am and then another 2 puffs about 12 hours later.    Dispense:  1 Inhaler    Refill:  6    Order Specific Question:   Lot Number?    Answer:   [id number]    Order Specific Question:   Expiration Date?    Answer:   08/04/2018    Order Specific Question:   Manufacturer?    Answer:   [employer] [[id number]]    Order Specific Question:   Quantity    Answer:   1  . buPROPion (WELLBUTRIN SR) 150 MG 12 hr tablet    Sig: Take 1 tablet (150 mg total) by mouth 2 (two) times daily.    Dispense:  120 tablet    Refill:  6    Follow-up: Return in about 3 months (around 05/17/2018) for follow up of chronic medical conditions.   Charlott Rakes MD

## 2018-02-16 LAB — CMP14+EGFR
A/G RATIO: 1.6 (ref 1.2–2.2)
ALK PHOS: 96 IU/L (ref 39–117)
ALT: 17 IU/L (ref 0–32)
AST: 20 IU/L (ref 0–40)
Albumin: 4.4 g/dL (ref 3.6–4.8)
BUN/Creatinine Ratio: 20 (ref 12–28)
BUN: 15 mg/dL (ref 8–27)
Bilirubin Total: 0.4 mg/dL (ref 0.0–1.2)
CO2: 22 mmol/L (ref 20–29)
Calcium: 10.6 mg/dL — ABNORMAL HIGH (ref 8.7–10.3)
Chloride: 105 mmol/L (ref 96–106)
Creatinine, Ser: 0.74 mg/dL (ref 0.57–1.00)
GFR calc Af Amer: 98 mL/min/{1.73_m2} (ref >59)
GFR calc non Af Amer: 85 mL/min/{1.73_m2} (ref >59)
Globulin, Total: 2.8 g/dL (ref 1.5–4.5)
Glucose: 90 mg/dL (ref 65–99)
POTASSIUM: 4.9 mmol/L (ref 3.5–5.2)
SODIUM: 145 mmol/L — AB (ref 134–144)
Total Protein: 7.2 g/dL (ref 6.0–8.5)

## 2018-02-16 MED FILL — !VENTOLIN HFA INHALER: 108 (90 BAS | 25 days supply | Qty: 18 | Fill #0

## 2018-02-16 MED FILL — SYMBICORT 160-4.5 MCG INH: 160-4.5 | 30 days supply | Qty: 10 | Fill #0

## 2018-02-16 MED FILL — ?ATORVASTATIN 20 MG TABLET: 20 | 30 days supply | Qty: 30 | Fill #0

## 2018-02-16 MED FILL — BUPROPION SR 150 MG TABLET: 150 | 30 days supply | Qty: 60 | Fill #0

## 2018-02-25 ENCOUNTER — Other Ambulatory Visit: Payer: Self-pay | Admitting: Family Medicine

## 2018-02-25 DIAGNOSIS — J449 Chronic obstructive pulmonary disease, unspecified: Secondary | ICD-10-CM

## 2018-02-26 MED FILL — ALBUTEROL SUL 2.5 MG/3 ML S: (2.5 MG/3ML | 5 days supply | Qty: 90 | Fill #0

## 2018-03-04 ENCOUNTER — Telehealth: Payer: Self-pay

## 2018-03-04 NOTE — Telephone Encounter (Signed)
-----   Message from Hoy Register, MD sent at 02/16/2018  2:14 PM EST ----- Labs are stable

## 2018-03-04 NOTE — Telephone Encounter (Signed)
Patient was called via interpreter([id number]) and was informed of lab results.

## 2018-03-16 MED FILL — ?ALBUTEROL SUL 2.5 MG/3 MLS: (2.5 MG/3ML | 5 days supply | Qty: 90 | Fill #1

## 2018-04-08 MED FILL — ?ALBUTEROL SUL 2.5 MG/3 MLS: (2.5 MG/3ML | 5 days supply | Qty: 90 | Fill #2

## 2018-04-26 ENCOUNTER — Other Ambulatory Visit: Payer: Self-pay | Admitting: Family Medicine

## 2018-04-26 DIAGNOSIS — J449 Chronic obstructive pulmonary disease, unspecified: Secondary | ICD-10-CM

## 2018-04-26 MED FILL — BUPROPION SR 150 MG TABLET: 150 | 30 days supply | Qty: 60 | Fill #1

## 2018-04-26 MED FILL — ?ATORVASTATIN 20 MG TABLET: 20 | 90 days supply | Qty: 90 | Fill #1

## 2018-04-27 ENCOUNTER — Telehealth: Payer: Self-pay | Admitting: Family Medicine

## 2018-04-27 MED FILL — ?ALBUTEROL SUL 2.5 MG/3 MLS: (2.5 MG/3ML | 5 days supply | Qty: 90 | Fill #0

## 2018-04-27 NOTE — Telephone Encounter (Signed)
Pt called in to verify address for medication delivery is [address] [place] [address]

## 2018-04-27 NOTE — Telephone Encounter (Signed)
Verified address in our Rx30 system.

## 2018-05-19 MED FILL — ?ALBUTEROL SUL 2.5 MG/3 MLS: (2.5 MG/3ML | 5 days supply | Qty: 90 | Fill #1

## 2018-05-20 MED FILL — SYMBICORT 160-4.5 MCG INH: 160-4.5 | 30 days supply | Qty: 10 | Fill #1

## 2018-06-12 MED FILL — ?ALBUTEROL SUL 2.5 MG/3 MLS: (2.5 MG/3ML | 5 days supply | Qty: 90 | Fill #2

## 2018-06-14 ENCOUNTER — Other Ambulatory Visit: Payer: Self-pay | Admitting: Family Medicine

## 2018-06-14 DIAGNOSIS — J449 Chronic obstructive pulmonary disease, unspecified: Secondary | ICD-10-CM

## 2018-06-29 MED FILL — BUPROPION SR 150 MG TABLET: 150 | 30 days supply | Qty: 60 | Fill #2

## 2018-06-30 MED FILL — ALBUTEROL SUL 2.5 MG/3 ML S: (2.5 MG/3ML | 5 days supply | Qty: 90 | Fill #0

## 2018-07-23 MED FILL — ALBUTEROL SUL 2.5 MG/3 ML S: (2.5 MG/3ML | 5 days supply | Qty: 90 | Fill #1

## 2018-08-17 MED FILL — ALBUTEROL SUL 2.5 MG/3 ML S: (2.5 MG/3ML | 5 days supply | Qty: 90 | Fill #2

## 2018-08-19 MED FILL — ?ATORVASTATIN 20 MG TABLET: 20 | 90 days supply | Qty: 90 | Fill #2

## 2018-08-23 MED FILL — !VENTOLIN HFA INHALER: 108 (90 BAS | 25 days supply | Qty: 18 | Fill #1

## 2018-09-01 ENCOUNTER — Other Ambulatory Visit: Payer: Self-pay | Admitting: Family Medicine

## 2018-09-01 DIAGNOSIS — J449 Chronic obstructive pulmonary disease, unspecified: Secondary | ICD-10-CM

## 2018-09-01 MED FILL — ALBUTEROL SUL 2.5 MG/3 ML S: (2.5 MG/3ML | 5 days supply | Qty: 90 | Fill #0

## 2018-09-06 MED FILL — ALBUTEROL SUL 2.5 MG/3 ML S: (2.5 MG/3ML | 5 days supply | Qty: 90 | Fill #1

## 2018-09-17 MED FILL — BUPROPION SR 150 MG TABLET: 150 | 30 days supply | Qty: 60 | Fill #3

## 2018-09-21 MED FILL — ALBUTEROL SUL 2.5 MG/3 ML S: (2.5 MG/3ML | 5 days supply | Qty: 90 | Fill #1

## 2018-09-21 MED FILL — $Symbicort 160-4.5mcg/act: 160-4.5 | 90 days supply | Qty: 3 | Fill #2

## 2018-10-04 MED FILL — ALBUTEROL SUL 2.5 MG/3 ML S: (2.5 MG/3ML | 5 days supply | Qty: 90 | Fill #2

## 2018-10-12 ENCOUNTER — Telehealth: Payer: Self-pay

## 2018-10-12 ENCOUNTER — Other Ambulatory Visit: Payer: Self-pay

## 2018-10-12 MED ORDER — MISC. DEVICES MISC: 0 refills | Status: DC

## 2018-10-13 NOTE — Telephone Encounter (Signed)
Error

## 2018-10-19 ENCOUNTER — Other Ambulatory Visit: Payer: Self-pay | Admitting: Family Medicine

## 2018-10-19 DIAGNOSIS — J449 Chronic obstructive pulmonary disease, unspecified: Secondary | ICD-10-CM

## 2018-10-19 MED FILL — ALBUTEROL SUL 2.5 MG/3 ML S: (2.5 MG/3ML | 5 days supply | Qty: 90 | Fill #0

## 2018-10-20 MED FILL — !VENTOLIN HFA INHALER: 108 (90 BAS | 25 days supply | Qty: 18 | Fill #2

## 2018-10-29 MED FILL — ALBUTEROL SULFATE HFA 108 (: 108 (90 BAS | 25 days supply | Qty: 18 | Fill #3

## 2018-11-05 ENCOUNTER — Other Ambulatory Visit: Payer: Self-pay | Admitting: Family Medicine

## 2018-11-05 DIAGNOSIS — J449 Chronic obstructive pulmonary disease, unspecified: Secondary | ICD-10-CM

## 2018-11-05 MED FILL — ALBUTEROL SUL 2.5 MG/3 ML S: (2.5 MG/3ML | 5 days supply | Qty: 90 | Fill #0

## 2018-11-26 ENCOUNTER — Other Ambulatory Visit: Payer: Self-pay | Admitting: Family Medicine

## 2018-11-26 DIAGNOSIS — J449 Chronic obstructive pulmonary disease, unspecified: Secondary | ICD-10-CM

## 2018-11-29 MED FILL — ALBUTEROL SUL 2.5 MG/3 ML S: (2.5 MG/3ML | 5 days supply | Qty: 90 | Fill #0

## 2018-12-20 ENCOUNTER — Other Ambulatory Visit: Payer: Self-pay | Admitting: Family Medicine

## 2018-12-20 DIAGNOSIS — J449 Chronic obstructive pulmonary disease, unspecified: Secondary | ICD-10-CM

## 2018-12-24 ENCOUNTER — Encounter: Payer: Self-pay | Admitting: Internal Medicine

## 2018-12-24 ENCOUNTER — Other Ambulatory Visit: Payer: Self-pay

## 2018-12-24 ENCOUNTER — Ambulatory Visit (INDEPENDENT_AMBULATORY_CARE_PROVIDER_SITE_OTHER): Payer: Medicare Other | Admitting: Internal Medicine

## 2018-12-24 ENCOUNTER — Other Ambulatory Visit: Payer: Self-pay | Admitting: Family Medicine

## 2018-12-24 DIAGNOSIS — J449 Chronic obstructive pulmonary disease, unspecified: Secondary | ICD-10-CM

## 2018-12-24 DIAGNOSIS — F17210 Nicotine dependence, cigarettes, uncomplicated: Secondary | ICD-10-CM

## 2018-12-24 MED ORDER — HYDROXYZINE HCL 10 MG PO TABS: 10.0000 mg | ORAL_TABLET | ORAL | 1 refills | Status: DC | PRN

## 2018-12-24 MED FILL — ALBUTEROL SUL 2.5 MG/3 ML S: (2.5 MG/3ML | 5 days supply | Qty: 90 | Fill #0

## 2018-12-24 NOTE — Progress Notes (Signed)
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 PULMONARY CONSULT    Spontaneous Left Pneumothorax. Pt states that she has increased dyspnea with exertion. Pt c/o of occasional wet cough with yellow to clear mucus and chest pain with breathing. Pt denies any wheeze.   still way overusing saba at baseline mostly in neb form with doe x across the room and not using symbicort or pain meds correctly (see a/p) rec Plan A = Automatic = Symbiocrt 160 one twice daily and Incruse one click each am  Plan B = Backup - Only use your albuterol (Yellow/proventil) as a rescue medication Plan C = Crisis/ contingency - nebulizer, ok use with albuterol (or combination until you use it up)  every 4 hours if plan B is not working  For pain > tramadol 50 mg up to 2 every 4 hours  For nerves> alprazolam 0.5 mg up to one every 4 hours      11/24/2014  f/u ov/Reinhart Saulters re: copd/ pfts on symb/ incruse still using saba  Chief Complaint  Patient presents with  . Follow-up    pt following for COPD: pt states she is doing pretty well, she is still having pain on her left side, but it [place] much better.  pt states her breathing has been very well. pt c/o increase SOB when she exerts herself. no c/o chest tightness. pt needs refills on meds.    doe = MMRC1 = can walk nl pace, flat grade, can't hurry or go uphills or steps s sob  rec Please remember to go to the lab  department downstairs for your tests - we will call you with the results when they are available. Symbicort 160 Take1 puffs first thing in am and then another1 puffs about 12 hours later.  Goal is not to need to rescue albuterol  much at all      01/31/2015  f/u ov/Tifany Hirsch re: COPD III/ symbicort 160 2bid/ incruse and prn saba  Chief Complaint  Patient presents with  . Follow-up    Pt states that breathing is unchanged since last visit. Pt states that she is under a lot of stress right now which has made her breathing worse   2x daily neb but not noct/ no hfa use  Pain L cw same as post op never improved, very sensitive chest wall / not resp to tramadol so far rec Please see patient coordinator before you leave today  to schedule referral to Internal Medicine/ Dr Drue Novel  Zostrix cream 4 x daily (over the counter) - as improves can reduce to bedtime only  Plan A = Automatic = Symbiocrt 160 one twice daily and Incruse one click each am  Plan B = Backup - Only use your albuterol (Yellow/proventil) as a rescue medication Plan C = Crisis/ contingency - nebulizer, ok use with albuterol (or combination until you use it up)  every 4  hours if plan B is not working    08/29/2016  f/u ov/Cornel Werber re:   COPD GOLD III/ quit smoking  02/2016 Chief Complaint  Patient presents with  . Follow-up    l/s 01/2015- pt stopped smoking X6 mos ago, noted weight gain, since then c/o increased SOB, sinus congestion, PND, hoarseness.    walking 30 min s stopping sev times a week better if use albturol first despite using symbicort 160 2bid  Does not recall response to prednisone or incruse  Easily confused with details of care / access to meds issues  Doe = MMRC2 = can't walk a nl pace on a flat grade s sob but does fine slow and flat  rec Please see patient coordinator before you leave today  to schedule referral to Internal Medicine/ Dr Larose Kells  Zostrix cream 4 x daily (over the counter) - as improves can reduce to  bedtime only  Plan A = Automatic = Symbiocrt 160 one twice daily and Incruse one click each am  Plan B = Backup - Only use your albuterol (Yellow/proventil) as a rescue medication Plan C = Crisis/ contingency - nebulizer, ok use with albuterol (or combination until you use it up)  every 4 hours if plan B is not working  Please schedule a follow up visit in 3 months but call sooner if needed - once establish with Dr Larose Kells follow up here can be as needed  Late add: confused as to whether she uses the symb one bid vs 2 bid will leave things as they are right now due to communication barriers but ideally the dose should be 2 pffs every 12 hours unless can't tolerate for some reason     12/24/2018  f/u ov/Derryl Uher re: re-establish re COPD GOLD III/ resumed smoking / problems with access to meds  Chief Complaint  Patient presents with  . Consult    Patient is here for COPD. Patient has started smoking again since the virus started and want's help to stop.   Dyspnea:  Has a 15 min course around house s stopping Cough: none  Sleeping: ok flat s respiratory symptoms  SABA use: avg twice daily  02: none    No obvious day to day or daytime variability or assoc excess/ purulent sputum or mucus plugs or hemoptysis or cp or chest tightness, subjective wheeze or overt sinus or hb symptoms.   Sleeping  without nocturnal  or early am exacerbation  of respiratory  c/o's or need for noct saba. Also denies any obvious fluctuation of symptoms with weather or environmental changes or other aggravating or alleviating factors except as outlined above   No unusual exposure hx or h/o childhood pna/ asthma or knowledge of premature birth.  Current Allergies, Complete Past Medical History, Past Surgical History, Family History, and Social History were reviewed in [employer] record.  ROS  The following are not active complaints unless bolded Hoarseness, sore throat, dysphagia, dental problems,  itching, sneezing,  nasal congestion or discharge of excess mucus or purulent secretions, ear ache,   fever, chills, sweats, unintended wt loss or wt gain, classically pleuritic or exertional cp,  orthopnea pnd or arm/hand swelling  or leg swelling, presyncope, palpitations, abdominal pain, anorexia, nausea, vomiting, diarrhea  or change in bowel habits or change in bladder habits, change in stools or change in urine, dysuria, hematuria,  rash, arthralgias, visual complaints, headache, numbness, weakness or ataxia or problems with walking or coordination,  change in mood or  memory.        Current Meds  Medication Sig  . acetaminophen (TYLENOL) 500 MG tablet Take 1 tablet (500 mg total) by mouth every 6 (six) hours as needed.  Marland Kitchen albuterol (PROVENTIL HFA;VENTOLIN HFA) 108 (90 Base) MCG/ACT inhaler Inhale 1-2 puffs into the lungs every 6 (six) hours as needed for wheezing or shortness of breath.  Marland Kitchen albuterol (PROVENTIL) (2.5 MG/3ML) 0.083% nebulizer solution USE 1 VIAL BY NEBULIZATION EVERY 4 HOURS AS NEEDED FOR WHEEZING OR SHORTNESS OF BREATH. MUST HAVE OFFICE VISIT FOR REFILLS. LAST FILL!  Marland Kitchen aspirin 325 MG tablet Take 325 mg by mouth daily.  Marland Kitchen atorvastatin (LIPITOR) 20 MG tablet Take 1 tablet (20 mg total) by mouth daily at 6 PM.  . budesonide-formoterol (SYMBICORT) 160-4.5 MCG/ACT inhaler Take 2 puffs first thing in am and then another 2 puffs about 12 hours later.  Marland Kitchen buPROPion (WELLBUTRIN SR) 150 MG 12 hr tablet Take 1 tablet (150 mg total) by mouth 2 (two) times daily.  Marland Kitchen ibuprofen (ADVIL,MOTRIN) 200 MG tablet Take 400 mg by mouth every 6 (six) hours as needed for headache or moderate pain.  . Misc. Devices MISC Nebulizer DX: COPD J44.9  . umeclidinium bromide (INCRUSE ELLIPTA) 62.5 MCG/INH AEPB Inhale 1 puff into the lungs daily.               Objective:   Physical Exam    amb mildly obese latina nad   12/24/2018    180  11/24/2014      177 > 01/31/2015 180 >  08/29/2016    190      11/10/14 177 lb 3.2 oz (80.377 kg)  11/08/14 178 lb 5 oz (80.882 kg)  10/26/14 177 lb 1.6 oz (80.332 kg)    BP 124/84 (BP Location: Left Arm, Patient Position: Sitting, Cuff Size: Normal)   Pulse 99   Temp (!) 97.2 F (36.2 C) (Temporal)   Wt 180 lb 12.8 oz (82 kg)   SpO2 94% Comment: on RA  BMI 30.09 kg/m      HEENT : pt wearing mask not removed for exam due to covid - 19 concerns.   NECK :  without JVD/Nodes/TM/ nl carotid upstrokes bilaterally   LUNGS: no acc muscle use,  Min barrel  contour chest wall with bilateral insp/exp rhonchi and  without cough on insp or exp maneuvers and min hyperresonant  to  percussion bilaterally     CV:  RRR  no s3 or murmur or increase in P2, and no edema   ABD:  soft and nontender with pos end  insp Hoover's  in the supine position. No bruits or organomegaly appreciated, bowel sounds nl  MS:   Nl gait/  ext warm without deformities, calf tenderness, cyanosis or clubbing No obvious joint restrictions   SKIN: warm and dry without lesions    NEURO:  alert, approp, nl sensorium with  no motor or cerebellar deficits apparent.         Declines cxr 12/24/2018     Assessment & Plan:

## 2018-12-24 NOTE — Patient Instructions (Addendum)
Plan A = Automatic = Symbicort 160 Take 2 puffs first thing in am and then another 2 puffs about 12 hours later.  And when available Incruse each am  (an option that has both is called Trelegy)    Plan B = Backup - Only use your albuterol (Yellow/proventil) as a rescue medication to be used if you can't catch your breath by resting or doing a relaxed purse lip breathing pattern.  - The less you use it, the better it will work when you need it. - Ok to use up to 2 puffs  every 4 hours if you must but call for immediate appointment if use goes up over your usual need - Don't leave home without it !!  (think of it like the spare tire for your car)    Plan C = Crisis/ contingency - nebulizer, ok use with albuterol (or combination until you use it up)  every 4 hours if plan B is not working   The key is to stop smoking completely before smoking completely stops you!    Please schedule a follow up visit in 6 months but call sooner if needed.    Plan A = Automtico = Symbicort 160 Tome 2 inhalaciones a primera hora de la maana y luego otras 2 inhalaciones aproximadamente 12 horas despus. Y cuando est disponible Incruse cada maana (una opcin que tiene ambos se llama Trelegy)  Plan B = Respaldo - Solo use su albuterol (amarillo / proventil) como medicamento de rescate para usar si no puede recuperar el aliento descansando o haciendo un patrn de respiracin relajado con los labios en bolsa. - Cuanto menos lo use, mejor funcionar cuando lo necesite. - Est bien usar hasta 2 inhalaciones cada 4 horas si es necesario, pero llame para una cita inmediata si el uso supera su necesidad habitual - No salgas de casa sin l !! (piense en ello como la llanta de repuesto de su automvil)   Plan C = Crisis / contingencia - nebulizador, ok selo con albuterol (o una combinacin hasta que se agote) cada 4 horas si el plan B no funciona  La clave es dejar de fumar por completo antes de que fumar  lo detenga por completo!    Programe una visita de seguimiento en 6 meses, pero llame antes si es necesario

## 2018-12-25 ENCOUNTER — Encounter: Payer: Self-pay | Admitting: Internal Medicine

## 2018-12-25 NOTE — Assessment & Plan Note (Signed)
4-5 min discussion re active cigarette smoking in addition to office E&M  Ask about tobacco use:   Ongoing  Advise quitting   I emphasized that although we never turn away smokers from the pulmonary clinic, we do ask that they understand that the recommendations that we make  won't work nearly as well in the presence of continued cigarette exposure. In fact, we may very well  reach a point where we can't promise to help the patient if he/she can't quit smoking. (We can and will promise to try to help, we just can't promise what we recommend will really work)  Assess willingness:  Trying wellbutrin, needs hydroxyzine for prn anxiety /sleep > given limited supply and referred back to pcp Assist in quit attempt:  Per PCP   Arrange follow up:   Follow up per Primary Care planned

## 2018-12-25 NOTE — Assessment & Plan Note (Addendum)
Active smoker  - 11/10/2014  new maint rx = symb 160/incruse sample - Alpha one AT 11/24/2014 >  MM/ nl levels - 01/31/2015  extensive coaching HFA effectiveness =    90%  - spirometry 01/31/2015  FEV1  0.85 (37%) ratio 34  - Spirometry 08/29/2016  FEV1 0.71 (30%)  Ratio 36  > added back LAMA = incruse samples  - 12/24/2018  After extensive coaching inhaler device,  effectiveness =    75%     Group D in terms of symptom/risk and laba/lama/ICS  therefore appropriate rx at this point >>>  Continue symb/incruse vs trelegy depending on insurance  Advised:  formulary restrictions will be an ongoing challenge for the forseable future and I would be happy to pick an alternative if the pt will first  provide me a list of them -  pt  will need to return here for training for any new device that is required eg dpi vs hfa vs respimat.    In the meantime we can always provide samples so that the patient never runs out of any needed respiratory medications.   I had an extended discussion with the patient reviewing all relevant studies completed to date and  lasting 15 to 20 minutes of a 25 minute visit    I performed detailed device teaching using a teach back method which extended face to face time for this visit (see above)  Each maintenance medication was reviewed in detail including emphasizing most importantly the difference between maintenance and prns and under what circumstances the prns are to be triggered using an action plan format that is not reflected in the computer generated alphabetically organized AVS which I have not found useful in most complex patients, especially with respiratory illnesses  Please see AVS for specific instructions unique to this visit that I personally wrote and verbalized to the the pt in detail and then reviewed with pt  by my nurse highlighting any  changes in therapy recommended at today's visit to their plan of care.

## 2019-01-03 MED FILL — BUPROPION SR 150 MG TABLET: 150 | 30 days supply | Qty: 60 | Fill #4

## 2019-01-11 ENCOUNTER — Other Ambulatory Visit: Payer: Self-pay | Admitting: Family Medicine

## 2019-01-11 DIAGNOSIS — J449 Chronic obstructive pulmonary disease, unspecified: Secondary | ICD-10-CM

## 2019-01-11 MED FILL — ALBUTEROL SUL 2.5 MG/3 ML S: (2.5 MG/3ML | 5 days supply | Qty: 90 | Fill #0

## 2019-01-31 ENCOUNTER — Other Ambulatory Visit: Payer: Self-pay | Admitting: Family Medicine

## 2019-01-31 DIAGNOSIS — J449 Chronic obstructive pulmonary disease, unspecified: Secondary | ICD-10-CM

## 2019-01-31 MED FILL — ALBUTEROL SUL 2.5 MG/3 ML S: (2.5 MG/3ML | 5 days supply | Qty: 90 | Fill #0

## 2019-02-07 MED FILL — ATORVASTATIN CALCIUM 20 MG: 20 | 30 days supply | Qty: 30 | Fill #3

## 2019-02-24 ENCOUNTER — Other Ambulatory Visit: Payer: Self-pay | Admitting: Family Medicine

## 2019-02-24 DIAGNOSIS — J449 Chronic obstructive pulmonary disease, unspecified: Secondary | ICD-10-CM

## 2019-02-28 ENCOUNTER — Telehealth: Payer: Self-pay | Admitting: Family Medicine

## 2019-02-28 ENCOUNTER — Other Ambulatory Visit: Payer: Self-pay

## 2019-02-28 ENCOUNTER — Ambulatory Visit: Payer: Medicare Other | Attending: Family | Admitting: Nurse Practitioner

## 2019-02-28 ENCOUNTER — Encounter: Payer: Self-pay | Admitting: Nurse Practitioner

## 2019-02-28 DIAGNOSIS — Z1211 Encounter for screening for malignant neoplasm of colon: Secondary | ICD-10-CM

## 2019-02-28 DIAGNOSIS — Z780 Asymptomatic menopausal state: Secondary | ICD-10-CM | POA: Diagnosis not present

## 2019-02-28 DIAGNOSIS — J449 Chronic obstructive pulmonary disease, unspecified: Secondary | ICD-10-CM | POA: Diagnosis not present

## 2019-02-28 DIAGNOSIS — F17210 Nicotine dependence, cigarettes, uncomplicated: Secondary | ICD-10-CM

## 2019-02-28 DIAGNOSIS — Z13820 Encounter for screening for osteoporosis: Secondary | ICD-10-CM

## 2019-02-28 DIAGNOSIS — Z1231 Encounter for screening mammogram for malignant neoplasm of breast: Secondary | ICD-10-CM

## 2019-02-28 MED ORDER — ALBUTEROL SULFATE (2.5 MG/3ML) 0.083% IN NEBU: INHALATION_SOLUTION | RESPIRATORY_TRACT | 0 refills | Status: DC

## 2019-02-28 MED ORDER — BUPROPION HCL ER (SR) 150 MG PO TB12: 150.0000 mg | ORAL_TABLET | Freq: Two times a day (BID) | ORAL | 6 refills | Status: DC

## 2019-02-28 MED ORDER — ALBUTEROL SULFATE HFA 108 (90 BASE) MCG/ACT IN AERS: 1.0000 | INHALATION_SPRAY | Freq: Four times a day (QID) | RESPIRATORY_TRACT | 1 refills | Status: DC | PRN

## 2019-02-28 MED FILL — BUPROPION SR 150 MG TABLET: 150 | 30 days supply | Qty: 60 | Fill #0

## 2019-02-28 MED FILL — ALBUTEROL SULFATE HFA 108 (: 108 (90 BAS | 25 days supply | Qty: 18 | Fill #0

## 2019-02-28 MED FILL — ALBUTEROL SUL 2.5 MG/3 ML S: (2.5 MG/3ML | 5 days supply | Qty: 90 | Fill #0

## 2019-02-28 NOTE — Progress Notes (Signed)
Virtual Visit via Telephone Note Due to national recommendations of social distancing due to [place] 19, telehealth visit is felt to be most appropriate for this patient at this time.  I discussed the limitations, risks, security and privacy concerns of performing an evaluation and management service by telephone and the availability of in person appointments. I also discussed with the patient that there may be a patient responsible charge related to this service. The patient expressed understanding and agreed to proceed.    I connected with [name] [name] on 02/28/19  at   9:10 AM EST  EDT by telephone and verified that I am speaking with the correct person using two identifiers.   Consent I discussed the limitations, risks, security and privacy concerns of performing an evaluation and management service by telephone and the availability of in person appointments. I also discussed with the patient that there may be a patient responsible charge related to this service. The patient expressed understanding and agreed to proceed.   Location of Patient: Private Residence   Location of Provider: [facility] [place] [facility]    Persons participating in Telemedicine visit: [name] Rankins FNP-BC [place] ID# [phone]   History of Present Illness: Telemedicine visit for: Medication Refill  COPD Well controlled. She denies worsening cough, shortness of breath or wheezing.  She is not using the symbicort inhaler as prescribed. Only one puff twice a day instead of 2 puffs twice a day. I instructed her that she needs to use the symbicort as prescribed to help prevent excessive use of her albuterol inhaler and her nebulizers. She verbalized understanding  Tobacco Dependence She is requesting a refill of wellbutrin for continued smoking cessation. She stopped smoking cigarettes December.   Past Medical History:  Diagnosis Date  .  Anxiety   . COPD (chronic obstructive pulmonary disease) ([place])   . Diverticulitis   . Migraines     Past Surgical History:  Procedure Laterality Date  . CESAREAN SECTION    . STAPLING OF BLEBS Left 10/31/2014   Procedure: STAPLING OF BLEBS;  Surgeon: Gaye Pollack, MD;  Location: [facility];  Service: Thoracic;  Laterality: Left;  Marland Kitchen VIDEO ASSISTED THORACOSCOPY Left 10/31/2014   Procedure: LEFT VIDEO ASSISTED THORACOSCOPY;  Surgeon: Gaye Pollack, MD;  [facility];  Service: Thoracic;  Laterality: Left;    Family History  Problem Relation Age of Onset  . Lung cancer Father   . Lung cancer Brother        type?  . Colon cancer Neg Hx   . Breast cancer Neg Hx   . CAD Neg Hx   . Diabetes Neg Hx     Social History   Socioeconomic History  . Marital status: Divorced    Spouse name: Not on file  . Number of children: 1  . Years of education: Some College  . Highest education level: Not on file  Occupational History  . Occupation: not working currently  Tobacco Use  . Smoking status: Former Smoker    Packs/day: 5.00    Types: Cigarettes    Start date: 04/23/2018  . Smokeless tobacco: Never Used  . Tobacco comment: started age in her late teens; +/- 1 ppd  Substance and Sexual Activity  . Alcohol use: No    Alcohol/week: 0.0 standard drinks  . Drug use: No  . Sexual activity: Not on file  Other Topics Concern  . Not on file  Social History Narrative   Lives with son, [place]   Caffeine use: Coffee- 4-5 cups per day   From [place] , father from [place]   Living in [place] since the 90s   Household- pt and son   Social Determinants of Corporate investment banker Strain:   . Difficulty of Paying Living Expenses: Not on file  Food Insecurity:   . Worried About Programme researcher, broadcasting/film/video in the Last Year: Not on file  . Ran Out of Food in the Last Year: Not on file  Transportation Needs:   . Lack of Transportation (Medical): Not on file  . Lack of Transportation (Non-Medical): Not on  file  Physical Activity:   . Days of Exercise per Week: Not on file  . Minutes of Exercise per Session: Not on file  Stress:   . Feeling of Stress : Not on file  Social Connections:   . Frequency of Communication with Friends and Family: Not on file  . Frequency of Social Gatherings with Friends and Family: Not on file  . Attends Religious Services: Not on file  . Active Member of Clubs or Organizations: Not on file  . Attends Banker Meetings: Not on file  . Marital Status: Not on file     Observations/Objective: Awake, alert and oriented x 3   Review of Systems  Constitutional: Negative for fever, malaise/fatigue and weight loss.  HENT: Negative.  Negative for nosebleeds.   Eyes: Negative.  Negative for blurred vision, double vision and photophobia.  Respiratory: Negative.  Negative for cough and shortness of breath.   Cardiovascular: Negative.  Negative for chest pain, palpitations and leg swelling.  Gastrointestinal: Negative.  Negative for heartburn, nausea and vomiting.  Musculoskeletal: Negative.  Negative for myalgias.  Neurological: Negative.  Negative for dizziness, focal weakness, seizures and headaches.  Psychiatric/Behavioral: Negative.  Negative for suicidal ideas.    Assessment and Plan: [name] was seen today for medication refill.  Diagnoses and all orders for this visit:  COPD GOLD III -     albuterol (VENTOLIN HFA) 108 (90 Base) MCG/ACT inhaler; Inhale 1-2 puffs into the lungs every 6 (six) hours as needed for wheezing or shortness of breath. -     albuterol (PROVENTIL) (2.5 MG/3ML) 0.083% nebulizer solution; USE 1 VIAL BY NEBULIZATION EVERY 4 HOURS AS NEEDED FOR WHEEZING OR SHORTNESS OF BREATH. MUST HAVE OFFICE VISIT FOR REFILLS. LAST FILL!  Cigarette smoker -     buPROPion (WELLBUTRIN SR) 150 MG 12 hr tablet; Take 1 tablet (150 mg total) by mouth 2 (two) times daily.     Follow Up Instructions Return in about 8 weeks (around 04/27/2019) for  office visit and labs.     I discussed the assessment and treatment plan with the patient. The patient was provided an opportunity to ask questions and all were answered. The patient agreed with the plan and demonstrated an understanding of the instructions.   The patient was advised to call back or seek an in-person evaluation if the symptoms worsen or if the condition fails to improve as anticipated.  I provided 15 minutes of non-face-to-face time during this encounter including median intraservice time, reviewing previous notes, labs, imaging, medications and explaining diagnosis and management.  Claiborne Rigg, FNP-BC

## 2019-02-28 NOTE — Telephone Encounter (Signed)
1) Medication(s) Requested (by name): albuterol (PROVENTIL HFA;VENTOLIN HFA) 108 (90 Base) MCG/ACT inhaler [[id number]]   2) Pharmacy of Choice:  [facility] - [place], [place] - [address]  [address] [place], [place] [address]    Approved medications will be sent to pharmacy, we will reach out to you if there is an issue.  Requests made after 3pm may not be addressed until following business day!

## 2019-02-28 NOTE — Telephone Encounter (Signed)
Pt. Had a visit with PCP today regarding medication refills.

## 2019-03-03 ENCOUNTER — Ambulatory Visit: Payer: Medicare Other | Admitting: Family

## 2019-03-22 ENCOUNTER — Telehealth: Payer: Self-pay | Admitting: Internal Medicine

## 2019-03-22 DIAGNOSIS — J449 Chronic obstructive pulmonary disease, unspecified: Secondary | ICD-10-CM

## 2019-03-22 NOTE — Telephone Encounter (Signed)
LMTCB x1 for pt's son, Raphel.

## 2019-03-23 ENCOUNTER — Other Ambulatory Visit: Payer: Self-pay | Admitting: Nurse Practitioner

## 2019-03-23 DIAGNOSIS — J449 Chronic obstructive pulmonary disease, unspecified: Secondary | ICD-10-CM

## 2019-03-23 MED ORDER — INCRUSE ELLIPTA 62.5 MCG/INH IN AEPB: 1.0000 | INHALATION_SPRAY | Freq: Every day | RESPIRATORY_TRACT | 6 refills | Status: DC

## 2019-03-23 MED ORDER — BUDESONIDE-FORMOTEROL FUMARATE 160-4.5 MCG/ACT IN AERO: INHALATION_SPRAY | RESPIRATORY_TRACT | 6 refills | Status: DC

## 2019-03-23 MED FILL — ALBUTEROL SUL 2.5 MG/3 ML S: (2.5 MG/3ML | 5 days supply | Qty: 90 | Fill #0

## 2019-03-23 NOTE — Telephone Encounter (Signed)
Patient last office visit 12/24/18 with Dr. Sherene Sires.  Called patient son and confirmed message received.  He stated he will pick up the prescriptions tomorrow.  Prescriptions sent with refills. Nothing further needed at this time.

## 2019-03-23 NOTE — Telephone Encounter (Signed)
Pt's son calling to check on refill status - CB# [phone]

## 2019-03-29 ENCOUNTER — Telehealth: Payer: Self-pay | Admitting: Internal Medicine

## 2019-03-29 NOTE — Telephone Encounter (Signed)
Called and spoke with pt's son [name] [name] letting him know that we are no longer getting samples of Symbicort. Asked if he needed an Rx to be sent to pharmacy for pt and he said that pt did have an Rx with refills but it would be a couple weeks before she will be able to get the Rx. Raphael then asked if we had samples of Incruse and I stated to him that we did not.  [name] [name] wanted to know if we had any alternatives for inhaler for pt. Dr. Sherene Sires, please advise on this.

## 2019-03-30 MED ORDER — BREZTRI AEROSPHERE 160-9-4.8 MCG/ACT IN AERO: 2.0000 | INHALATION_SPRAY | Freq: Two times a day (BID) | RESPIRATORY_TRACT | 0 refills | Status: DC

## 2019-03-30 NOTE — Telephone Encounter (Signed)
Spoke with the pt's son and notified of recs per MW  Samples up front and scheduled appt with Beth for 04/13/19 and advised bring formulary in hand

## 2019-03-30 NOTE — Telephone Encounter (Signed)
breztri 2bid = symbicort and incruse so ok to give one of these but ov with np to regroup before this runs out/ formulary in hand

## 2019-04-04 ENCOUNTER — Other Ambulatory Visit: Payer: Self-pay | Admitting: Family Medicine

## 2019-04-05 MED FILL — ATORVASTATIN CALCIUM 20 MG: 20 | 30 days supply | Qty: 30 | Fill #0

## 2019-04-11 ENCOUNTER — Other Ambulatory Visit: Payer: Self-pay | Admitting: Nurse Practitioner

## 2019-04-11 DIAGNOSIS — J449 Chronic obstructive pulmonary disease, unspecified: Secondary | ICD-10-CM

## 2019-04-12 MED FILL — ALBUTEROL SUL 2.5 MG/3 ML S: (2.5 MG/3ML | 25 days supply | Qty: 90 | Fill #0

## 2019-04-13 ENCOUNTER — Ambulatory Visit: Payer: Medicare Other | Admitting: Primary Care

## 2019-04-27 ENCOUNTER — Ambulatory Visit: Payer: Medicare Other | Attending: Family Medicine | Admitting: Family Medicine

## 2019-04-27 ENCOUNTER — Encounter: Payer: Self-pay | Admitting: Family Medicine

## 2019-04-27 ENCOUNTER — Other Ambulatory Visit: Payer: Self-pay

## 2019-04-27 VITALS — BP 119/80 | HR 87 | Ht 65.0 in | Wt 181.4 lb

## 2019-04-27 DIAGNOSIS — J449 Chronic obstructive pulmonary disease, unspecified: Secondary | ICD-10-CM | POA: Diagnosis not present

## 2019-04-27 DIAGNOSIS — Z1211 Encounter for screening for malignant neoplasm of colon: Secondary | ICD-10-CM

## 2019-04-27 DIAGNOSIS — F17210 Nicotine dependence, cigarettes, uncomplicated: Secondary | ICD-10-CM | POA: Diagnosis not present

## 2019-04-27 DIAGNOSIS — Z634 Disappearance and death of family member: Secondary | ICD-10-CM | POA: Diagnosis not present

## 2019-04-27 DIAGNOSIS — J302 Other seasonal allergic rhinitis: Secondary | ICD-10-CM

## 2019-04-27 MED ORDER — CETIRIZINE HCL 10 MG PO TABS: 10.0000 mg | ORAL_TABLET | Freq: Every day | ORAL | 1 refills | Status: DC

## 2019-04-27 MED ORDER — FLUTICASONE PROPIONATE 50 MCG/ACT NA SUSP: 2.0000 | Freq: Every day | NASAL | 1 refills | Status: DC

## 2019-04-27 MED FILL — FLUTICASONE PROP 50 MCG SPR: 50 | 30 days supply | Qty: 16 | Fill #0

## 2019-04-27 NOTE — Progress Notes (Signed)
States that she is feeling very depressed due to her sister passing last month.  Having trouble with allergies.

## 2019-04-27 NOTE — Progress Notes (Signed)
Established Patient Office Visit  Subjective:  Patient ID: [name] [name], female    DOB: [birth year]  Age: 68 y.o. MRN: [id number]  CC:  Chief Complaint  Patient presents with  . COPD    HPI [name] [name] is a 69 year old female with a history of COPD, anxiety, and depression who presents today for a follow up visit.  She is followed by Dr. Sherene Sires at [facility] and her next appointment is in May.  Endorses compliance with inhaled medications but is alternating Breztri and Symbicort depending on which she has at the time.  She has not been able to afford her Incruse Ellipta but her insurance recently changed and she's planning to get it refilled.  She states that her breathing is doing well and denies wheezing, SOB, chest pain.  States that she does use her albuterol nebulizer twice daily even when she is asymptomatic because it "opens her lungs" for the other inhalers. Her sister passed away last month and she has been struggling with depression.  Denies suicidal ideation.  She states that she lives with her son and has family support.  She is not interested in therapy or medication at this time.  As a result, she has started smoking 3-5 cigarettes per day but has plans to quit again using bupropion.  Today she complains of allergy symptoms and would like medication to treat them.  Endorses rhinorrhea, congestion, watery eyes, and sneezing.  Denies sore throat, cough.  She has used saline nasal spray with minimal relief.  She would also like a referral for a colonoscopy.   She would like a female provider and states that its required per her religion.  Past Medical History:  Diagnosis Date  . Anxiety   . COPD (chronic obstructive pulmonary disease) ([facility])   . Diverticulitis   . Migraines     Past Surgical History:  Procedure Laterality Date  . CESAREAN SECTION    . STAPLING OF BLEBS Left 10/31/2014   Procedure: STAPLING OF BLEBS;  Surgeon: Alleen Borne, MD;  [facility];   Service: Thoracic;  Laterality: Left;  Marland Kitchen VIDEO ASSISTED THORACOSCOPY Left 10/31/2014   Procedure: LEFT VIDEO ASSISTED THORACOSCOPY;  Surgeon: Alleen Borne, MD;  [facility];  Service: Thoracic;  Laterality: Left;    Family History  Problem Relation Age of Onset  . Lung cancer Father   . Lung cancer Brother        type?  . Colon cancer Neg Hx   . Breast cancer Neg Hx   . CAD Neg Hx   . Diabetes Neg Hx     Social History   Socioeconomic History  . Marital status: Divorced    Spouse name: Not on file  . Number of children: 1  . Years of education: Some College  . Highest education level: Not on file  Occupational History  . Occupation: not working currently  Tobacco Use  . Smoking status: Former Smoker    Packs/day: 5.00    Types: Cigarettes    Start date: 04/23/2018  . Smokeless tobacco: Never Used  . Tobacco comment: started age in her late teens; +/- 1 ppd  Substance and Sexual Activity  . Alcohol use: No    Alcohol/week: 0.0 standard drinks  . Drug use: No  . Sexual activity: Not on file  Other Topics Concern  . Not on file  Social History Narrative   Lives with son, [place]   Caffeine use: Coffee- 4-5 cups  per day   From [place] , father from [place]   Living in [place] since the 90s   Household- pt and son   Social Determinants of Health   Financial Resource Strain:   . Difficulty of Paying Living Expenses:   Food Insecurity:   . Worried About Programme researcher, broadcasting/film/video in the Last Year:   . Barista in the Last Year:   Transportation Needs:   . Freight forwarder (Medical):   Marland Kitchen Lack of Transportation (Non-Medical):   Physical Activity:   . Days of Exercise per Week:   . Minutes of Exercise per Session:   Stress:   . Feeling of Stress :   Social Connections:   . Frequency of Communication with Friends and Family:   . Frequency of Social Gatherings with Friends and Family:   . Attends Religious Services:   . Active Member of Clubs or Organizations:    . Attends Banker Meetings:   Marland Kitchen Marital Status:   Intimate Partner Violence:   . Fear of Current or Ex-Partner:   . Emotionally Abused:   Marland Kitchen Physically Abused:   . Sexually Abused:     Outpatient Medications Prior to Visit  Medication Sig Dispense Refill  . acetaminophen (TYLENOL) 500 MG tablet Take 1 tablet (500 mg total) by mouth every 6 (six) hours as needed. 30 tablet 0  . albuterol (PROVENTIL) (2.5 MG/3ML) 0.083% nebulizer solution USE 1 VIAL BY NEBULIZATION EVERY 4 HOURS AS NEEDED FOR WHEEZING OR SHORTNESS OF BREATH. 90 mL 2  . albuterol (VENTOLIN HFA) 108 (90 Base) MCG/ACT inhaler Inhale 1-2 puffs into the lungs every 6 (six) hours as needed for wheezing or shortness of breath. 18 g 1  . aspirin 325 MG tablet Take 325 mg by mouth daily.    Marland Kitchen atorvastatin (LIPITOR) 20 MG tablet TAKE 1 TABLET (20 MG TOTAL) BY MOUTH DAILY AT 6 PM. 30 tablet 3  . Budeson-Glycopyrrol-Formoterol (BREZTRI AEROSPHERE) 160-9-4.8 MCG/ACT AERO Inhale 2 puffs into the lungs 2 (two) times daily. 5.9 g 0  . Budeson-Glycopyrrol-Formoterol (BREZTRI AEROSPHERE) 160-9-4.8 MCG/ACT AERO Inhale 2 puffs into the lungs 2 (two) times daily. 5.9 g 0  . budesonide-formoterol (SYMBICORT) 160-4.5 MCG/ACT inhaler Take 2 puffs first thing in am and then another 2 puffs about 12 hours later. 1 Inhaler 6  . buPROPion (WELLBUTRIN SR) 150 MG 12 hr tablet Take 1 tablet (150 mg total) by mouth 2 (two) times daily. 120 tablet 6  . hydrOXYzine (ATARAX/VISTARIL) 10 MG tablet Take 1 tablet (10 mg total) by mouth every 4 (four) hours as needed. 40 tablet 1  . ibuprofen (ADVIL,MOTRIN) 200 MG tablet Take 400 mg by mouth every 6 (six) hours as needed for headache or moderate pain.    . Misc. Devices MISC Nebulizer DX: COPD J44.9 1 each 0  . phenol (CHLORASEPTIC) 1.4 % LIQD Use as directed 1 spray in the mouth or throat as needed for throat irritation / pain. 236 mL 0  . umeclidinium bromide (INCRUSE ELLIPTA) 62.5 MCG/INH AEPB  Inhale 1 puff into the lungs daily. 30 each 6  . diclofenac sodium (VOLTAREN) 1 % GEL Apply 2 g topically 4 (four) times daily. (Patient not taking: Reported on 12/24/2018) 100 g 2  . gabapentin (NEURONTIN) 300 MG capsule Take 1 capsule (300 mg total) by mouth at bedtime. (Patient not taking: Reported on 02/15/2018) 90 capsule 2  . traZODone (DESYREL) 50 MG tablet Take 0.5-1 tablets (25-50 mg total) by  mouth at bedtime as needed for sleep. (Patient not taking: Reported on 02/15/2018) 30 tablet 0  . umeclidinium bromide (INCRUSE ELLIPTA) 62.5 MCG/INH AEPB Inhale 1 puff into the lungs daily. (Patient not taking: Reported on 12/24/2018) 14 each 0   No facility-administered medications prior to visit.    No Known Allergies  ROS Review of Systems  Constitutional: Negative for fatigue, fever and unexpected weight change.  HENT: Positive for congestion, rhinorrhea and sneezing. Negative for ear pain, sinus pressure, sinus pain and sore throat.   Eyes: Positive for discharge. Negative for redness, itching and visual disturbance.  Respiratory: Negative for cough, chest tightness, shortness of breath and wheezing.   Cardiovascular: Negative for chest pain, palpitations and leg swelling.  Gastrointestinal: Negative for abdominal distention, abdominal pain, constipation, diarrhea, nausea and vomiting.  Endocrine: Negative for polydipsia and polyuria.  Genitourinary: Negative for decreased urine volume, difficulty urinating and dysuria.  Musculoskeletal: Negative for arthralgias and myalgias.  Skin: Negative for color change and rash.  Neurological: Negative for dizziness, tremors, weakness and numbness.  Hematological: Does not bruise/bleed easily.  Psychiatric/Behavioral: Negative for agitation and behavioral problems.      Objective:    Physical Exam  Constitutional: She is oriented to person, place, and time. She appears well-developed and well-nourished. No distress.  HENT:  Head:  Normocephalic and atraumatic.  Nose: Rhinorrhea present. No mucosal edema.  Mouth/Throat: Oropharynx is clear and moist.  Eyes: Pupils are equal, round, and reactive to light. Conjunctivae and EOM are normal.  Cardiovascular: Normal rate, regular rhythm, normal heart sounds and intact distal pulses.  Pulmonary/Chest: Effort normal and breath sounds normal. No respiratory distress. She has no wheezes.  Abdominal: Soft. Bowel sounds are normal. She exhibits no distension. There is no abdominal tenderness.  Musculoskeletal:        General: No edema. Normal range of motion.  Neurological: She is alert and oriented to person, place, and time.  Skin: Skin is warm and dry. No rash noted.  Nursing note and vitals reviewed.   BP 119/80   Pulse 87   Ht 5\' 5"  (1.651 m)   Wt 181 lb 6.4 oz (82.3 kg)   SpO2 96%   BMI 30.19 kg/m  Wt Readings from Last 3 Encounters:  04/27/19 181 lb 6.4 oz (82.3 kg)  12/24/18 180 lb 12.8 oz (82 kg)  02/15/18 185 lb (83.9 kg)     Health Maintenance Due  Topic Date Due  . COLONOSCOPY  Never done  . DEXA SCAN  Never done  . MAMMOGRAM  02/17/2017    There are no preventive care reminders to display for this patient.  Lab Results  Component Value Date   TSH 1.710 11/18/2016   Lab Results  Component Value Date   WBC 7.8 12/19/2015   HGB 13.4 12/19/2015   HCT 40.1 12/19/2015   MCV 85.7 12/19/2015   PLT 246 12/19/2015   Lab Results  Component Value Date   NA 145 (H) 02/15/2018   K 4.9 02/15/2018   CO2 22 02/15/2018   GLUCOSE 90 02/15/2018   BUN 15 02/15/2018   CREATININE 0.74 02/15/2018   BILITOT 0.4 02/15/2018   ALKPHOS 96 02/15/2018   AST 20 02/15/2018   ALT 17 02/15/2018   PROT 7.2 02/15/2018   ALBUMIN 4.4 02/15/2018   CALCIUM 10.6 (H) 02/15/2018   ANIONGAP 8 12/19/2015   Lab Results  Component Value Date   CHOL 136 11/18/2016   Lab Results  Component Value Date  HDL 57 11/18/2016   Lab Results  Component Value Date   LDLCALC  53 11/18/2016   Lab Results  Component Value Date   TRIG 129 11/18/2016   Lab Results  Component Value Date   CHOLHDL 2.4 11/18/2016   Lab Results  Component Value Date   HGBA1C 5.6 12/18/2015      Assessment & Plan:   1. COPD GOLD III No acute exacerbation Educated about the importance of smoking cessation and progression of COPD Advised to contact [facility] for refills on inhaled medications in order to prevent prescribing errors and duplicate medications Keep May 2021 appointment with pulmonology  2. Screening for colon cancer Screening Requesting a female provider due to religious reasons - Ambulatory referral to Gastroenterology  3. Cigarette smoker Smokes 3-5 cigarettes/day Recently restarted due to death of sister one month ago Spent 3 minutes educating on the importance of quitting smoking especially considering her COPD Continue bupropion  4. Bereavement Sister passed away in her home country and she was not able to see her before she died.  She has another sister who is very sick as well.  Not currently interesting in talking to a therapist.  She has family support.  5. Seasonal allergies Begin Zyrtec Resume Flonase Avoid allergins if possible - cetirizine (ZYRTEC) 10 MG tablet; Take 1 tablet (10 mg total) by mouth daily.  Dispense: 30 tablet; Refill: 1 - fluticasone (FLONASE) 50 MCG/ACT nasal spray; Place 2 sprays into both nostrils daily.  Dispense: 16 g; Refill: 1   Meds ordered this encounter  Medications  . cetirizine (ZYRTEC) 10 MG tablet    Sig: Take 1 tablet (10 mg total) by mouth daily.    Dispense:  30 tablet    Refill:  1  . fluticasone (FLONASE) 50 MCG/ACT nasal spray    Sig: Place 2 sprays into both nostrils daily.    Dispense:  16 g    Refill:  1    Follow-up: Return in about 6 months (around 10/28/2019) for Chronic disease management.    Janann August, RN   Evaluation and management procedures were performed by me with  DNP Student in attendance, note written by DNP student under my supervision and collaboration. I have reviewed the note and I agree with the management and plan.  [name] [name] has both Symbicort and Breztri on her medication list and has been advised to reach out to her Pulmonologist for clarification as she should bot be on both. Advised that smoking cessation will retard progression of her COPD. She declines grief counseling for her recent bereavement but has been advised to notify the clinic if she changes her mind.  Hoy Register, MD, FAAFP. [facility] [place], [place] [phone]   04/27/2019, 7:46 PM

## 2019-04-28 ENCOUNTER — Telehealth: Payer: Self-pay | Admitting: Internal Medicine

## 2019-04-28 MED ORDER — BREZTRI AEROSPHERE 160-9-4.8 MCG/ACT IN AERO: 2.0000 | INHALATION_SPRAY | Freq: Two times a day (BID) | RESPIRATORY_TRACT | 11 refills | Status: DC

## 2019-04-28 NOTE — Telephone Encounter (Signed)
Pt's son called about this again. Please return call.

## 2019-04-28 NOTE — Telephone Encounter (Signed)
Called Raphael and there was NA, and no option to leave msg

## 2019-04-28 NOTE — Telephone Encounter (Signed)
Called him back and again there was NA and no option to leave msg  I went ahead and sent the [facility]

## 2019-04-29 NOTE — Telephone Encounter (Signed)
Called and spoke with pt' son Nancy Marus and let him know that rx was sent to pharmacy for pt. Raphael verbalized understanding. Nothing further needed.

## 2019-05-02 ENCOUNTER — Encounter: Payer: Self-pay | Admitting: Nurse Practitioner

## 2019-05-02 MED FILL — ALBUTEROL SUL 2.5 MG/3 ML S: (2.5 MG/3ML | 25 days supply | Qty: 90 | Fill #1

## 2019-05-16 MED FILL — BUPROPION SR 150 MG TABLET: 150 | 30 days supply | Qty: 60 | Fill #1

## 2019-05-16 MED FILL — ALBUTEROL SUL 2.5 MG/3 ML S: (2.5 MG/3ML | 25 days supply | Qty: 90 | Fill #2

## 2019-05-16 MED FILL — ATORVASTATIN CALCIUM 20 MG: 20 | 30 days supply | Qty: 30 | Fill #1

## 2019-05-19 ENCOUNTER — Telehealth: Payer: Self-pay | Admitting: Internal Medicine

## 2019-05-19 NOTE — Telephone Encounter (Signed)
ATC pt's son, received a fast busy signal x2. Will try back.

## 2019-05-20 MED ORDER — BREZTRI AEROSPHERE 160-9-4.8 MCG/ACT IN AERO: 2.0000 | INHALATION_SPRAY | Freq: Two times a day (BID) | RESPIRATORY_TRACT | 3 refills | Status: DC

## 2019-05-20 NOTE — Telephone Encounter (Signed)
Spoke with patient's son. I advised him that I would print and send RX to [facility] for his mom. He verbalized understanding.   Nothing further needed at time of call.

## 2019-05-20 NOTE — Telephone Encounter (Signed)
Pt emergency contact returning call. Can be best reached before 2:00pm. [phone]

## 2019-06-03 ENCOUNTER — Other Ambulatory Visit: Payer: Self-pay | Admitting: Family Medicine

## 2019-06-03 DIAGNOSIS — J449 Chronic obstructive pulmonary disease, unspecified: Secondary | ICD-10-CM

## 2019-06-03 MED FILL — ALBUTEROL SUL 2.5 MG/3 ML S: (2.5 MG/3ML | 25 days supply | Qty: 90 | Fill #0

## 2019-06-20 MED FILL — ALBUTEROL SUL 2.5 MG/3 ML S: (2.5 MG/3ML | 25 days supply | Qty: 90 | Fill #1

## 2019-06-23 ENCOUNTER — Encounter: Payer: Self-pay | Admitting: Family Medicine

## 2019-06-24 ENCOUNTER — Ambulatory Visit: Payer: Medicare Other | Admitting: Internal Medicine

## 2019-06-27 ENCOUNTER — Ambulatory Visit (INDEPENDENT_AMBULATORY_CARE_PROVIDER_SITE_OTHER): Payer: Medicare Other | Admitting: Internal Medicine

## 2019-06-27 ENCOUNTER — Other Ambulatory Visit: Payer: Self-pay

## 2019-06-27 ENCOUNTER — Encounter: Payer: Self-pay | Admitting: Internal Medicine

## 2019-06-27 DIAGNOSIS — F17210 Nicotine dependence, cigarettes, uncomplicated: Secondary | ICD-10-CM

## 2019-06-27 DIAGNOSIS — J449 Chronic obstructive pulmonary disease, unspecified: Secondary | ICD-10-CM | POA: Diagnosis not present

## 2019-06-27 NOTE — Progress Notes (Signed)
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 PULMONARY CONSULT    Spontaneous Left Pneumothorax. Pt states that she has increased dyspnea with exertion. Pt c/o of occasional wet cough with yellow to clear mucus and chest pain with breathing. Pt denies any wheeze.   still way overusing saba at baseline mostly in neb form with doe x across the room and not using symbicort or pain meds correctly (see a/p) rec Plan A = Automatic = Symbiocrt 160 one twice daily and Incruse one click each am  Plan B = Backup - Only use your albuterol (Yellow/proventil) as a rescue medication Plan C = Crisis/ contingency - nebulizer, ok use with albuterol (or combination until you use it up)  every 4 hours if plan B is not working  For pain > tramadol 50 mg up to 2 every 4 hours  For nerves> alprazolam 0.5 mg up to one every 4 hours      11/24/2014  f/u ov/[name] [name] re: copd/ pfts on symb/ incruse still using saba  Chief Complaint  Patient presents with  . Follow-up    pt following for COPD: pt states she is doing pretty well, she is still having pain on her left side, but it [place] much better.  pt states her breathing has been very well. pt c/o increase SOB when she exerts herself. no c/o chest tightness. pt needs refills on meds.    doe = MMRC1 = can walk nl pace, flat grade, can't hurry or go uphills or steps s sob  rec Please remember to go to the lab  department downstairs for your tests - we will call you with the results when they are available. Symbicort 160 Take1 puffs first thing in am and then another1 puffs about 12 hours later.  Goal is not to need to rescue albuterol  much at all      01/31/2015  f/u ov/[name] [name] re: COPD III/ symbicort 160 2bid/ incruse and prn saba  Chief Complaint  Patient presents with  . Follow-up    Pt states that breathing is unchanged since last visit. Pt states that she is under a lot of stress right now which has made her breathing worse   2x daily neb but not noct/ no hfa use  Pain L cw same as post op never improved, very sensitive chest wall / not resp to tramadol so far rec Please see patient coordinator before you leave today  to schedule referral to Internal Medicine/ Dr Drue Novel  Zostrix cream 4 x daily (over the counter) - as improves can reduce to bedtime only  Plan A = Automatic = Symbiocrt 160 one twice daily and Incruse one click each am  Plan B = Backup - Only use your albuterol (Yellow/proventil) as a rescue medication Plan C = Crisis/ contingency - nebulizer, ok use with albuterol (or combination until you use it up)  every 4  hours if plan B is not working    08/29/2016  f/u ov/[name] [name] re:   COPD GOLD III/ quit smoking  02/2016 Chief Complaint  Patient presents with  . Follow-up    l/s 01/2015- pt stopped smoking X6 mos ago, noted weight gain, since then c/o increased SOB, sinus congestion, PND, hoarseness.    walking 30 min s stopping sev times a week better if use albturol first despite using symbicort 160 2bid  Does not recall response to prednisone or incruse  Easily confused with details of care / access to meds issues  Doe = MMRC2 = can't walk a nl pace on a flat grade s sob but does fine slow and flat  rec Please see patient coordinator before you leave today  to schedule referral to Internal Medicine/ Dr Drue Novel  Zostrix cream 4 x daily (over the counter) - as improves can reduce to  bedtime only  Plan A = Automatic = Symbiocrt 160 one twice daily and Incruse one click each am  Plan B = Backup - Only use your albuterol (Yellow/proventil) as a rescue medication Plan C = Crisis/ contingency - nebulizer, ok use with albuterol (or combination until you use it up)  every 4 hours if plan B is not working  Please schedule a follow up visit in 3 months but call sooner if needed - once establish with Dr Drue Novel follow up here can be as needed  Late add: confused as to whether she uses the symb one bid vs 2 bid will leave things as they are right now due to communication barriers but ideally the dose should be 2 pffs every 12 hours unless can't tolerate for some reason     12/24/2018  f/u ov/[name] [name] re: re-establish re COPD GOLD III/ resumed smoking / problems with access to meds  Chief Complaint  Patient presents with  . Consult    Patient is here for COPD. Patient has started smoking again since the virus started and want's help to stop.   Dyspnea:  Has a 15 min course around house s stopping Cough: none  Sleeping: ok flat s respiratory symptoms  SABA use: avg twice daily  02: none  rec Plan A = Automatic = Symbicort 160 Take 2 puffs first thing in am and then another 2 puffs about 12 hours later.  And when available Incruse each am  (an option that has both is called Trelegy)  Plan B = Backup - Only use your albuterol (Yellow/proventil) as a rescue medication  Plan C = Crisis/ contingency - nebulizer, ok use with albuterol (or combination until you use it up)  every 4 hours if plan B is not working       06/27/2019  f/u ov/[name] [name] re:  GOLD III/ still smoking   breztri maint Chief Complaint  Patient presents with  . Follow-up    Breathing is overall doing well. She has occ cough with white sputum.  She is using her albuterol about 2 x per day.   Dyspnea:  Walking outside x 30 min  Cough: smoker's rattle worse in am's/ min mucoid Sleeping: flat bed one bed  SABA use: twice a  day  02: none     No obvious day to day or daytime variability or assoc   purulent sputum or mucus plugs or hemoptysis or cp or chest tightness, subjective wheeze or overt sinus or hb symptoms.   sleeping  without nocturnal    exacerbation  of respiratory  c/o's  or need for noct saba. Also denies any obvious fluctuation of symptoms with weather or environmental changes or other aggravating or alleviating factors except as outlined above   No unusual exposure hx or h/o childhood pna/ asthma or knowledge of premature birth.  Current Allergies, Complete Past Medical History, Past Surgical History, Family History, and Social History were reviewed in [employer] record.  ROS  The following are not active complaints unless bolded Hoarseness, sore throat, dysphagia, dental problems, itching, sneezing,  nasal congestion or discharge of excess mucus or purulent secretions, ear ache,   fever, chills, sweats, unintended wt loss or wt gain, classically pleuritic or exertional cp,  orthopnea pnd or arm/hand swelling  or leg swelling, presyncope, palpitations, abdominal pain, anorexia, nausea, vomiting, diarrhea  or change in bowel habits or change in bladder habits, change in stools or change in urine, dysuria, hematuria,  rash, arthralgias, visual complaints, headache, numbness, weakness or ataxia or problems with walking or coordination,  change in mood or  memory.        Current Meds  Medication Sig  . acetaminophen (TYLENOL) 500 MG tablet Take 1 tablet (500 mg total) by mouth every 6 (six) hours as needed.  Marland Kitchen albuterol (PROVENTIL) (2.5 MG/3ML) 0.083% nebulizer solution USE 1 VIAL BY NEBULIZATION EVERY 4 HOURS AS NEEDED FOR WHEEZING OR SHORTNESS OF BREATH.  Marland Kitchen albuterol (VENTOLIN HFA) 108 (90 Base) MCG/ACT inhaler Inhale 1-2 puffs into the lungs every 6 (six) hours as needed for wheezing or shortness of breath.  Marland Kitchen aspirin 325 MG tablet Take 325 mg by mouth daily.  Marland Kitchen atorvastatin  (LIPITOR) 20 MG tablet TAKE 1 TABLET (20 MG TOTAL) BY MOUTH DAILY AT 6 PM.  . Budeson-Glycopyrrol-Formoterol (BREZTRI AEROSPHERE) 160-9-4.8 MCG/ACT AERO Inhale 2 puffs into the lungs 2 (two) times daily.  Marland Kitchen buPROPion (WELLBUTRIN SR) 150 MG 12 hr tablet Take 1 tablet (150 mg total) by mouth 2 (two) times daily.  . cetirizine (ZYRTEC) 10 MG tablet Take 1 tablet (10 mg total) by mouth daily.  . diclofenac sodium (VOLTAREN) 1 % GEL Apply 2 g topically 4 (four) times daily.  . fluticasone (FLONASE) 50 MCG/ACT nasal spray Place 2 sprays into both nostrils daily.  Marland Kitchen gabapentin (NEURONTIN) 300 MG capsule Take 1 capsule (300 mg total) by mouth at bedtime.  . hydrOXYzine (ATARAX/VISTARIL) 10 MG tablet Take 1 tablet (10 mg total) by mouth every 4 (four) hours as needed.  Marland Kitchen ibuprofen (ADVIL,MOTRIN) 200 MG tablet Take 400 mg by mouth every 6 (six) hours as needed for headache or moderate pain.  . Misc. Devices MISC Nebulizer DX: COPD J44.9  . phenol (CHLORASEPTIC) 1.4 % LIQD Use as directed 1 spray in the mouth or throat as needed for throat irritation / pain.  . traZODone (DESYREL) 50 MG tablet Take 0.5-1 tablets (25-50 mg total) by mouth at bedtime as needed for sleep.                 Objective:   Physical Exam    amb mod obese latina nad   06/27/2019        184   12/24/2018    180  11/24/2014      177 > 01/31/2015 180 >  08/29/2016    190     11/10/14 177 lb 3.2 oz (80.377 kg)  11/08/14 178 lb 5 oz (80.882 kg)  10/26/14 177 lb 1.6 oz (80.332 kg)    Vital signs reviewed  06/27/2019  - Note at rest  02 sats  96% on RA   HEENT : pt wearing mask not removed for exam due to covid - 19 concerns.   NECK :  without JVD/Nodes/TM/ nl carotid upstrokes bilaterally   LUNGS: no acc muscle use,  Min barrel  contour chest wall with bilateral  slightly decreased bs s audible wheeze and  without cough on insp or exp maneuvers and min  Hyperresonant  to  percussion bilaterally     CV:  RRR  no s3 or  murmur or increase in P2, and no edema   ABD:  soft and nontender with pos end  insp Hoover's  in the supine position. No bruits or organomegaly appreciated, bowel sounds nl  MS:   Nl gait/  ext warm without deformities, calf tenderness, cyanosis or clubbing No obvious joint restrictions   SKIN: warm and dry without lesions    NEURO:  alert, approp, nl sensorium with  no motor or cerebellar deficits apparent.        Declined cxr 06/27/2019          Assessment & Plan:

## 2019-06-27 NOTE — Assessment & Plan Note (Addendum)
Active smoker  - 11/10/2014  new maint rx = symb 160/incruse sample - Alpha one AT 11/24/2014 >  MM/ nl levels - 01/31/2015  extensive coaching HFA effectiveness =    90%  - spirometry 01/31/2015  FEV1  0.85 (37%) ratio 34  - Spirometry 08/29/2016  FEV1 0.71 (30%)  Ratio 36  > added back LAMA = incruse samples   - 06/27/2019  After extensive coaching inhaler device,  effectiveness =    90% > continue breztri    Group D in terms of symptom/risk and laba/lama/ICS  therefore appropriate rx at this point >>>  breztri appropriate  Pt informed of the seriousness of COVID 19 infection as a direct risk to lung health  and safey and to close contacts and should continue to wear a facemask in public and minimize exposure to public locations but especially avoid any area or activity where non-close contacts are not observing distancing or wearing an appropriate face mask.  I strongly recommended vaccine asap thru local drugstore.          Each maintenance medication was reviewed in detail including emphasizing most importantly the difference between maintenance and prns and under what circumstances the prns are to be triggered using an action plan format where appropriate.  Total time for H and P, chart review, counseling, teaching device and generating customized AVS unique to this office visit / charting = 20 min

## 2019-06-27 NOTE — Patient Instructions (Signed)
The key is to stop smoking completely before smoking completely stops you!  For smoking cessation info  call [phone]   Try albuterol 15 min before an activity that you know would make you short of breath and see if it makes any difference and if makes none then don't take it after activity unless you can't catch your breath.   I strongly recommend you take the vaccine as soon as possible    Please schedule a follow up visit in  6 months but call sooner if needed

## 2019-06-29 ENCOUNTER — Encounter: Payer: Self-pay | Admitting: Internal Medicine

## 2019-06-29 NOTE — Assessment & Plan Note (Signed)
4-5 min discussion re active cigarette smoking in addition to office E&M  Ask about tobacco use:   ongoing Advise quitting   I took an extended  opportunity with this patient to outline the consequences of continued cigarette use  in airway disorders based on all the data we have from the multiple national lung health studies (perfomed over decades at millions of dollars in cost)  indicating that smoking cessation, not choice of inhalers or physicians, is the most important aspect of her care.   Assess willingness:  Not committed at this point - lots of stress  Assist in quit attempt:  Per PCP when ready Arrange follow up:   Follow up per Primary Care planned  For smoking cessation info call [phone]

## 2019-07-13 ENCOUNTER — Ambulatory Visit: Payer: Medicare Other | Admitting: Family Medicine

## 2019-07-20 MED FILL — ATORVASTATIN CALCIUM 20 MG: 20 | 30 days supply | Qty: 30 | Fill #2

## 2019-07-20 MED FILL — BUPROPION SR 150 MG TABLET: 150 | 30 days supply | Qty: 60 | Fill #2

## 2019-07-26 ENCOUNTER — Telehealth: Payer: Self-pay | Admitting: Family Medicine

## 2019-07-26 NOTE — Telephone Encounter (Signed)
Patient would like referral open. So she can schedule. Please follow up at your earliest convenience. Patient requested for a call once it is open again to schedule.

## 2019-07-27 NOTE — Telephone Encounter (Signed)
Noted. Thanks.

## 2019-08-02 ENCOUNTER — Other Ambulatory Visit: Payer: Self-pay | Admitting: Family Medicine

## 2019-08-02 DIAGNOSIS — J449 Chronic obstructive pulmonary disease, unspecified: Secondary | ICD-10-CM

## 2019-08-03 MED FILL — ALBUTEROL SUL 2.5 MG/3 ML S: (2.5 MG/3ML | 25 days supply | Qty: 90 | Fill #0

## 2019-08-22 ENCOUNTER — Ambulatory Visit: Payer: Medicare Other | Admitting: Family Medicine

## 2019-08-25 LAB — HM COLONOSCOPY

## 2019-09-14 ENCOUNTER — Encounter (INDEPENDENT_AMBULATORY_CARE_PROVIDER_SITE_OTHER): Payer: Medicare Other | Admitting: Ophthalmology

## 2019-09-14 ENCOUNTER — Other Ambulatory Visit: Payer: Self-pay

## 2019-09-14 DIAGNOSIS — H43813 Vitreous degeneration, bilateral: Secondary | ICD-10-CM

## 2019-09-14 DIAGNOSIS — H353132 Nonexudative age-related macular degeneration, bilateral, intermediate dry stage: Secondary | ICD-10-CM

## 2019-09-14 DIAGNOSIS — H33301 Unspecified retinal break, right eye: Secondary | ICD-10-CM | POA: Diagnosis not present

## 2019-09-14 DIAGNOSIS — H2513 Age-related nuclear cataract, bilateral: Secondary | ICD-10-CM | POA: Diagnosis not present

## 2019-09-29 ENCOUNTER — Other Ambulatory Visit: Payer: Self-pay

## 2019-09-29 ENCOUNTER — Encounter (INDEPENDENT_AMBULATORY_CARE_PROVIDER_SITE_OTHER): Payer: Medicare Other | Admitting: Ophthalmology

## 2019-09-29 DIAGNOSIS — H33301 Unspecified retinal break, right eye: Secondary | ICD-10-CM

## 2020-01-13 ENCOUNTER — Encounter (INDEPENDENT_AMBULATORY_CARE_PROVIDER_SITE_OTHER): Payer: Medicare Other | Admitting: Ophthalmology

## 2020-01-13 ENCOUNTER — Other Ambulatory Visit: Payer: Self-pay

## 2020-01-13 DIAGNOSIS — H353132 Nonexudative age-related macular degeneration, bilateral, intermediate dry stage: Secondary | ICD-10-CM

## 2020-01-13 DIAGNOSIS — H33301 Unspecified retinal break, right eye: Secondary | ICD-10-CM

## 2020-01-13 DIAGNOSIS — H43813 Vitreous degeneration, bilateral: Secondary | ICD-10-CM

## 2020-03-02 ENCOUNTER — Emergency Department (HOSPITAL_COMMUNITY): Payer: Medicare Other

## 2020-03-02 ENCOUNTER — Encounter (HOSPITAL_COMMUNITY): Payer: Self-pay | Admitting: Emergency Medicine

## 2020-03-02 ENCOUNTER — Other Ambulatory Visit: Payer: Self-pay

## 2020-03-02 ENCOUNTER — Inpatient Hospital Stay (HOSPITAL_COMMUNITY)
Admission: EM | Admit: 2020-03-02 | Discharge: 2020-03-03 | DRG: 177 | Disposition: A | Discharge status: Home / Self Care | Payer: Medicare Other | Attending: Internal Medicine | Admitting: Internal Medicine

## 2020-03-02 DIAGNOSIS — Z79891 Long term (current) use of opiate analgesic: Secondary | ICD-10-CM

## 2020-03-02 DIAGNOSIS — R0902 Hypoxemia: Secondary | ICD-10-CM

## 2020-03-02 DIAGNOSIS — F418 Other specified anxiety disorders: Secondary | ICD-10-CM | POA: Diagnosis present

## 2020-03-02 DIAGNOSIS — F411 Generalized anxiety disorder: Secondary | ICD-10-CM | POA: Diagnosis not present

## 2020-03-02 DIAGNOSIS — J9600 Acute respiratory failure, unspecified whether with hypoxia or hypercapnia: Secondary | ICD-10-CM | POA: Diagnosis present

## 2020-03-02 DIAGNOSIS — J9601 Acute respiratory failure with hypoxia: Secondary | ICD-10-CM | POA: Diagnosis not present

## 2020-03-02 DIAGNOSIS — Z79899 Other long term (current) drug therapy: Secondary | ICD-10-CM | POA: Diagnosis not present

## 2020-03-02 DIAGNOSIS — Z7982 Long term (current) use of aspirin: Secondary | ICD-10-CM | POA: Diagnosis not present

## 2020-03-02 DIAGNOSIS — U071 COVID-19: Principal | ICD-10-CM | POA: Diagnosis present

## 2020-03-02 DIAGNOSIS — Z6833 Body mass index (BMI) 33.0-33.9, adult: Secondary | ICD-10-CM | POA: Diagnosis not present

## 2020-03-02 DIAGNOSIS — Z801 Family history of malignant neoplasm of trachea, bronchus and lung: Secondary | ICD-10-CM | POA: Diagnosis not present

## 2020-03-02 DIAGNOSIS — Z716 Tobacco abuse counseling: Secondary | ICD-10-CM | POA: Diagnosis not present

## 2020-03-02 DIAGNOSIS — Z87891 Personal history of nicotine dependence: Secondary | ICD-10-CM | POA: Insufficient documentation

## 2020-03-02 DIAGNOSIS — F17210 Nicotine dependence, cigarettes, uncomplicated: Secondary | ICD-10-CM | POA: Diagnosis not present

## 2020-03-02 DIAGNOSIS — R0602 Shortness of breath: Secondary | ICD-10-CM | POA: Diagnosis present

## 2020-03-02 DIAGNOSIS — Z7951 Long term (current) use of inhaled steroids: Secondary | ICD-10-CM | POA: Diagnosis not present

## 2020-03-02 DIAGNOSIS — F419 Anxiety disorder, unspecified: Secondary | ICD-10-CM | POA: Diagnosis present

## 2020-03-02 DIAGNOSIS — Z8673 Personal history of transient ischemic attack (TIA), and cerebral infarction without residual deficits: Secondary | ICD-10-CM

## 2020-03-02 DIAGNOSIS — J449 Chronic obstructive pulmonary disease, unspecified: Secondary | ICD-10-CM | POA: Diagnosis not present

## 2020-03-02 HISTORY — DX: Other complications of anesthesia, initial encounter: T88.59XA

## 2020-03-02 HISTORY — DX: Dyspnea, unspecified: R06.00

## 2020-03-02 LAB — COMPREHENSIVE METABOLIC PANEL
ALT: 24 U/L (ref 0–44)
AST: 34 U/L (ref 15–41)
Albumin: 3.7 g/dL (ref 3.5–5.0)
Alkaline Phosphatase: 92 U/L (ref 38–126)
Anion gap: 13 (ref 5–15)
BUN: 14 mg/dL (ref 8–23)
CO2: 24 mmol/L (ref 22–32)
Calcium: 9 mg/dL (ref 8.9–10.3)
Chloride: 99 mmol/L (ref 98–111)
Creatinine, Ser: 0.78 mg/dL (ref 0.44–1.00)
GFR, Estimated: >60 mL/min (ref >60)
Glucose, Bld: 97 mg/dL (ref 70–99)
Potassium: 4.2 mmol/L (ref 3.5–5.1)
Sodium: 136 mmol/L (ref 135–145)
Total Bilirubin: 0.7 mg/dL (ref 0.3–1.2)
Total Protein: 7.5 g/dL (ref 6.5–8.1)

## 2020-03-02 LAB — TRIGLYCERIDES: Triglycerides: 58 mg/dL (ref <150)

## 2020-03-02 LAB — CBC WITH DIFFERENTIAL/PLATELET
Abs Immature Granulocytes: 0.05 10*3/uL (ref 0.00–0.07)
Basophils Absolute: 0 10*3/uL (ref 0.0–0.1)
Basophils Relative: 0 %
Eosinophils Absolute: 0 10*3/uL (ref 0.0–0.5)
Eosinophils Relative: 0 %
HCT: 42.7 % (ref 36.0–46.0)
Hemoglobin: 14.1 g/dL (ref 12.0–15.0)
Immature Granulocytes: 1 %
Lymphocytes Relative: 16 %
Lymphs Abs: 0.7 10*3/uL (ref 0.7–4.0)
MCH: 28.1 pg (ref 26.0–34.0)
MCHC: 33 g/dL (ref 30.0–36.0)
MCV: 85.1 fL (ref 80.0–100.0)
Monocytes Absolute: 0.4 10*3/uL (ref 0.1–1.0)
Monocytes Relative: 8 %
Neutro Abs: 3.4 10*3/uL (ref 1.7–7.7)
Neutrophils Relative %: 75 %
Platelets: 179 10*3/uL (ref 150–400)
RBC: 5.02 MIL/uL (ref 3.87–5.11)
RDW: 13.4 % (ref 11.5–15.5)
WBC: 4.5 10*3/uL (ref 4.0–10.5)
nRBC: 0 % (ref 0.0–0.2)

## 2020-03-02 LAB — POC SARS CORONAVIRUS 2 AG -  ED: SARS Coronavirus 2 Ag: POSITIVE — AB

## 2020-03-02 LAB — FERRITIN: Ferritin: 154 ng/mL (ref 11–307)

## 2020-03-02 LAB — PROCALCITONIN: Procalcitonin: <0.1 ng/mL

## 2020-03-02 LAB — C-REACTIVE PROTEIN: CRP: 4.4 mg/dL — ABNORMAL HIGH (ref <1.0)

## 2020-03-02 LAB — LACTATE DEHYDROGENASE: LDH: 168 U/L (ref 98–192)

## 2020-03-02 LAB — FIBRINOGEN: Fibrinogen: 596 mg/dL — ABNORMAL HIGH (ref 210–475)

## 2020-03-02 LAB — D-DIMER, QUANTITATIVE: D-Dimer, Quant: 0.45 ug/mL-FEU (ref 0.00–0.50)

## 2020-03-02 LAB — LACTIC ACID, PLASMA: Lactic Acid, Venous: 0.7 mmol/L (ref 0.5–1.9)

## 2020-03-02 MED ORDER — SODIUM CHLORIDE 0.9 % IV SOLN: 100.0000 mg | Freq: Every day | INTRAVENOUS | Status: DC

## 2020-03-02 MED ORDER — HYDROCOD POLST-CPM POLST ER 10-8 MG/5ML PO SUER: 5.0000 mL | Freq: Two times a day (BID) | ORAL | Status: DC | PRN

## 2020-03-02 MED ORDER — GUAIFENESIN-DM 100-10 MG/5ML PO SYRP: 10.0000 mL | ORAL_SOLUTION | ORAL | Status: DC | PRN

## 2020-03-02 MED ORDER — SODIUM CHLORIDE 0.9 % IV SOLN
200.0000 mg | Freq: Once | INTRAVENOUS | Status: AC
  Administered 2020-03-03: 200 mg via INTRAVENOUS
  Filled 2020-03-02: qty 40
  Filled 2020-03-02: qty 200

## 2020-03-02 MED ORDER — SODIUM CHLORIDE 0.9 % IV SOLN
100.0000 mg | Freq: Every day | INTRAVENOUS | Status: DC
  Filled 2020-03-02: qty 20

## 2020-03-02 MED ORDER — DEXAMETHASONE SODIUM PHOSPHATE 10 MG/ML IJ SOLN
6.0000 mg | Freq: Once | INTRAMUSCULAR | Status: AC
  Administered 2020-03-02: 6 mg via INTRAVENOUS
  Filled 2020-03-02: qty 1

## 2020-03-02 MED ORDER — ONDANSETRON HCL 4 MG PO TABS: 4.0000 mg | ORAL_TABLET | Freq: Four times a day (QID) | ORAL | Status: DC | PRN

## 2020-03-02 MED ORDER — SODIUM CHLORIDE 0.9 % IV SOLN: 200.0000 mg | Freq: Once | INTRAVENOUS | Status: DC

## 2020-03-02 MED ORDER — SODIUM CHLORIDE 0.9 % IV BOLUS
1000.0000 mL | Freq: Once | INTRAVENOUS | Status: AC
  Administered 2020-03-02: 1000 mL via INTRAVENOUS

## 2020-03-02 MED ORDER — ONDANSETRON HCL 4 MG/2ML IJ SOLN: 4.0000 mg | Freq: Four times a day (QID) | INTRAMUSCULAR | Status: DC | PRN

## 2020-03-02 MED ORDER — IOHEXOL 350 MG/ML SOLN
100.0000 mL | Freq: Once | INTRAVENOUS | Status: AC | PRN
  Administered 2020-03-02: 100 mL via INTRAVENOUS

## 2020-03-02 NOTE — ED Triage Notes (Signed)
Pt BIB EMS from home c/o shob and cough x 6 days. Exposure to COVID due to member of household testing + recently. Hx of COPD.   96% on RA  150/100   110 HR  26 RR 99.5 F

## 2020-03-02 NOTE — ED Provider Notes (Signed)
[facility]-EMERGENCY DEPT Provider Note   CSN: [id number] Arrival date & time: 03/02/20  1750     History Chief Complaint  Patient presents with  . Shortness of Breath  . Cough  . Covid Exposure    [name] [name] is a 70 y.o. female.  70 year old female brought in by EMS for possible COVID today, onset of symptoms 6 days ago, reports SHOB, body aches, fevers, headache, vomiting. Son tested positive, others in house have been sick but not tested. Has not had COVID vaccine. History of COPD, has been using inhalers and nebulizer. Called EMS today because of difficulty breathing. Patient is taking Ivermectin.   [name] [name] was evaluated in Emergency Department on 03/02/2020 for the symptoms described in the history of present illness. She was evaluated in the context of the global COVID-19 pandemic, which necessitated consideration that the patient might be at risk for infection with the SARS-CoV-2 virus that causes COVID-19. Institutional protocols and algorithms that pertain to the evaluation of patients at risk for COVID-19 are in a state of rapid change based on information released by regulatory bodies including the CDC and federal and state organizations. These policies and algorithms were followed during the patient's care in the ED.         Past Medical History:  Diagnosis Date  . Anxiety   . COPD (chronic obstructive pulmonary disease) (HCC)   . Diverticulitis   . Migraines     Patient Active Problem List   Diagnosis Date Noted  . Acute respiratory failure due to COVID-19 (HCC) 03/02/2020  . CVA (cerebral vascular accident) (HCC) 12/17/2015  . Left shoulder pain 11/22/2015  . PCP NOTE>>>>>>>>>>>>>>>>>>>>>>>>>>>> 05/03/2015  . Anxiety state 03/12/2015  . Cigarette smoker 03/12/2015  . COPD GOLD III 11/11/2014  . Morbid obesity due to excess calories (HCC) 11/11/2014  . Pneumothorax, spontaneous, tension 10/26/2014    Past Surgical History:   Procedure Laterality Date  . CESAREAN SECTION    . STAPLING OF BLEBS Left 10/31/2014   Procedure: STAPLING OF BLEBS;  Surgeon: Alleen Borne, MD;  Location: [facility] OR;  Service: Thoracic;  Laterality: Left;  Marland Kitchen VIDEO ASSISTED THORACOSCOPY Left 10/31/2014   Procedure: LEFT VIDEO ASSISTED THORACOSCOPY;  Surgeon: Alleen Borne, MD;  Location: [facility] OR;  Service: Thoracic;  Laterality: Left;     OB History   No obstetric history on file.     Family History  Problem Relation Age of Onset  . Lung cancer Father   . Lung cancer Brother        type?  . Colon cancer Neg Hx   . Breast cancer Neg Hx   . CAD Neg Hx   . Diabetes Neg Hx     Social History   Tobacco Use  . Smoking status: Former Smoker    Packs/day: 5.00    Types: Cigarettes    Start date: 04/23/2018  . Smokeless tobacco: Never Used  . Tobacco comment: started age in her late teens; +/- 1 ppd  Substance Use Topics  . Alcohol use: No    Alcohol/week: 0.0 standard drinks  . Drug use: No    Home Medications Prior to Admission medications   Medication Sig Start Date End Date Taking? Authorizing Provider  albuterol (PROVENTIL) (2.5 MG/3ML) 0.083% nebulizer solution USE 1 VIAL BY NEBULIZATION EVERY 4 HOURS AS NEEDED FOR WHEEZING OR SHORTNESS OF BREATH. 08/02/19  Yes Hoy Register, MD  aspirin 325 MG tablet Take 325 mg  by mouth daily.   Yes [provider]  Budeson-Glycopyrrol-Formoterol (BREZTRI AEROSPHERE) 160-9-4.8 MCG/ACT AERO Inhale 2 puffs into the lungs 2 (two) times daily. 03/30/19  Yes Nyoka Cowden, MD  buPROPion [facility] SR) 150 MG 12 hr tablet Take 1 tablet (150 mg total) by mouth 2 (two) times daily. 02/28/19  Yes Claiborne Rigg, NP  cetirizine (ZYRTEC) 10 MG tablet Take 1 tablet (10 mg total) by mouth daily. 04/27/19  Yes Hoy Register, MD  acetaminophen (TYLENOL) 500 MG tablet Take 1 tablet (500 mg total) by mouth every 6 (six) hours as needed. 03/18/16   Loletta Specter, PA-C  albuterol (VENTOLIN  HFA) 108 (90 Base) MCG/ACT inhaler Inhale 1-2 puffs into the lungs every 6 (six) hours as needed for wheezing or shortness of breath. Patient not taking: Reported on 03/02/2020 02/28/19   Claiborne Rigg, NP  atorvastatin (LIPITOR) 20 MG tablet TAKE 1 TABLET (20 MG TOTAL) BY MOUTH DAILY AT 6 PM. Patient not taking: Reported on 03/02/2020 04/05/19   Hoy Register, MD  diclofenac sodium (VOLTAREN) 1 % GEL Apply 2 g topically 4 (four) times daily. 01/01/16   Langeland, Kathaleen Grinder, MD  fluticasone (FLONASE) 50 MCG/ACT nasal spray Place 2 sprays into both nostrils daily. 04/27/19   Hoy Register, MD  gabapentin (NEURONTIN) 300 MG capsule Take 1 capsule (300 mg total) by mouth at bedtime. 01/01/16   Pete Glatter, MD  hydrOXYzine (ATARAX/VISTARIL) 10 MG tablet Take 1 tablet (10 mg total) by mouth every 4 (four) hours as needed. 12/24/18   Nyoka Cowden, MD  ibuprofen (ADVIL,MOTRIN) 200 MG tablet Take 400 mg by mouth every 6 (six) hours as needed for headache or moderate pain.    [provider]  Misc. Devices MISC Nebulizer DX: COPD J44.9 10/12/18   Hoy Register, MD  phenol (CHLORASEPTIC) 1.4 % LIQD Use as directed 1 spray in the mouth or throat as needed for throat irritation / pain. 12/29/16   Lizbeth Bark, FNP  traZODone (DESYREL) 50 MG tablet Take 0.5-1 tablets (25-50 mg total) by mouth at bedtime as needed for sleep. 12/29/16   Lizbeth Bark, FNP    Allergies    Patient has no known allergies.  Review of Systems   Review of Systems  Constitutional: Positive for fever.  HENT: Negative for sore throat.   Respiratory: Positive for cough and shortness of breath.   Cardiovascular: Negative for chest pain.  Gastrointestinal: Positive for diarrhea.  Musculoskeletal: Positive for arthralgias and myalgias.  Skin: Negative for rash.  Allergic/Immunologic: Negative for immunocompromised state.  Neurological: Positive for headaches. Negative for weakness.   Psychiatric/Behavioral: Negative for confusion.  All other systems reviewed and are negative.   Physical Exam Updated Vital Signs BP 128/87 (BP Location: Left Arm)   Pulse (!) 102   Temp 99.6 F (37.6 C) (Oral)   Resp (!) 24   SpO2 97%   Physical Exam Vitals and nursing note reviewed.  Constitutional:      General: She is not in acute distress.    Appearance: She is well-developed and well-nourished. She is not diaphoretic.  HENT:     Head: Normocephalic and atraumatic.  Cardiovascular:     Rate and Rhythm: Regular rhythm. Tachycardia present.  Pulmonary:     Effort: Pulmonary effort is normal.     Breath sounds: Examination of the right-lower field reveals decreased breath sounds. Examination of the left-lower field reveals decreased breath sounds. Decreased breath sounds present.  Abdominal:  Palpations: Abdomen is soft.     Tenderness: There is no abdominal tenderness.  Musculoskeletal:     Cervical back: Neck supple.     Right lower leg: No edema.     Left lower leg: No edema.  Skin:    General: Skin is warm and dry.     Findings: No erythema or rash.  Neurological:     Mental Status: She is alert and oriented to person, place, and time.  Psychiatric:        Mood and Affect: Mood is anxious.        Behavior: Behavior normal.     ED Results / Procedures / Treatments   Labs (all labs ordered are listed, but only abnormal results are displayed) Labs Reviewed  POC SARS CORONAVIRUS 2 AG -  ED - Abnormal; Notable for the following components:      Result Value   SARS Coronavirus 2 Ag POSITIVE (*)    All other components within normal limits  CULTURE, BLOOD (ROUTINE X 2)  CULTURE, BLOOD (ROUTINE X 2)  COMPREHENSIVE METABOLIC PANEL  CBC WITH DIFFERENTIAL/PLATELET  LACTIC ACID, PLASMA  LACTIC ACID, PLASMA  D-DIMER, QUANTITATIVE (NOT AT [facility])  PROCALCITONIN  LACTATE DEHYDROGENASE  FERRITIN  TRIGLYCERIDES  FIBRINOGEN  C-REACTIVE PROTEIN    EKG EKG  Interpretation  Date/Time:  Friday March 02 2020 18:25:43 EST Ventricular Rate:  100 PR Interval:    QRS Duration: 79 QT Interval:  333 QTC Calculation: 430 R Axis:   -33 Text Interpretation: Sinus tachycardia Left axis deviation No STEMI Confirmed by Alvester Chou [phone]) on 03/02/2020 6:29:25 PM   Radiology CT Angio Chest PE W/Cm &/Or Wo Cm  Result Date: 03/02/2020 CLINICAL DATA:  Cough, shortness of breath, COVID positive. EXAM: CT ANGIOGRAPHY CHEST WITH CONTRAST TECHNIQUE: Multidetector CT imaging of the chest was performed using the standard protocol during bolus administration of intravenous contrast. Multiplanar CT image reconstructions and MIPs were obtained to evaluate the vascular anatomy. CONTRAST:  OMNIPAQUE IOHEXOL 350 MG/ML SOLN COMPARISON:  11/08/2014 FINDINGS: Cardiovascular: Exam is limited by respiratory motion artifact. Within that limitation, there is no central pulmonary embolus. The size of the main pulmonary artery is normal. Heart size is normal, with no pericardial effusion. The course and caliber of the aorta are normal. There is mild atherosclerotic calcification. Opacification decreased due to pulmonary arterial phase contrast bolus timing. Mediastinum/Nodes: -- No mediastinal lymphadenopathy. -- No hilar lymphadenopathy. -- No axillary lymphadenopathy. -- No supraclavicular lymphadenopathy. -- Normal thyroid gland where visualized. -  Unremarkable esophagus. Lungs/Pleura: There are severe emphysematous changes bilaterally. Examination of the lung fields is limited by respiratory motion artifact. There is no pneumothorax. No large pleural effusion or focal infiltrate. The trachea is unremarkable. There is a 4 mm pulmonary nodule in the right middle lobe that is stable from prior study. Upper Abdomen: Contrast bolus timing is not optimized for evaluation of the abdominal organs. The visualized portions of the organs of the upper abdomen are normal. Musculoskeletal:  No chest wall abnormality. No bony spinal canal stenosis. Review of the MIP images confirms the above findings. IMPRESSION: 1. Exam is limited by respiratory motion artifact. Within that limitation, there is no central pulmonary embolus. 2. There are severe emphysematous changes bilaterally. Aortic Atherosclerosis (ICD10-I70.0) and Emphysema (ICD10-J43.9). Electronically Signed   By: Katherine Mantle M.D.   On: 03/02/2020 21:09   DG Chest Port 1 View  Result Date: 03/02/2020 CLINICAL DATA:  Cough and shortness of breath. Positive household COVID  exposure. History of COPD. EXAM: PORTABLE CHEST 1 VIEW COMPARISON:  Most recent radiograph 08/29/2016.  CT 11/08/2014 FINDINGS: Emphysema with paucity of apical lung markings. Minimal patchy airspace disease in the right greater than left lung base. Heart is normal in size. Unchanged mediastinal contours. Aortic atherosclerosis. No pneumothorax or large pleural effusion. No acute osseous abnormalities are seen. IMPRESSION: 1. Minimal patchy airspace disease in the right greater than left lung base, may be atelectasis or pneumonia. 2. Emphysema. Electronically Signed   By: Narda Rutherford M.D.   On: 03/02/2020 19:13    Procedures Procedures   Medications Ordered in ED Medications  remdesivir 200 mg in sodium chloride 0.9% 250 mL IVPB (has no administration in time range)    Followed by  remdesivir 100 mg in sodium chloride 0.9 % 100 mL IVPB (has no administration in time range)  sodium chloride 0.9 % bolus 1,000 mL (0 mLs Intravenous Stopped 03/02/20 2007)  iohexol (OMNIPAQUE) 350 MG/ML injection 100 mL (100 mLs Intravenous Contrast Given 03/02/20 2053)  dexamethasone (DECADRON) injection 6 mg (6 mg Intravenous Given 03/02/20 2149)    ED Course  I have reviewed the triage vital signs and the nursing notes.  Pertinent labs & imaging results that were available during my care of the patient were reviewed by me and considered in my medical decision making  (see chart for details).  Clinical Course as of 03/02/20 2216  Fri Mar 02, 2020  1829 70 yo female spanish w/ hx of COPD, smoker, pulm blebs, presenting to the ED with viral illness for 6 days.  Her son tested positive for Covid.  O2 sats 92-94% here.  Unvaccinated for covid.  Taking ivermectin at home.  Pending labs, covid test, reassessment  [MT]  2137 On recheck, O2 sats drop to 87% while talking with [place] O2 off. Plan is to add COVID admission orders, decadron and Remdesivir, consult for admission.  [LM]  2144 71 yo female w/ emphysema (not on home o2) presenting to ED with sob, found to be positive for covid here.  She is unvaccinated and has had sx for 7 days.  Worsening dyspnea at home.  ON exam she is sitting upright, can speak in full sentences, breathing heavily, distant breath sounds and moving air poorly.  She desats to 87% while talking to me after a period of approx 10 minutes with the translator, then stabilized then on 2L [place].  Given the expected timeline of her illness, which may worsen in the next 2 days, and inability to provide home o2 or good follow up, and her pulmonary comorbidities as a high risk patient, we'll admit on oxygen.  And given IV steroids and remdesivir.  Pt in agreement with plan.  She would benefit from breathing treatments if we are able to give nebulizers here [MT]  2211 Case discussed with Dr. Mikeal Hawthorne with [facility] who will consult for admission.  [LM]    Clinical Course User Index [LM] Jeannie Fend, PA-C [MT] Renaye Rakers Kermit Balo, MD   MDM Rules/Calculators/A&P                          Final Clinical Impression(s) / ED Diagnoses Final diagnoses:  COVID-19  Hypoxemia  Chronic obstructive pulmonary disease, unspecified COPD type [facility])    Rx / DC Orders ED Discharge Orders    None       Alden Hipp 03/02/20 2216    Terald Sleeper, MD  03/03/20 0017  

## 2020-03-02 NOTE — H&P (Signed)
History and Physical   [name] [name] JXB:[id number] DOB: [birth year] DOA: 03/02/2020  Referring MD/NP/PA: Dr. Renaye Rakers  PCP: Hoy Register, MD   Outpatient Specialists: None  Patient coming from: Home  Chief Complaint: Shortness of breath  HPI: [name] [name] is a 70 y.o. female with medical history significant of COPD, diverticular disease, anxiety disorder, tobacco abuse who presents to the ED with shortness of breath cough for about 7 days.  Patient is on vaccinated.  She was found to be COVID-19 positive.  Patient also found to have initial hypoxia oxygen sats dropping to 87% with mild activity.  It is 90 to 94% at rest.  Has no prior home oxygen.  Patient evaluated and found to have acute hypoxic respiratory failure secondary to Covid infection.  She is being admitted to the hospital for evaluation and treatment..  ED Course: Temperature is 99.6 blood pressure 157/89 pulse 109 respirate 36 oxygen sats 87% on room air and 95% on 2 L.  CBC and chemistry appear to be within normal LDH 168 triglyceride 58 ferritin 154 CRP 4.4 and lactic acid 0.7.  Chest x-ray showed emphysema with minimal patchy airspace disease right greater than left.  CT angiogram of the chest showed no PE.  Severe emphysema.  Patient is being admitted with acute hypoxia secondary to COVID-19 infection.  Review of Systems: As per HPI otherwise 10 point review of systems negative.    Past Medical History:  Diagnosis Date  . Anxiety   . COPD (chronic obstructive pulmonary disease) (HCC)   . Diverticulitis   . Migraines     Past Surgical History:  Procedure Laterality Date  . CESAREAN SECTION    . STAPLING OF BLEBS Left 10/31/2014   Procedure: STAPLING OF BLEBS;  Surgeon: Alleen Borne, MD;  Location: [facility] OR;  Service: Thoracic;  Laterality: Left;  Marland Kitchen VIDEO ASSISTED THORACOSCOPY Left 10/31/2014   Procedure: LEFT VIDEO ASSISTED THORACOSCOPY;  Surgeon: Alleen Borne, MD;  Location: [facility] OR;  Service: Thoracic;   Laterality: Left;     reports that she has quit smoking. Her smoking use included cigarettes. She started smoking about 22 months ago. She smoked 5.00 packs per day. She has never used smokeless tobacco. She reports that she does not drink alcohol and does not use drugs.  No Known Allergies  Family History  Problem Relation Age of Onset  . Lung cancer Father   . Lung cancer Brother        type?  . Colon cancer Neg Hx   . Breast cancer Neg Hx   . CAD Neg Hx   . Diabetes Neg Hx      Prior to Admission medications   Medication Sig Start Date End Date Taking? Authorizing Provider  albuterol (PROVENTIL) (2.5 MG/3ML) 0.083% nebulizer solution USE 1 VIAL BY NEBULIZATION EVERY 4 HOURS AS NEEDED FOR WHEEZING OR SHORTNESS OF BREATH. 08/02/19  Yes Hoy Register, MD  aspirin 325 MG tablet Take 325 mg by mouth daily.   Yes [provider]  Budeson-Glycopyrrol-Formoterol (BREZTRI AEROSPHERE) 160-9-4.8 MCG/ACT AERO Inhale 2 puffs into the lungs 2 (two) times daily. 03/30/19  Yes Nyoka Cowden, MD  buPROPion [facility] SR) 150 MG 12 hr tablet Take 1 tablet (150 mg total) by mouth 2 (two) times daily. 02/28/19  Yes Claiborne Rigg, NP  cetirizine (ZYRTEC) 10 MG tablet Take 1 tablet (10 mg total) by mouth daily. 04/27/19  Yes Hoy Register, MD  acetaminophen (TYLENOL) 500 MG tablet Take 1  tablet (500 mg total) by mouth every 6 (six) hours as needed. 03/18/16   Loletta Specter, PA-C  albuterol (VENTOLIN HFA) 108 (90 Base) MCG/ACT inhaler Inhale 1-2 puffs into the lungs every 6 (six) hours as needed for wheezing or shortness of breath. Patient not taking: Reported on 03/02/2020 02/28/19   Claiborne Rigg, NP  atorvastatin (LIPITOR) 20 MG tablet TAKE 1 TABLET (20 MG TOTAL) BY MOUTH DAILY AT 6 PM. Patient not taking: Reported on 03/02/2020 04/05/19   Hoy Register, MD  diclofenac sodium (VOLTAREN) 1 % GEL Apply 2 g topically 4 (four) times daily. 01/01/16   Langeland, Kathaleen Grinder, MD  fluticasone  (FLONASE) 50 MCG/ACT nasal spray Place 2 sprays into both nostrils daily. 04/27/19   Hoy Register, MD  gabapentin (NEURONTIN) 300 MG capsule Take 1 capsule (300 mg total) by mouth at bedtime. 01/01/16   Pete Glatter, MD  hydrOXYzine (ATARAX/VISTARIL) 10 MG tablet Take 1 tablet (10 mg total) by mouth every 4 (four) hours as needed. 12/24/18   Nyoka Cowden, MD  ibuprofen (ADVIL,MOTRIN) 200 MG tablet Take 400 mg by mouth every 6 (six) hours as needed for headache or moderate pain.    [provider]  Misc. Devices MISC Nebulizer DX: COPD J44.9 10/12/18   Hoy Register, MD  phenol (CHLORASEPTIC) 1.4 % LIQD Use as directed 1 spray in the mouth or throat as needed for throat irritation / pain. 12/29/16   Lizbeth Bark, FNP  traZODone (DESYREL) 50 MG tablet Take 0.5-1 tablets (25-50 mg total) by mouth at bedtime as needed for sleep. 12/29/16   Lizbeth Bark, FNP    Physical Exam: Vitals:   03/02/20 1900 03/02/20 1930 03/02/20 2000 03/02/20 2122  BP: (!) 148/93 (!) 157/89 (!) 151/88 128/87  Pulse: 94 93 90 (!) 102  Resp: 13 16 (!) 23 (!) 24  Temp:      TempSrc:      SpO2: 96% 100% 100% 97%      Constitutional: Very anxious, mild distress Vitals:   03/02/20 1900 03/02/20 1930 03/02/20 2000 03/02/20 2122  BP: (!) 148/93 (!) 157/89 (!) 151/88 128/87  Pulse: 94 93 90 (!) 102  Resp: 13 16 (!) 23 (!) 24  Temp:      TempSrc:      SpO2: 96% 100% 100% 97%   Eyes: PERRL, lids and conjunctivae normal ENMT: Mucous membranes are moist. Posterior pharynx clear of any exudate or lesions.Normal dentition.  Neck: normal, supple, no masses, no thyromegaly Respiratory: Coarse breath sounds with some mild expiratory wheezing. Normal respiratory effort. No accessory muscle use.  Cardiovascular: Sinus tachycardia, no murmurs / rubs / gallops. No extremity edema. 2+ pedal pulses. No carotid bruits.  Abdomen: no tenderness, no masses palpated. No hepatosplenomegaly. Bowel  sounds positive.  Musculoskeletal: no clubbing / cyanosis. No joint deformity upper and lower extremities. Good ROM, no contractures. Normal muscle tone.  Skin: no rashes, lesions, ulcers. No induration Neurologic: CN 2-12 grossly intact. Sensation intact, DTR normal. Strength 5/5 in all 4.  Psychiatric: Normal judgment and insight. Alert and oriented x 3.  Anxious mood.     Labs on Admission: I have personally reviewed following labs and imaging studies  CBC: Recent Labs  Lab 03/02/20 1812  WBC 4.5  NEUTROABS 3.4  HGB 14.1  HCT 42.7  MCV 85.1  PLT 179   Basic Metabolic Panel: Recent Labs  Lab 03/02/20 1812  NA 136  K 4.2  CL 99  CO2  24  GLUCOSE 97  BUN 14  CREATININE 0.78  CALCIUM 9.0   GFR: CrCl cannot be calculated (Unknown ideal weight.). Liver Function Tests: Recent Labs  Lab 03/02/20 1812  AST 34  ALT 24  ALKPHOS 92  BILITOT 0.7  PROT 7.5  ALBUMIN 3.7   No results for input(s): LIPASE, AMYLASE in the last 168 hours. No results for input(s): AMMONIA in the last 168 hours. Coagulation Profile: No results for input(s): INR, PROTIME in the last 168 hours. Cardiac Enzymes: No results for input(s): CKTOTAL, CKMB, CKMBINDEX, TROPONINI in the last 168 hours. BNP (last 3 results) No results for input(s): PROBNP in the last 8760 hours. HbA1C: No results for input(s): HGBA1C in the last 72 hours. CBG: No results for input(s): GLUCAP in the last 168 hours. Lipid Profile: No results for input(s): CHOL, HDL, LDLCALC, TRIG, CHOLHDL, LDLDIRECT in the last 72 hours. Thyroid Function Tests: No results for input(s): TSH, T4TOTAL, FREET4, T3FREE, THYROIDAB in the last 72 hours. Anemia Panel: No results for input(s): VITAMINB12, FOLATE, FERRITIN, TIBC, IRON, RETICCTPCT in the last 72 hours. Urine analysis:    Component Value Date/Time   COLORURINE YELLOW 05/11/2010 1639   APPEARANCEUR CLOUDY (A) 05/11/2010 1639   LABSPEC 1.008 05/11/2010 1639   PHURINE 6.0  05/11/2010 1639   GLUCOSEU NEGATIVE 05/11/2010 1639   HGBUR TRACE (A) 05/11/2010 1639   BILIRUBINUR NEGATIVE 05/11/2010 1639   KETONESUR NEGATIVE 05/11/2010 1639   PROTEINUR NEGATIVE 05/11/2010 1639   UROBILINOGEN 0.2 05/11/2010 1639   NITRITE NEGATIVE 05/11/2010 1639   LEUKOCYTESUR NEGATIVE 05/11/2010 1639   Sepsis Labs: @LABRCNTIP (procalcitonin:4,lacticidven:4) )No results found for this or any previous visit (from the past 240 hour(s)).   Radiological Exams on Admission: CT Angio Chest PE W/Cm &/Or Wo Cm  Result Date: 03/02/2020 CLINICAL DATA:  Cough, shortness of breath, COVID positive. EXAM: CT ANGIOGRAPHY CHEST WITH CONTRAST TECHNIQUE: Multidetector CT imaging of the chest was performed using the standard protocol during bolus administration of intravenous contrast. Multiplanar CT image reconstructions and MIPs were obtained to evaluate the vascular anatomy. CONTRAST:  03/04/2020 OMNIPAQUE IOHEXOL 350 MG/ML SOLN COMPARISON:  11/08/2014 FINDINGS: Cardiovascular: Exam is limited by respiratory motion artifact. Within that limitation, there is no central pulmonary embolus. The size of the main pulmonary artery is normal. Heart size is normal, with no pericardial effusion. The course and caliber of the aorta are normal. There is mild atherosclerotic calcification. Opacification decreased due to pulmonary arterial phase contrast bolus timing. Mediastinum/Nodes: -- No mediastinal lymphadenopathy. -- No hilar lymphadenopathy. -- No axillary lymphadenopathy. -- No supraclavicular lymphadenopathy. -- Normal thyroid gland where visualized. -  Unremarkable esophagus. Lungs/Pleura: There are severe emphysematous changes bilaterally. Examination of the lung fields is limited by respiratory motion artifact. There is no pneumothorax. No large pleural effusion or focal infiltrate. The trachea is unremarkable. There is a 4 mm pulmonary nodule in the right middle lobe that is stable from prior study. Upper Abdomen:  Contrast bolus timing is not optimized for evaluation of the abdominal organs. The visualized portions of the organs of the upper abdomen are normal. Musculoskeletal: No chest wall abnormality. No bony spinal canal stenosis. Review of the MIP images confirms the above findings. IMPRESSION: 1. Exam is limited by respiratory motion artifact. Within that limitation, there is no central pulmonary embolus. 2. There are severe emphysematous changes bilaterally. Aortic Atherosclerosis (ICD10-I70.0) and Emphysema (ICD10-J43.9). Electronically Signed   By: 01/08/2015 M.D.   On: 03/02/2020 21:09   DG Chest Port 1  View  Result Date: 03/02/2020 CLINICAL DATA:  Cough and shortness of breath. Positive household COVID exposure. History of COPD. EXAM: PORTABLE CHEST 1 VIEW COMPARISON:  Most recent radiograph 08/29/2016.  CT 11/08/2014 FINDINGS: Emphysema with paucity of apical lung markings. Minimal patchy airspace disease in the right greater than left lung base. Heart is normal in size. Unchanged mediastinal contours. Aortic atherosclerosis. No pneumothorax or large pleural effusion. No acute osseous abnormalities are seen. IMPRESSION: 1. Minimal patchy airspace disease in the right greater than left lung base, may be atelectasis or pneumonia. 2. Emphysema. Electronically Signed   By: Narda Rutherford M.D.   On: 03/02/2020 19:13      Assessment/Plan Principal Problem:   Acute respiratory failure due to COVID-19 [facility]) Active Problems:   COPD GOLD III   Morbid obesity due to excess calories (HCC)   Anxiety state   Cigarette smoker     #1 acute on chronic respiratory failure due to COVID-19 infection: Patient will be admitted with expected stay of about 2 days.  Initiate IV remdesivir, dexamethasone, oxygen, and breathing treatments.  May consider additional antibiotics and evaluation for secondary bacterial infection.  #2 COPD: Mild exacerbation.  Continue treatment as per above.  #3 tobacco abuse:  Counseling provided.  Provide nicotine patch.  #4 anxiety disorder: Patient very anxious.  Confirm on resume home regimen   DVT prophylaxis: Lovenox Code Status: Full code Family Communication: Family over the phone Disposition Plan: Home Consults called: None Admission status: Inpatient  Severity of Illness: The appropriate patient status for this patient is INPATIENT. Inpatient status is judged to be reasonable and necessary in order to provide the required intensity of service to ensure the patient's safety. The patient's presenting symptoms, physical exam findings, and initial radiographic and laboratory data in the context of their chronic comorbidities is felt to place them at high risk for further clinical deterioration. Furthermore, it is not anticipated that the patient will be medically stable for discharge from the hospital within 2 midnights of admission. The following factors support the patient status of inpatient.   " The patient's presenting symptoms include shortness of breath and wheeze. " The worrisome physical exam findings include anxious with coarse breath sounds. " The initial radiographic and laboratory data are worrisome because of possible pneumonia. " The chronic co-morbidities include COPD.   * I certify that at the point of admission it is my clinical judgment that the patient will require inpatient hospital care spanning beyond 2 midnights from the point of admission due to high intensity of service, high risk for further deterioration and high frequency of surveillance required.Lonia Blood MD [facility] Pager [phone]  If 7PM-7AM, please contact night-coverage [email or web address] Password [facility]  03/02/2020, 10:16 PM

## 2020-03-02 NOTE — ED Notes (Signed)
Pt called out and states she cannot breath. Upon assessment, pts RR was increased to high 20s and o2sat was 100% on RA. Pt encouraged to take deep breaths. Pt transitioned to tripod position. Lung sounds clear. PA Vernona Rieger made aware.

## 2020-03-03 ENCOUNTER — Encounter (HOSPITAL_COMMUNITY): Payer: Self-pay | Admitting: Internal Medicine

## 2020-03-03 DIAGNOSIS — U071 COVID-19: Principal | ICD-10-CM

## 2020-03-03 DIAGNOSIS — F411 Generalized anxiety disorder: Secondary | ICD-10-CM | POA: Diagnosis not present

## 2020-03-03 DIAGNOSIS — J9600 Acute respiratory failure, unspecified whether with hypoxia or hypercapnia: Secondary | ICD-10-CM

## 2020-03-03 DIAGNOSIS — F17210 Nicotine dependence, cigarettes, uncomplicated: Secondary | ICD-10-CM | POA: Diagnosis not present

## 2020-03-03 DIAGNOSIS — R0602 Shortness of breath: Secondary | ICD-10-CM | POA: Diagnosis not present

## 2020-03-03 DIAGNOSIS — J449 Chronic obstructive pulmonary disease, unspecified: Secondary | ICD-10-CM

## 2020-03-03 LAB — CBC WITH DIFFERENTIAL/PLATELET
Abs Immature Granulocytes: 0.01 10*3/uL (ref 0.00–0.07)
Basophils Absolute: 0 10*3/uL (ref 0.0–0.1)
Basophils Relative: 0 %
Eosinophils Absolute: 0 10*3/uL (ref 0.0–0.5)
Eosinophils Relative: 0 %
HCT: 38.8 % (ref 36.0–46.0)
Hemoglobin: 12.7 g/dL (ref 12.0–15.0)
Immature Granulocytes: 1 %
Lymphocytes Relative: 24 %
Lymphs Abs: 0.5 10*3/uL — ABNORMAL LOW (ref 0.7–4.0)
MCH: 27.8 pg (ref 26.0–34.0)
MCHC: 32.7 g/dL (ref 30.0–36.0)
MCV: 84.9 fL (ref 80.0–100.0)
Monocytes Absolute: 0.1 10*3/uL (ref 0.1–1.0)
Monocytes Relative: 4 %
Neutro Abs: 1.6 10*3/uL — ABNORMAL LOW (ref 1.7–7.7)
Neutrophils Relative %: 71 %
Platelets: 168 10*3/uL (ref 150–400)
RBC: 4.57 MIL/uL (ref 3.87–5.11)
RDW: 13.5 % (ref 11.5–15.5)
WBC: 2.2 10*3/uL — ABNORMAL LOW (ref 4.0–10.5)
nRBC: 0 % (ref 0.0–0.2)

## 2020-03-03 LAB — COMPREHENSIVE METABOLIC PANEL
ALT: 21 U/L (ref 0–44)
AST: 26 U/L (ref 15–41)
Albumin: 3.3 g/dL — ABNORMAL LOW (ref 3.5–5.0)
Alkaline Phosphatase: 80 U/L (ref 38–126)
Anion gap: 10 (ref 5–15)
BUN: 14 mg/dL (ref 8–23)
CO2: 21 mmol/L — ABNORMAL LOW (ref 22–32)
Calcium: 8.3 mg/dL — ABNORMAL LOW (ref 8.9–10.3)
Chloride: 106 mmol/L (ref 98–111)
Creatinine, Ser: 0.64 mg/dL (ref 0.44–1.00)
GFR, Estimated: >60 mL/min (ref >60)
Glucose, Bld: 145 mg/dL — ABNORMAL HIGH (ref 70–99)
Potassium: 4 mmol/L (ref 3.5–5.1)
Sodium: 137 mmol/L (ref 135–145)
Total Bilirubin: 0.6 mg/dL (ref 0.3–1.2)
Total Protein: 6.5 g/dL (ref 6.5–8.1)

## 2020-03-03 LAB — D-DIMER, QUANTITATIVE: D-Dimer, Quant: <0.27 ug/mL-FEU (ref 0.00–0.50)

## 2020-03-03 LAB — FERRITIN: Ferritin: 162 ng/mL (ref 11–307)

## 2020-03-03 LAB — MAGNESIUM: Magnesium: 1.8 mg/dL (ref 1.7–2.4)

## 2020-03-03 LAB — PHOSPHORUS: Phosphorus: 3.2 mg/dL (ref 2.5–4.6)

## 2020-03-03 LAB — HIV ANTIBODY (ROUTINE TESTING W REFLEX): HIV Screen 4th Generation wRfx: NONREACTIVE

## 2020-03-03 LAB — C-REACTIVE PROTEIN: CRP: 4.4 mg/dL — ABNORMAL HIGH (ref <1.0)

## 2020-03-03 MED ORDER — BARICITINIB 2 MG PO TABS
4.0000 mg | ORAL_TABLET | Freq: Every day | ORAL | Status: DC
  Administered 2020-03-03: 4 mg via ORAL
  Filled 2020-03-03: qty 2

## 2020-03-03 MED ORDER — GUAIFENESIN-DM 100-10 MG/5ML PO SYRP: 10.0000 mL | ORAL_SOLUTION | ORAL | 0 refills | Status: DC | PRN

## 2020-03-03 MED ORDER — LORAZEPAM 2 MG/ML IJ SOLN
0.5000 mg | Freq: Once | INTRAMUSCULAR | Status: AC
  Administered 2020-03-03: 0.5 mg via INTRAVENOUS
  Filled 2020-03-03: qty 1

## 2020-03-03 MED ORDER — HYDROXYZINE HCL 10 MG PO TABS: 10.0000 mg | ORAL_TABLET | Freq: Four times a day (QID) | ORAL | Status: DC | PRN

## 2020-03-03 MED ORDER — IPRATROPIUM-ALBUTEROL 20-100 MCG/ACT IN AERS
1.0000 | INHALATION_SPRAY | Freq: Four times a day (QID) | RESPIRATORY_TRACT | Status: DC
  Administered 2020-03-03 (×2): 1 via RESPIRATORY_TRACT
  Filled 2020-03-03: qty 4

## 2020-03-03 MED ORDER — BUDESON-GLYCOPYRROL-FORMOTEROL 160-9-4.8 MCG/ACT IN AERO: 2.0000 | INHALATION_SPRAY | Freq: Two times a day (BID) | RESPIRATORY_TRACT | Status: DC

## 2020-03-03 MED ORDER — BUPROPION HCL ER (SR) 150 MG PO TB12
150.0000 mg | ORAL_TABLET | Freq: Two times a day (BID) | ORAL | Status: DC
  Administered 2020-03-03: 150 mg via ORAL
  Filled 2020-03-03: qty 1

## 2020-03-03 MED ORDER — ZINC SULFATE 220 (50 ZN) MG PO CAPS
220.0000 mg | ORAL_CAPSULE | Freq: Every day | ORAL | Status: DC
  Administered 2020-03-03: 220 mg via ORAL
  Filled 2020-03-03: qty 1

## 2020-03-03 MED ORDER — OMEPRAZOLE MAGNESIUM 20 MG PO TBEC: 20.0000 mg | DELAYED_RELEASE_TABLET | Freq: Every day | ORAL | 0 refills | Status: DC

## 2020-03-03 MED ORDER — FLUTICASONE PROPIONATE 50 MCG/ACT NA SUSP
2.0000 | Freq: Every day | NASAL | Status: DC
  Administered 2020-03-03: 2 via NASAL
  Filled 2020-03-03: qty 16

## 2020-03-03 MED ORDER — ACETAMINOPHEN 500 MG PO TABS: 500.0000 mg | ORAL_TABLET | Freq: Four times a day (QID) | ORAL | Status: DC | PRN

## 2020-03-03 MED ORDER — DEXAMETHASONE 4 MG PO TABS
6.0000 mg | ORAL_TABLET | ORAL | Status: DC
  Administered 2020-03-03: 6 mg via ORAL
  Filled 2020-03-03: qty 2

## 2020-03-03 MED ORDER — ASCORBIC ACID 500 MG PO TABS: 500.0000 mg | ORAL_TABLET | Freq: Every day | ORAL | 0 refills | Status: AC

## 2020-03-03 MED ORDER — DEXAMETHASONE 6 MG PO TABS: 6.0000 mg | ORAL_TABLET | ORAL | 0 refills | Status: DC

## 2020-03-03 MED ORDER — BARICITINIB 2 MG PO TABS: 2.0000 mg | ORAL_TABLET | Freq: Every day | ORAL | Status: DC

## 2020-03-03 MED ORDER — UMECLIDINIUM BROMIDE 62.5 MCG/INH IN AEPB
1.0000 | INHALATION_SPRAY | Freq: Every day | RESPIRATORY_TRACT | Status: DC
  Filled 2020-03-03: qty 7

## 2020-03-03 MED ORDER — ASCORBIC ACID 500 MG PO TABS
500.0000 mg | ORAL_TABLET | Freq: Every day | ORAL | Status: DC
  Administered 2020-03-03: 500 mg via ORAL
  Filled 2020-03-03: qty 1

## 2020-03-03 MED ORDER — ATORVASTATIN CALCIUM 20 MG PO TABS: 20.0000 mg | ORAL_TABLET | Freq: Every day | ORAL | Status: DC

## 2020-03-03 MED ORDER — DICLOFENAC SODIUM 1 % TD GEL
2.0000 g | Freq: Four times a day (QID) | TRANSDERMAL | Status: DC
  Administered 2020-03-03: 2 g via TOPICAL
  Filled 2020-03-03: qty 100

## 2020-03-03 MED ORDER — DEXAMETHASONE 2 MG PO TABS: 6.0000 mg | ORAL_TABLET | ORAL | Status: DC

## 2020-03-03 MED ORDER — ENOXAPARIN SODIUM 40 MG/0.4ML ~~LOC~~ SOLN
40.0000 mg | SUBCUTANEOUS | Status: DC
  Administered 2020-03-03: 40 mg via SUBCUTANEOUS
  Filled 2020-03-03: qty 0.4

## 2020-03-03 MED ORDER — LORATADINE 10 MG PO TABS
10.0000 mg | ORAL_TABLET | Freq: Every day | ORAL | Status: DC
  Administered 2020-03-03: 10 mg via ORAL
  Filled 2020-03-03: qty 1

## 2020-03-03 MED ORDER — FLUTICASONE FUROATE-VILANTEROL 100-25 MCG/INH IN AEPB
1.0000 | INHALATION_SPRAY | Freq: Every day | RESPIRATORY_TRACT | Status: DC
  Filled 2020-03-03: qty 28

## 2020-03-03 MED ORDER — ZINC SULFATE 220 (50 ZN) MG PO CAPS: 220.0000 mg | ORAL_CAPSULE | Freq: Every day | ORAL | 0 refills | Status: AC

## 2020-03-03 NOTE — Progress Notes (Signed)
   03/03/20 0207  Assess: MEWS Score  Temp 98.3 F (36.8 C)  BP (!) 145/87  Pulse Rate 95  Resp (!) 27  Level of Consciousness Alert  SpO2 97 %  O2 Device Nasal Cannula  O2 Flow Rate (L/min) 2 L/min  Assess: MEWS Score  MEWS Temp 0  MEWS Systolic 0  MEWS Pulse 0  MEWS RR 2  MEWS LOC 0  MEWS Score 2  MEWS Score Color Yellow  Assess: if the MEWS score is Yellow or Red  Were vital signs taken at a resting state? No  Focused Assessment No change from prior assessment  Early Detection of Sepsis Score *See Row Information* Low  MEWS guidelines implemented *See Row Information* No, previously yellow, continue vital signs every 4 hours  Treat  MEWS Interventions Escalated (See documentation below)  Pain Scale 0-10  Pain Score 0  Complains of Anxiety  Take Vital Signs  Increase Vital Sign Frequency  Yellow: Q 2hr X 2 then Q 4hr X 2, if remains yellow, continue Q 4hrs  Escalate  MEWS: Escalate Yellow: discuss with charge nurse/RN and consider discussing with provider and RRT  Notify: Charge Nurse/RN  Name of Charge Nurse/RN Notified Renee RN  Date Charge Nurse/RN Notified 03/03/20  Time Charge Nurse/RN Notified 0210  Notify: Provider  Provider Name/Title [facility]  Date Provider Notified 03/03/20  Time Provider Notified 0210  Notification Type Page  Notification Reason Change in status  Response Other (Comment) (no responseby [email or web address] page [email or web address] [place])  Document  Patient Outcome Stabilized after interventions  Progress note created (see row info) Yes

## 2020-03-03 NOTE — Discharge Summary (Signed)
Physician Discharge Summary  [name] [name] ZOX:[id number] DOB: 04-Feb-1951 DOA: 03/02/2020  PCP: Hoy Register, MD  Admit date: 03/02/2020 Discharge date: 03/03/2020  Admitted From: Home  Discharge disposition: Home   Recommendations for Outpatient Follow-Up:   . Follow up with your primary care provider in one week.  . Check CBC, BMP, magnesium in the next visit   Discharge Diagnosis:   Principal Problem:   Acute respiratory failure due to COVID-19 [facility]) Active Problems:   COPD GOLD III   Morbid obesity due to excess calories (HCC)   Anxiety state   Cigarette smoker    Discharge Condition: Improved.  Diet recommendation: Low sodium, heart healthy.    Wound care: None.  Code status: Full.   History of Present Illness:   [name] Fabrizio Combsis a 70 y.o.femalewith medical history significant ofCOPD, diverticular disease, anxiety disorder, tobacco abuse presented to the hospital with shortness of breath for 7 days.  He was initially noted to be hypoxic in the ED and was admitted to hospital.  CBC and chemistry appear to be within normal LDH 168 triglyceride 58 ferritin 154 CRP 4.4 and lactic acid 0.7. Chest x-ray showed emphysema with minimal patchy airspace disease right greater than left. CT angiogram of the chest showed no PE or definite infiltrate. Severe emphysema. Patient was being admitted to the hospital with acute hypoxia secondary to COVID-19 infection.  Hospital Course:   Following conditions were addressed during hospitalization as listed below,  Acute respiratory failure due to COVID-19 infection:  Patient was initially on supplemental oxygen which has been weaned off at this time.  Patient was started on  IV remdesivir, dexamethasone, supplemental oxygen and inhalers.  CTA chest without definite pneumonia.  Mild had mildly elevated CRP mild leukopenia.   Procalcitonin less than 0.10. hold baricitinib.  And was subsequently weaned off oxygen.  She did  not wish to pursue further treatment in the hospital but was clinically stable.  She was advised to seek medical attention for worsening symptoms.  She will be prescribed prednisone on discharge.  COPD: Gold 3 at baseline.    At this time.  Without supplemental oxygen.  Ongoing tobacco use disorder.    Was advised to quit smoking.  anxiety disorder:Resume home medication.  Disposition.  At this time, patient is stable for disposition home with  outpatient PCP follow-up.  Medical Consultants:    None.  Procedures:    None Subjective:   Today, patient was seen and examined at bedside.  Patient feels much better than when she came in.  She is off oxygen.  She strongly wishes to go home.  Discharge Exam:   Vitals:   03/03/20 0207 03/03/20 0408  BP: (!) 145/87 115/75  Pulse: 95 80  Resp: (!) 27 15  Temp: 98.3 F (36.8 C) 98.4 F (36.9 C)  SpO2: 97% 94%   Vitals:   03/03/20 0140 03/03/20 0207 03/03/20 0408 03/03/20 0500  BP: 131/86 (!) 145/87 115/75   Pulse: 95 95 80   Resp: (!) 30 (!) 27 15   Temp:  98.3 F (36.8 C) 98.4 F (36.9 C)   TempSrc:  Oral    SpO2: 99% 97% 94%   Weight:    80.4 kg   Body mass index is 29.5 kg/m.   General: Alert awake, not in obvious distress, on room air HENT: pupils equally reacting to light,  No scleral pallor or icterus noted. Oral mucosa is moist.  Chest:  Diminished breath sounds bilaterally. No  crackles or wheezes.  CVS: S1 &S2 heard. No murmur.  Regular rate and rhythm. Abdomen: Soft, nontender, nondistended.  Bowel sounds are heard.   Extremities: No cyanosis, clubbing or edema.  Peripheral pulses are palpable. Psych: Alert, awake and oriented, normal mood CNS:  No cranial nerve deficits.  Power equal in all extremities.   Skin: Warm and dry.  No rashes noted.  The results of significant diagnostics from this hospitalization (including imaging, microbiology, ancillary and laboratory) are listed below for reference.      Diagnostic Studies:   CT Angio Chest PE W/Cm &/Or Wo Cm  Result Date: 03/02/2020 CLINICAL DATA:  Cough, shortness of breath, COVID positive. EXAM: CT ANGIOGRAPHY CHEST WITH CONTRAST TECHNIQUE: Multidetector CT imaging of the chest was performed using the standard protocol during bolus administration of intravenous contrast. Multiplanar CT image reconstructions and MIPs were obtained to evaluate the vascular anatomy. CONTRAST:  OMNIPAQUE IOHEXOL 350 MG/ML SOLN COMPARISON:  11/08/2014 FINDINGS: Cardiovascular: Exam is limited by respiratory motion artifact. Within that limitation, there is no central pulmonary embolus. The size of the main pulmonary artery is normal. Heart size is normal, with no pericardial effusion. The course and caliber of the aorta are normal. There is mild atherosclerotic calcification. Opacification decreased due to pulmonary arterial phase contrast bolus timing. Mediastinum/Nodes: -- No mediastinal lymphadenopathy. -- No hilar lymphadenopathy. -- No axillary lymphadenopathy. -- No supraclavicular lymphadenopathy. -- Normal thyroid gland where visualized. -  Unremarkable esophagus. Lungs/Pleura: There are severe emphysematous changes bilaterally. Examination of the lung fields is limited by respiratory motion artifact. There is no pneumothorax. No large pleural effusion or focal infiltrate. The trachea is unremarkable. There is a 4 mm pulmonary nodule in the right middle lobe that is stable from prior study. Upper Abdomen: Contrast bolus timing is not optimized for evaluation of the abdominal organs. The visualized portions of the organs of the upper abdomen are normal. Musculoskeletal: No chest wall abnormality. No bony spinal canal stenosis. Review of the MIP images confirms the above findings. IMPRESSION: 1. Exam is limited by respiratory motion artifact. Within that limitation, there is no central pulmonary embolus. 2. There are severe emphysematous changes bilaterally.  Aortic Atherosclerosis (ICD10-I70.0) and Emphysema (ICD10-J43.9). Electronically Signed   By: Katherine Mantle M.D.   On: 03/02/2020 21:09   DG Chest Port 1 View  Result Date: 03/02/2020 CLINICAL DATA:  Cough and shortness of breath. Positive household COVID exposure. History of COPD. EXAM: PORTABLE CHEST 1 VIEW COMPARISON:  Most recent radiograph 08/29/2016.  CT 11/08/2014 FINDINGS: Emphysema with paucity of apical lung markings. Minimal patchy airspace disease in the right greater than left lung base. Heart is normal in size. Unchanged mediastinal contours. Aortic atherosclerosis. No pneumothorax or large pleural effusion. No acute osseous abnormalities are seen. IMPRESSION: 1. Minimal patchy airspace disease in the right greater than left lung base, may be atelectasis or pneumonia. 2. Emphysema. Electronically Signed   By: Narda Rutherford M.D.   On: 03/02/2020 19:13     Labs:   Basic Metabolic Panel: Recent Labs  Lab 03/02/20 1812 03/03/20 0410  NA 136 137  K 4.2 4.0  CL 99 106  CO2 24 21*  GLUCOSE 97 145*  BUN 14 14  CREATININE 0.78 0.64  CALCIUM 9.0 8.3*  MG  --  1.8  PHOS  --  3.2   GFR CrCl cannot be calculated (Unknown ideal weight.). Liver Function Tests: Recent Labs  Lab 03/02/20 1812 03/03/20 0410  AST 34 26  ALT 24  21  ALKPHOS 92 80  BILITOT 0.7 0.6  PROT 7.5 6.5  ALBUMIN 3.7 3.3*   No results for input(s): LIPASE, AMYLASE in the last 168 hours. No results for input(s): AMMONIA in the last 168 hours. Coagulation profile No results for input(s): INR, PROTIME in the last 168 hours.  CBC: Recent Labs  Lab 03/02/20 1812 03/03/20 0410  WBC 4.5 2.2*  NEUTROABS 3.4 1.6*  HGB 14.1 12.7  HCT 42.7 38.8  MCV 85.1 84.9  PLT 179 168   Cardiac Enzymes: No results for input(s): CKTOTAL, CKMB, CKMBINDEX, TROPONINI in the last 168 hours. BNP: Invalid input(s): POCBNP CBG: No results for input(s): GLUCAP in the last 168 hours. D-Dimer Recent Labs     03/02/20 2137 03/03/20 0410  DDIMER 0.45 <0.27   Hgb A1c No results for input(s): HGBA1C in the last 72 hours. Lipid Profile Recent Labs    03/02/20 2137  TRIG 58   Thyroid function studies No results for input(s): TSH, T4TOTAL, T3FREE, THYROIDAB in the last 72 hours.  Invalid input(s): FREET3 Anemia work up Recent Labs    03/02/20 2137 03/03/20 0410  FERRITIN 154 162   Microbiology No results found for this or any previous visit (from the past 240 hour(s)).   Discharge Instructions:   Discharge Instructions    Diet - low sodium heart healthy   Complete by: As directed    Discharge instructions   Complete by: As directed    Follow-up with your primary care provider in 1 week.  Complete course of steroids. Do not overexert.  Remain in isolation for  10 days from the day of diagnosis.  Continue to use inhalers and nebulizers at home.  If you experience worsening symptoms, please seek medical attention.   Increase activity slowly   Complete by: As directed      Allergies as of 03/03/2020   No Known Allergies     Medication List    TAKE these medications   acetaminophen 500 MG tablet Commonly known as: TYLENOL Take 1 tablet (500 mg total) by mouth every 6 (six) hours as needed.   albuterol (2.5 MG/3ML) 0.083% nebulizer solution Commonly known as: PROVENTIL USE 1 VIAL BY NEBULIZATION EVERY 4 HOURS AS NEEDED FOR WHEEZING OR SHORTNESS OF BREATH.   ascorbic acid 500 MG tablet Commonly known as: VITAMIN C Take 1 tablet (500 mg total) by mouth daily. Start taking on: March 04, 2020   aspirin 325 MG tablet Take 325 mg by mouth every 4 (four) hours as needed for mild pain, moderate pain, headache or fever.   atorvastatin 20 MG tablet Commonly known as: LIPITOR TAKE 1 TABLET (20 MG TOTAL) BY MOUTH DAILY AT 6 PM.   Breztri Aerosphere 160-9-4.8 MCG/ACT Aero Generic drug: Budeson-Glycopyrrol-Formoterol Inhale 2 puffs into the lungs 2 (two) times daily.    buPROPion 150 MG 12 hr tablet Commonly known as: Wellbutrin SR Take 1 tablet (150 mg total) by mouth 2 (two) times daily. What changed: additional instructions   cetirizine 10 MG tablet Commonly known as: ZYRTEC Take 1 tablet (10 mg total) by mouth daily. What changed:   when to take this  reasons to take this   dexamethasone 6 MG tablet Commonly known as: DECADRON Take 1 tablet (6 mg total) by mouth daily. Start taking on: March 04, 2020   diclofenac sodium 1 % Gel Commonly known as: VOLTAREN Apply 2 g topically 4 (four) times daily.   fluticasone 50 MCG/ACT nasal spray Commonly known as:  FLONASE Place 2 sprays into both nostrils daily. What changed:   when to take this  reasons to take this   guaiFENesin-dextromethorphan 100-10 MG/5ML syrup Commonly known as: ROBITUSSIN DM Take 10 mLs by mouth every 4 (four) hours as needed for cough.   hydrOXYzine 10 MG tablet Commonly known as: ATARAX/VISTARIL Take 1 tablet (10 mg total) by mouth every 4 (four) hours as needed.   ibuprofen 200 MG tablet Commonly known as: ADVIL Take 400 mg by mouth every 6 (six) hours as needed for headache or moderate pain.   omeprazole 20 MG tablet Commonly known as: PriLOSEC OTC Take 1 tablet (20 mg total) by mouth daily for 10 days.   zinc sulfate 220 (50 Zn) MG capsule Take 1 capsule (220 mg total) by mouth daily. Start taking on: March 04, 2020         Time coordinating discharge: 39 minutes  Signed:  Kedron Uno  [facility] 03/03/2020, 12:15 PM

## 2020-03-03 NOTE — Plan of Care (Signed)
  Problem: Coping: Goal: Psychosocial and spiritual needs will be supported 03/03/2020 0501 by Marla Roe, RN Outcome: Progressing 03/03/2020 0500 by Marla Roe, RN Outcome: Progressing   Problem: Respiratory: Goal: Will maintain a patent airway Outcome: Progressing Goal: Complications related to the disease process, condition or treatment will be avoided or minimized Outcome: Progressing

## 2020-03-03 NOTE — Progress Notes (Signed)
Pt from ER  hyperventilating mews 2 due to RR 27 on call  call Linton Flemings NP. New order of ativan  Given as order + effectiveness.

## 2020-03-03 NOTE — ED Notes (Signed)
ED TO INPATIENT HANDOFF REPORT  Name/Age/Gender [name] [name] 70 y.o. female  Code Status    Code Status Orders  (From admission, onward)         Start     Ordered   03/02/20 2321  Full code  Continuous        03/02/20 2320        Code Status History    Date Active Date Inactive Code Status Order ID Comments User Context   12/18/2015 0141 12/19/2015 1833 Full Code [id number]  Toy Baker, MD Inpatient   10/31/2014 1725 11/04/2014 1607 Full Code [id number]  Gaye Pollack, MD Inpatient   10/26/2014 2247 10/31/2014 1725 Full Code [id number]  Gaye Pollack, MD Inpatient   Advance Care Planning Activity      Home/SNF/Other Home  Chief Complaint Acute respiratory failure due to COVID-19 ([place]) [U07.1, J96.00]  Level of Care/Admitting Diagnosis ED Disposition    ED Disposition Condition [facility] Area: [facility] [[id number]]  Level of Care: Telemetry [5]  Admit to tele based on following criteria: Complex arrhythmia (Bradycardia/Tachycardia)  May admit patient to [facility] or [facility] if equivalent level of care is available:: Yes  Covid Evaluation: Confirmed COVID Positive  Diagnosis: Acute respiratory failure due to COVID-19 [facility]) [[id number]]  Admitting Physician: Elwyn Reach [[id number]]  Attending Physician: Elwyn Reach [[id number]]  Estimated length of stay: past midnight tomorrow  Certification:: I certify this patient will need inpatient services for at least 2 midnights       Medical History Past Medical History:  Diagnosis Date  . Anxiety   . COPD (chronic obstructive pulmonary disease) ([place])   . Diverticulitis   . Migraines     Allergies No Known Allergies  IV Location/Drains/Wounds Patient Lines/Drains/Airways Status    Active Line/Drains/Airways    Name Placement date Placement time Site Days   Peripheral IV 03/02/20 Right;Posterior Forearm 03/02/20  1841  Forearm  1          Labs/Imaging Results  for orders placed or performed during the hospital encounter of 03/02/20 (from the past 48 hour(s))  Comprehensive metabolic panel     Status: None   Collection Time: 03/02/20  6:12 PM  Result Value Ref Range   Sodium 136 135 - 145 mmol/L   Potassium 4.2 3.5 - 5.1 mmol/L   Chloride 99 98 - 111 mmol/L   CO2 24 22 - 32 mmol/L   Glucose, Bld 97 70 - 99 mg/dL    Comment: Glucose reference range applies only to samples taken after fasting for at least 8 hours.   BUN 14 8 - 23 mg/dL   Creatinine, Ser 0.78 0.44 - 1.00 mg/dL   Calcium 9.0 8.9 - 10.3 mg/dL   Total Protein 7.5 6.5 - 8.1 g/dL   Albumin 3.7 3.5 - 5.0 g/dL   AST 34 15 - 41 U/L   ALT 24 0 - 44 U/L   Alkaline Phosphatase 92 38 - 126 U/L   Total Bilirubin 0.7 0.3 - 1.2 mg/dL   GFR, Estimated >60 >60 mL/min    Comment: (NOTE) Calculated using the CKD-EPI Creatinine Equation (2021)    Anion gap 13 5 - 15    Comment: Performed at [facility], [place] [address] [place], [place] [address]  CBC with Differential     Status: None   Collection Time: 03/02/20  6:12 PM  Result Value Ref Range   WBC 4.5  4.0 - 10.5 K/uL   RBC 5.02 3.87 - 5.11 MIL/uL   Hemoglobin 14.1 12.0 - 15.0 g/dL   HCT 42.7 36.0 - 46.0 %   MCV 85.1 80.0 - 100.0 fL   MCH 28.1 26.0 - 34.0 pg   MCHC 33.0 30.0 - 36.0 g/dL   RDW 13.4 11.5 - 15.5 %   Platelets 179 150 - 400 K/uL   nRBC 0.0 0.0 - 0.2 %   Neutrophils Relative % 75 %   Neutro Abs 3.4 1.7 - 7.7 K/uL   Lymphocytes Relative 16 %   Lymphs Abs 0.7 0.7 - 4.0 K/uL   Monocytes Relative 8 %   Monocytes Absolute 0.4 0.1 - 1.0 K/uL   Eosinophils Relative 0 %   Eosinophils Absolute 0.0 0.0 - 0.5 K/uL   Basophils Relative 0 %   Basophils Absolute 0.0 0.0 - 0.1 K/uL   Immature Granulocytes 1 %   Abs Immature Granulocytes 0.05 0.00 - 0.07 K/uL    Comment: Performed at [facility], [place] [address] [place], [place] [address]  POC SARS Coronavirus 2 Ag-ED - Nasal Swab (BD  Veritor Kit)     Status: Abnormal   Collection Time: 03/02/20  7:09 PM  Result Value Ref Range   SARS Coronavirus 2 Ag POSITIVE (A) NEGATIVE    Comment: (NOTE) SARS-CoV-2 antigen PRESENT.  Positive results indicate the presence of viral antigens, but clinical correlation with patient history and other diagnostic information is necessary to determine patient infection status.  Positive results do not rule out bacterial infection or co-infection  with other viruses. False positive results are rare but can occur, and confirmatory RT-PCR testing may be appropriate in some circumstances. The expected result is Negative.  Fact Sheet for Patients: HandmadeRecipes.com.cy  Fact Sheet for Healthcare Providers: FuneralLife.at  This test is not yet approved or cleared by the [place] FDA and  has been authorized for detection and/or diagnosis of SARS-CoV-2 by FDA under an Emergency Use Authorization (EUA).  This EUA will remain in effect (meaning this test can be used) for the duration of  the COVID-19 declaration under Section 564(b)(1) of the Act, 21 U.S.C. section [phone]( b)(1), unless the authorization is terminated or revoked sooner.    Lactic acid, plasma     Status: None   Collection Time: 03/02/20  9:37 PM  Result Value Ref Range   Lactic Acid, Venous 0.7 0.5 - 1.9 mmol/L    Comment: Performed at [facility], [place] [address], [place], [place] [address]  D-dimer, quantitative     Status: None   Collection Time: 03/02/20  9:37 PM  Result Value Ref Range   D-Dimer, Quant 0.45 0.00 - 0.50 ug/mL-FEU    Comment: (NOTE) At the manufacturer cut-off value of 0.5 g/mL FEU, this assay has a negative predictive value of 95-100%.This assay is intended for use in conjunction with a clinical pretest probability (PTP) assessment model to exclude pulmonary embolism (PE) and deep venous thrombosis (DVT) in outpatients  suspected of PE or DVT. Results should be correlated with clinical presentation. Performed at [facility], [place] [address] [place], [place] [address]   Procalcitonin     Status: None   Collection Time: 03/02/20  9:37 PM  Result Value Ref Range   Procalcitonin <0.10 ng/mL    Comment:        Interpretation: PCT (Procalcitonin) <= 0.5 ng/mL: Systemic infection (sepsis) is not likely. Local bacterial infection is possible. (NOTE)  Sepsis PCT Algorithm           Lower Respiratory Tract                                      Infection PCT Algorithm    ----------------------------     ----------------------------         PCT < 0.25 ng/mL                PCT < 0.10 ng/mL          Strongly encourage             Strongly discourage   discontinuation of antibiotics    initiation of antibiotics    ----------------------------     -----------------------------       PCT 0.25 - 0.50 ng/mL            PCT 0.10 - 0.25 ng/mL               OR       >80% decrease in PCT            Discourage initiation of                                            antibiotics      Encourage discontinuation           of antibiotics    ----------------------------     -----------------------------         PCT >= 0.50 ng/mL              PCT 0.26 - 0.50 ng/mL               AND        <80% decrease in PCT             Encourage initiation of                                             antibiotics       Encourage continuation           of antibiotics    ----------------------------     -----------------------------        PCT >= 0.50 ng/mL                  PCT > 0.50 ng/mL               AND         increase in PCT                  Strongly encourage                                      initiation of antibiotics    Strongly encourage escalation           of antibiotics                                     -----------------------------  PCT <= 0.25 ng/mL                                                  OR                                        > 80% decrease in PCT                                      Discontinue / Do not initiate                                             antibiotics  Performed at [place] [address], [place], [place] [address]   Lactate dehydrogenase     Status: None   Collection Time: 03/02/20  9:37 PM  Result Value Ref Range   LDH 168 98 - 192 U/L    Comment: Performed at [facility], [place] [address], [place], [place] [address]  Ferritin     Status: None   Collection Time: 03/02/20  9:37 PM  Result Value Ref Range   Ferritin 154 11 - 307 ng/mL    Comment: Performed at [facility], [place] [address] [place], [place] [address]  Triglycerides     Status: None   Collection Time: 03/02/20  9:37 PM  Result Value Ref Range   Triglycerides 58 <150 mg/dL    Comment: Performed at [facility], [place] [address] [place], [place] [address]  Fibrinogen     Status: Abnormal   Collection Time: 03/02/20  9:37 PM  Result Value Ref Range   Fibrinogen 596 (H) 210 - 475 mg/dL    Comment: Performed at [facility], [place] [address], [place], [place] [address]  C-reactive protein     Status: Abnormal   Collection Time: 03/02/20  9:37 PM  Result Value Ref Range   CRP 4.4 (H) <1.0 mg/dL    Comment: Performed at [facility], [place] [address], [place], [place] [address]   CT Angio Chest PE W/Cm &/Or Wo Cm  Result Date: 03/02/2020 CLINICAL DATA:  Cough, shortness of breath, COVID positive. EXAM: CT ANGIOGRAPHY CHEST WITH CONTRAST TECHNIQUE: Multidetector CT imaging of the chest was performed using the standard protocol during bolus administration of intravenous contrast. Multiplanar CT image reconstructions and MIPs were obtained to evaluate the vascular anatomy. CONTRAST:  130mL OMNIPAQUE IOHEXOL 350 MG/ML SOLN  COMPARISON:  11/08/2014 FINDINGS: Cardiovascular: Exam is limited by respiratory motion artifact. Within that limitation, there is no central pulmonary embolus. The size of the main pulmonary artery is normal. Heart size is normal, with no pericardial effusion. The course and caliber of the aorta are normal. There is mild atherosclerotic calcification. Opacification decreased due to pulmonary arterial phase contrast bolus timing. Mediastinum/Nodes: -- No mediastinal lymphadenopathy. -- No hilar lymphadenopathy. -- No axillary lymphadenopathy. -- No supraclavicular lymphadenopathy. -- Normal thyroid gland where visualized. -  Unremarkable esophagus. Lungs/Pleura: There are severe emphysematous changes bilaterally. Examination of the lung  fields is limited by respiratory motion artifact. There is no pneumothorax. No large pleural effusion or focal infiltrate. The trachea is unremarkable. There is a 4 mm pulmonary nodule in the right middle lobe that is stable from prior study. Upper Abdomen: Contrast bolus timing is not optimized for evaluation of the abdominal organs. The visualized portions of the organs of the upper abdomen are normal. Musculoskeletal: No chest wall abnormality. No bony spinal canal stenosis. Review of the MIP images confirms the above findings. IMPRESSION: 1. Exam is limited by respiratory motion artifact. Within that limitation, there is no central pulmonary embolus. 2. There are severe emphysematous changes bilaterally. Aortic Atherosclerosis (ICD10-I70.0) and Emphysema (ICD10-J43.9). Electronically Signed   By: Constance Holster M.D.   On: 03/02/2020 21:09   DG Chest Port 1 View  Result Date: 03/02/2020 CLINICAL DATA:  Cough and shortness of breath. Positive household COVID exposure. History of COPD. EXAM: PORTABLE CHEST 1 VIEW COMPARISON:  Most recent radiograph 08/29/2016.  CT 11/08/2014 FINDINGS: Emphysema with paucity of apical lung markings. Minimal patchy airspace disease in the  right greater than left lung base. Heart is normal in size. Unchanged mediastinal contours. Aortic atherosclerosis. No pneumothorax or large pleural effusion. No acute osseous abnormalities are seen. IMPRESSION: 1. Minimal patchy airspace disease in the right greater than left lung base, may be atelectasis or pneumonia. 2. Emphysema. Electronically Signed   By: Keith Rake M.D.   On: 03/02/2020 19:13    Pending Labs Unresulted Labs (From admission, onward)          Start     Ordered   03/09/20 0500  Creatinine, serum  (enoxaparin (LOVENOX)    CrCl >/= 30 ml/min)  Weekly,   R     Comments: while on enoxaparin therapy    03/02/20 2320   03/03/20 0500  CBC with Differential/Platelet  Daily,   R      03/02/20 2320   03/03/20 0500  Comprehensive metabolic panel  Daily,   R      03/02/20 2320   03/03/20 0500  C-reactive protein  Daily,   R      03/02/20 2320   03/03/20 0500  D-dimer, quantitative (not at [facility])  Daily,   R      03/02/20 2320   03/03/20 0500  Phosphorus  Daily,   R      03/02/20 2320   03/03/20 0500  Magnesium  Daily,   R      03/02/20 2320   03/03/20 0500  Ferritin  Daily,   R      03/02/20 2320   03/02/20 2321  HIV Antibody (routine testing w rflx)  (HIV Antibody (Routine testing w reflex) panel)  Once,   STAT        03/02/20 2320   03/02/20 2137  Blood Culture (routine x 2)  BLOOD CULTURE X 2,   STAT      03/02/20 2137          Vitals/Pain Today's Vitals   03/02/20 2000 03/02/20 2122 03/02/20 2224 03/03/20 0140  BP: (!) 151/88 128/87 136/75 131/86  Pulse: 90 (!) 102 92 95  Resp: (!) 23 (!) 24 (!) 24 (!) 30  Temp:   98.4 F (36.9 C)   TempSrc:   Oral   SpO2: 100% 97% 97% 99%    Isolation Precautions Airborne and Contact precautions  Medications Medications  remdesivir 200 mg in sodium chloride 0.9% 250 mL IVPB (0 mg Intravenous Stopped 03/03/20 0143)  Followed by  remdesivir 100 mg in sodium chloride 0.9 % 100 mL IVPB (has no administration in  time range)  enoxaparin (LOVENOX) injection 40 mg (has no administration in time range)  Ipratropium-Albuterol (COMBIVENT) respimat 1 puff (has no administration in time range)  guaiFENesin-dextromethorphan (ROBITUSSIN DM) 100-10 MG/5ML syrup 10 mL (has no administration in time range)  chlorpheniramine-HYDROcodone (TUSSIONEX) 10-8 MG/5ML suspension 5 mL (has no administration in time range)  ascorbic acid (VITAMIN C) tablet 500 mg (has no administration in time range)  zinc sulfate capsule 220 mg (has no administration in time range)  ondansetron (ZOFRAN) tablet 4 mg (has no administration in time range)    Or  ondansetron (ZOFRAN) injection 4 mg (has no administration in time range)  dexamethasone (DECADRON) tablet 6 mg (has no administration in time range)  baricitinib (OLUMIANT) tablet 4 mg (has no administration in time range)  sodium chloride 0.9 % bolus 1,000 mL (0 mLs Intravenous Stopped 03/02/20 2007)  iohexol (OMNIPAQUE) 350 MG/ML injection 100 mL (100 mLs Intravenous Contrast Given 03/02/20 2053)  dexamethasone (DECADRON) injection 6 mg (6 mg Intravenous Given 03/02/20 2149)    Mobility walks

## 2020-03-03 NOTE — Clinical Social Work Note (Signed)
I was alerted by UR RN at 12;15 that patient needed Code 44.  I tried calling into patient's room using room phone.  No answer.  I left message for son at both numbers listed.  No call back.  I noticed that patient had d/ced and consulted with UR RN Hyacinth Meeker. She supported my idea of continuing to try to speak to son.  Tried both numbers again.  Left second message.  Will continue to try.

## 2020-03-05 ENCOUNTER — Telehealth: Payer: Self-pay

## 2020-03-05 NOTE — Telephone Encounter (Signed)
If antibiotics were not prescribed at discharge and there was no indication.  I will discuss with her further at her visit tomorrow.

## 2020-03-05 NOTE — Telephone Encounter (Signed)
Transition Care Management Follow-up Telephone Call  Call placed to patient with assistance of Spanish Interpreter # [id number]/[employer]  Date of discharge and from where: 03/03/2020, [facility]   How have you been since you were released from the hospital? She said she is weak.  She is also very concerned about " choking up in the middle of the night."  She explained that she did not know until today that she had medications to pick up at her pharmacy, so she did not take any of the new medications yesterday.  She now has them and has taken them. However, she asked multiple times if she can have an antibiotic in the event that  she needs it.  This CM explained that an antibiotic was not ordered but she said she would still like one. Informed her that this CM would notify Dr Alvis Lemmings of her concern  She is scared that she will not be able to breathe.  She was able to speak on the phone without any shortness of breath.  Reviewed with her when she should return to the ED. She said that she understands she is to remain in isolation. She has masks to wear when she is around others.   Any questions or concerns? Yes - noted above.    Items Reviewed:  Did the pt receive and understand the discharge instructions provided? Yes   Medications obtained and verified? Yes  - she said she now has all medications, including the ones she was taking for her COPD prior to the hospitalization. No questions about the medications other than the request for an antibiotic.   Other? No   Any new allergies since your discharge? No   Do you have support at home? Yes  - she said she is living alone but her family comes to check on her.  Home Care and Equipment/Supplies: Were home health services ordered? no If so, what is the name of the agency? n/a  Has the agency set up a time to come to the patient's home? not applicable Were any new equipment or medical supplies ordered?  No What is the name of the  medical supply agency? n/a Were you able to get the supplies/equipment? not applicable Do you have any questions related to the use of the equipment or supplies? No, n/a  She already has a nebulizer  Functional Questionnaire: (I = Independent and D = Dependent) ADLs: independent. But she said that her family assists her if needed.   Follow up appointments reviewed:   PCP Hospital f/u appt confirmed? Yes  - Dr Alvis Lemmings - televisit 03/06/2020 @ 1050  Specialist Hospital f/u appt confirmed? No  - none scheduled at this time  Are transportation arrangements needed? No   If their condition worsens, is the pt aware to call PCP or go to the Emergency Dept.?yes  Was the patient provided with contact information for the PCP's office or ED?provided her with the phone number for [facility]  Was to pt encouraged to call back with questions or concerns? yes

## 2020-03-05 NOTE — Telephone Encounter (Signed)
From the discharge call.  Appointment with Dr [name] [name] 03/06/2020 - televisit   She said she is weak.  She is also very concerned about " choking up in the middle of the night."  She explained that she did not know until today that she had medications to pick up at her pharmacy, so she did not take any of the new medications yesterday.  She now has them and has taken them. However, she asked multiple times if she can have an antibiotic in the event that  she needs it.  This CM explained that an antibiotic was not ordered but she said she would still like one. Informed her that this CM would notify Dr [name] [name] of her concern  She is scared that she will not be able to breathe.  She was able to speak on the phone without any shortness of breath.  Reviewed with her when she should return to the ED. She said that she understands she is to remain in isolation. She has masks to wear when she is around others.     she said she now has all medications, including the ones she was taking for her COPD prior to the hospitalization. No questions about the medications other than the request for an antibiotic.     she said she is living alone but her family comes to check on her.

## 2020-03-06 ENCOUNTER — Telehealth: Payer: Self-pay | Admitting: Internal Medicine

## 2020-03-06 ENCOUNTER — Ambulatory Visit: Payer: Medicare Other | Admitting: Family Medicine

## 2020-03-06 DIAGNOSIS — U071 COVID-19: Secondary | ICD-10-CM

## 2020-03-06 MED ORDER — PREDNISONE 10 MG PO TABS: ORAL_TABLET | ORAL | 0 refills | Status: AC

## 2020-03-06 MED ORDER — AZITHROMYCIN 250 MG PO TABS: ORAL_TABLET | ORAL | 0 refills | Status: DC

## 2020-03-06 NOTE — Telephone Encounter (Signed)
Called spoke with patient's son.  Let him know Dr. Thurston Hole recommendations Verified pharmacy Orders placed for prednisone, zpak, and MAB referral  Nothing further needed at this time.

## 2020-03-06 NOTE — Telephone Encounter (Signed)
Primary Pulmonologist: MW Last office visit and with whom: 06/27/19 with MW What do we see them for (pulmonary problems): COPD Last OV assessment/plan: Active smoker  - 11/10/2014  new maint rx = symb 160/incruse sample - Alpha one AT 11/24/2014 >  MM/ nl levels - 01/31/2015  extensive coaching HFA effectiveness =    90%  - spirometry 01/31/2015  FEV1  0.85 (37%) ratio 34  - Spirometry 08/29/2016  FEV1 0.71 (30%)  Ratio 36  > added back LAMA = incruse samples   - 06/27/2019  After extensive coaching inhaler device,  effectiveness =    90% > continue breztri    Group D in terms of symptom/risk and laba/lama/ICS  therefore appropriate rx at this point >>>  breztri appropriate  Pt informed of the seriousness of COVID 19 infection as a direct risk to lung health  and safey and to close contacts and should continue to wear a facemask in public and minimize exposure to public locations but especially avoid any area or activity where non-close contacts are not observing distancing or wearing an appropriate face mask.  I strongly recommended vaccine asap thru local drugstore.          Each maintenance medication was reviewed in detail including emphasizing most importantly the difference between maintenance and prns and under what circumstances the prns are to be triggered using an action plan format where appropriate.  Total time for H and P, chart review, counseling, teaching device and generating customized AVS unique to this office visit / charting = 20 min         Was appointment offered to patient (explain)?  No, patient's son wanted recommendations    Reason for call: Spoke with patient's son. He stated that his mother was in the ED yesterday and tested positive for COVID. She was discharged without any antibiotics or prednisone. She has a productive cough with dark green, brown phlegm. She has also noticed an increase in her SOB. She has been using her Breztri as prescrived as well  as her albuterol neb solution. He denied any fevers or chest pain. She is unvaccinated.   He believes she is in the middle of a COPD exacerbation. He is wondering of MW would be willing to send in an abx and prednisone. Pharmacy is [facility] in [place].   (examples of things to ask: : When did symptoms start? Fever? Cough? Productive? Color to sputum? More sputum than usual? Wheezing? Have you needed increased oxygen? Are you taking your respiratory medications? What over the counter measures have you tried?)  No Known Allergies  Immunization History  Administered Date(s) Administered  . Influenza,inj,Quad PF,6+ Mos 11/04/2014, 12/19/2015, 12/29/2016  . Pneumococcal Conjugate-13 12/29/2016  . Pneumococcal Polysaccharide-23 11/04/2014  . Td 02/03/2001  . Tdap 01/01/2016   MW, can you please advise? Thanks!

## 2020-03-06 NOTE — Telephone Encounter (Signed)
Be sure was referred to the MAB clinic  Prednisone 10 mg take  4 each am x 2 days,   2 each am x 2 days,  1 each am x 2 days and stop zpak  If can't keep sats > 90% on whatever 02 she has or comfortable at rest p whatever saba she has then will need to go back to er

## 2020-03-08 ENCOUNTER — Ambulatory Visit: Payer: Medicare Other

## 2020-03-08 LAB — CULTURE, BLOOD (ROUTINE X 2)
Culture: NO GROWTH
Culture: NO GROWTH
Special Requests: ADEQUATE

## 2020-03-13 ENCOUNTER — Telehealth: Payer: Self-pay | Admitting: Internal Medicine

## 2020-03-13 NOTE — Telephone Encounter (Signed)
Left message for patients son to call back.

## 2020-03-14 NOTE — Telephone Encounter (Signed)
[name] [name] son is returning phone call. [name] [name] phone number is [phone].

## 2020-03-14 NOTE — Telephone Encounter (Signed)
Called and spoke to pt's son Nancy Marus who states that pt finished abx and steroids about 1 week ago. Since finishing, pt is still having complaints of SOB, coughing up green phlegm that is very elastic, and is also wheezing.  Nancy Marus states that pt has been afebrile, with last temp being 98.4.  Pt is taking robitussin cough syrup, using the Breztri inhaler as prescribed, using her albuterol neb sol at least 4-6 times daily, and also takes ibuprofen on a daily basis just once daily.  Pt and Nancy Marus are wanting recommendations to help with pt's symptoms.  Dr. Sherene Sires, please advise.

## 2020-03-14 NOTE — Telephone Encounter (Signed)
Nothing further for today - needs televisit tomorrow if can't be worked in today with NP for same - I can add her on to my am schedule and call at lunch

## 2020-03-14 NOTE — Telephone Encounter (Signed)
Called and spoke with pts son and he is aware of televisit scheduled for tomorrow with MW>  He is aware that MW will call around lunch time tomorrow.  Nothing further is needed.

## 2020-03-15 ENCOUNTER — Encounter: Payer: Self-pay | Admitting: Internal Medicine

## 2020-03-15 ENCOUNTER — Ambulatory Visit (INDEPENDENT_AMBULATORY_CARE_PROVIDER_SITE_OTHER): Payer: 59 | Admitting: Internal Medicine

## 2020-03-15 ENCOUNTER — Other Ambulatory Visit: Payer: Self-pay

## 2020-03-15 DIAGNOSIS — J9600 Acute respiratory failure, unspecified whether with hypoxia or hypercapnia: Secondary | ICD-10-CM

## 2020-03-15 DIAGNOSIS — U071 COVID-19: Secondary | ICD-10-CM

## 2020-03-15 DIAGNOSIS — J449 Chronic obstructive pulmonary disease, unspecified: Secondary | ICD-10-CM

## 2020-03-15 MED ORDER — DOXYCYCLINE HYCLATE 100 MG PO TABS: 100.0000 mg | ORAL_TABLET | Freq: Two times a day (BID) | ORAL | 0 refills | Status: DC

## 2020-03-15 MED ORDER — PREDNISONE 10 MG PO TABS: ORAL_TABLET | ORAL | 0 refills | Status: DC

## 2020-03-15 NOTE — Progress Notes (Signed)
Subjective:    Patient ID: [name] [name], female    DOB: December 07, 1950,     MRN: [id number]    Brief patient profile:  70  yo latino MM/ctive smoker  female [employer] from [place] to 1997  With prev dx of copd aorund 2010 with doe on qid symbicort x 2013 but gradually worse then admit [facility] with [facility]X    10/31/2014 Preoperative Dx: Spontaneous left pneumothorax with persistent air leak Postoperative Dx: same Procedure: Left video-assisted thoracoscopy, stapling of apical bleb, mechanical pleurodesis   11/10/2014 1st [facility] office visit/ [name] [name]   Chief Complaint  Patient presents with  . PULMONARY CONSULT    Spontaneous Left Pneumothorax. Pt states that she has increased dyspnea with exertion. Pt c/o of occasional wet cough with yellow to clear mucus and chest pain with breathing. Pt denies any wheeze.   still way overusing saba at baseline mostly in neb form with doe x across the room and not using symbicort or pain meds correctly (see a/p) rec Plan A = Automatic = Symbiocrt 160 one twice daily and Incruse one click each am  Plan B = Backup - Only use your albuterol (Yellow/proventil) as a rescue medication Plan C = Crisis/ contingency - nebulizer, ok use with albuterol (or combination until you use it up)  every 4 hours if plan B is not working  For pain > tramadol 50 mg up to 2 every 4 hours  For nerves> alprazolam 0.5 mg up to one every 4 hours      11/24/2014  f/u ov/[name] [name] re: copd/ pfts on symb/ incruse still using saba  Chief Complaint  Patient presents with  . Follow-up    pt following for COPD: pt states she is doing pretty well, she is still having pain on her left side, but it [place] much better.  pt states her breathing has been very well. pt c/o increase SOB when she exerts herself. no c/o chest tightness. pt needs refills on meds.    doe = MMRC1 = can walk nl pace, flat grade, can't hurry or go uphills or steps s sob  rec Please remember to go to the  lab department downstairs for your tests - we will call you with the results when they are available. Symbicort 160 Take1 puffs first thing in am and then another1 puffs about 12 hours later.  Goal is not to need to rescue albuterol  much at all      01/31/2015  f/u ov/[name] [name] re: COPD III/ symbicort 160 2bid/ incruse and prn saba  Chief Complaint  Patient presents with  . Follow-up    Pt states that breathing is unchanged since last visit. Pt states that she is under a lot of stress right now which has made her breathing worse   2x daily neb but not noct/ no hfa use  Pain L cw same as post op never improved, very sensitive chest wall / not resp to tramadol so far rec Please see patient coordinator before you leave today  to schedule referral to Internal Medicine/ Dr Drue Novel  Zostrix cream 4 x daily (over the counter) - as improves can reduce to bedtime only  Plan A = Automatic = Symbiocrt 160 one twice daily and Incruse one click each am  Plan B = Backup - Only use your albuterol (Yellow/proventil) as a rescue medication Plan C = Crisis/ contingency - nebulizer, ok use with albuterol (or combination until you use it up)  every 4  hours if plan B is not working    08/29/2016  f/u ov/[name] [name] re:   COPD GOLD III/ quit smoking  02/2016 Chief Complaint  Patient presents with  . Follow-up    l/s 01/2015- pt stopped smoking X6 mos ago, noted weight gain, since then c/o increased SOB, sinus congestion, PND, hoarseness.    walking 30 min s stopping sev times a week better if use albturol first despite using symbicort 160 2bid  Does not recall response to prednisone or incruse  Easily confused with details of care / access to meds issues  Doe = MMRC2 = can't walk a nl pace on a flat grade s sob but does fine slow and flat  rec Please see patient coordinator before you leave today  to schedule referral to Internal Medicine/ Dr Drue Novel  Zostrix cream 4 x daily (over the counter) - as improves can reduce to  bedtime only  Plan A = Automatic = Symbiocrt 160 one twice daily and Incruse one click each am  Plan B = Backup - Only use your albuterol (Yellow/proventil) as a rescue medication Plan C = Crisis/ contingency - nebulizer, ok use with albuterol (or combination until you use it up)  every 4 hours if plan B is not working  Please schedule a follow up visit in 3 months but call sooner if needed - once establish with Dr Drue Novel follow up here can be as needed  Late add: confused as to whether she uses the symb one bid vs 2 bid will leave things as they are right now due to communication barriers but ideally the dose should be 2 pffs every 12 hours unless can't tolerate for some reason     12/24/2018  f/u ov/[name] [name] re: re-establish re COPD GOLD III/ resumed smoking / problems with access to meds  Chief Complaint  Patient presents with  . Consult    Patient is here for COPD. Patient has started smoking again since the virus started and want's help to stop.   Dyspnea:  Has a 15 min course around house s stopping Cough: none  Sleeping: ok flat s respiratory symptoms  SABA use: avg twice daily  02: none  rec Plan A = Automatic = Symbicort 160 Take 2 puffs first thing in am and then another 2 puffs about 12 hours later.  And when available Incruse each am  (an option that has both is called Trelegy)  Plan B = Backup - Only use your albuterol (Yellow/proventil) as a rescue medication  Plan C = Crisis/ contingency - nebulizer, ok use with albuterol (or combination until you use it up)  every 4 hours if plan B is not working       06/27/2019  f/u ov/[name] [name] re:  GOLD III/ still smoking   breztri maint Chief Complaint  Patient presents with  . Follow-up    Breathing is overall doing well. She has occ cough with white sputum.  She is using her albuterol about 2 x per day.   Dyspnea:  Walking outside x 30 min  Cough: smoker's rattle worse in am's/ min mucoid Sleeping: flat bed one bed  SABA use: twice a  day  02: none  rec The key is to stop smoking completely before smoking completely stops you! For smoking cessation info  call [phone]  Try albuterol 15 min before an activity that you know would make you short of breath  I strongly recommend you take the vaccine as soon as possible  Admit date: 03/02/2020 Discharge date: 03/03/2020  Admitted From: Home  Discharge disposition: Home   Recommendations for Outpatient Follow-Up:    Follow up with your primary care provider in one week.   Check CBC, BMP, magnesium in the next visit   Discharge Diagnosis:   Principal Problem:   Acute respiratory failure due to COVID-19 [facility]) Active Problems:   COPD GOLD III   Morbid obesity due to excess calories (HCC)   Anxiety state   Cigarette smoker    Discharge Condition: Improved.  Diet recommendation: Low sodium, heart healthy.    Wound care: None.  Code status: Full.   History of Present Illness:   [name] Lumadue Combsis a 70 y.o.femalewith medical history significant ofCOPD, diverticular disease, anxiety disorder, tobacco abuse presented to the hospital with shortness of breath for 7 days.  He was initially noted to be hypoxic in the ED and was admitted to hospital.  CBC and chemistry appear to be within normal LDH 168 triglyceride 58 ferritin 154 CRP 4.4 and lactic acid 0.7. Chest x-ray showed emphysema with minimal patchy airspace disease right greater than left. CT angiogram of the chest showed no PE or definite infiltrate. Severe emphysema. Patient was being admitted to the hospital with acute hypoxia secondary to COVID-19 infection.  Hospital Course:   Following conditions were addressed during hospitalization as listed below   Acute respiratory failure due to COVID-19 infection: Patient was initially on supplemental oxygen which has been weaned off at this time.  Patient was started on IV remdesivir, dexamethasone,supplemental oxygen and  inhalers. CTA chest without definite pneumonia. Mild had mildly elevated CRP mild leukopenia. Procalcitonin less than 0.10. hold baricitinib.  And was subsequently weaned off oxygen.  She did not wish to pursue further treatment in the hospital but was clinically stable.  She was advised to seek medical attention for worsening symptoms.  She will be prescribed prednisone on discharge.  COPD:Gold 3 at baseline.   At this time.  Without supplemental oxygen.  Ongoing tobacco use disorder.   Was advised to quit smoking.  Anxiety disorder:Resume home medication.    Virtual Visit via Telephone Note 03/15/2020   I connected with [name] [name] on 03/15/20 at 10:45 AM EST by telephone and verified that I am speaking with the correct person using two identifiers. Pt is at home and this call made from my office with son Nancy Marus serving as interpreter.   I discussed the limitations, risks, security and privacy concerns of performing an evaluation and management service by telephone and the availability of in person appointments. I also discussed with the patient that there may be a patient responsible charge related to this service. The patient expressed understanding and agreed to proceed.   History of Present Illness: gold III/ min smoking/ maint on breztri 2bid Few days after finished prednisone from admit gradually more  more doe x 10 feet and using lots of saba  Dyspnea:  As above  Cough: green mucus very thick stringy Sleeping: in bed/ couch with one pillow SABA use: mostly just nebulizer after walking but feels needs  02: 95% s oxygen    No obvious day to day or daytime variability or assoc  hemoptysis or cp or chest tightness, subjective wheeze or overt sinus or hb symptoms.    Also denies any obvious fluctuation of symptoms with weather or environmental changes or other aggravating or alleviating factors except as outlined above.   Meds reviewed/ med reconciliation completed  Current Meds  Medication Sig  . acetaminophen (TYLENOL) 500 MG tablet Take 1 tablet (500 mg total) by mouth every 6 (six) hours as needed.  Marland Kitchen albuterol (PROVENTIL) (2.5 MG/3ML) 0.083% nebulizer solution USE 1 VIAL BY NEBULIZATION EVERY 4 HOURS AS NEEDED FOR WHEEZING OR SHORTNESS OF BREATH.  Marland Kitchen ascorbic acid (VITAMIN C) 500 MG tablet Take 1 tablet (500 mg total) by mouth daily.  Marland Kitchen aspirin 325 MG tablet Take 325 mg by mouth every 4 (four) hours as needed for mild pain, moderate pain, headache or fever.  . Budeson-Glycopyrrol-Formoterol (BREZTRI AEROSPHERE) 160-9-4.8 MCG/ACT AERO Inhale 2 puffs into the lungs 2 (two) times daily.  Marland Kitchen buPROPion (WELLBUTRIN SR) 150 MG 12 hr tablet Take 1 tablet (150 mg total) by mouth 2 (two) times daily. (Patient taking differently: Take 150 mg by mouth 2 (two) times daily. For wanting to stop smoking)  . cetirizine (ZYRTEC) 10 MG tablet Take 1 tablet (10 mg total) by mouth daily. (Patient taking differently: Take 10 mg by mouth daily as needed for allergies.)  . diclofenac sodium (VOLTAREN) 1 % GEL Apply 2 g topically 4 (four) times daily.  Marland Kitchen guaiFENesin-dextromethorphan (ROBITUSSIN DM) 100-10 MG/5ML syrup Take 10 mLs by mouth every 4 (four) hours as needed for cough.  . hydrOXYzine (ATARAX/VISTARIL) 10 MG tablet Take 1 tablet (10 mg total) by mouth every 4 (four) hours as needed.  Marland Kitchen ibuprofen (ADVIL,MOTRIN) 200 MG tablet Take 400 mg by mouth every 6 (six) hours as needed for headache or moderate pain.  Marland Kitchen zinc sulfate 220 (50 Zn) MG capsule Take 1 capsule (220 mg total) by mouth daily.         Observations/Objective: Congested sounding cough   Assessment and Plan: See problem list for active a/p's   Follow Up Instructions: See avs for instructions unique to this ov which includes revised/ updated med list     I discussed the assessment and treatment plan with the patient. The patient was provided an opportunity to ask questions and all were answered.  The patient agreed with the plan and demonstrated an understanding of the instructions.   The patient was advised to call back or seek an in-person evaluation if the symptoms worsen or if the condition fails to improve as anticipated.  I provided 22  minutes of non-face-to-face time during this encounter.   Sandrea Hughs, MD

## 2020-03-15 NOTE — Assessment & Plan Note (Signed)
As of 03/15/2020 sat ok RA  Advised: Make sure you check your oxygen saturation at your highest level of activity to be sure it stays over 90% and keep track of it at least once a week, more often if breathing getting worse, and let me know if losing ground.   >>> f/u in one week with all meds in hand using a trust but verify approach to confirm accurate Medication  Reconciliation The principal here is that until we are certain that the  patients are doing what we've asked, it makes no sense to ask them to do more.   Each maintenance medication was reviewed in detail including most importantly the difference between maintenance and as needed and under what circumstances the prns are to be used.  Please see AVS for specific  Instructions which are unique to this visit and I personally typed out  which were reviewed in detail over the phone with the patient and a copy provided to son in person who will drop by office

## 2020-03-15 NOTE — Assessment & Plan Note (Signed)
Active smoker  - 11/10/2014  new maint rx = symb 160/incruse sample - Alpha one AT 11/24/2014 >  MM/ nl levels - 01/31/2015  extensive coaching HFA effectiveness =    90%  - spirometry 01/31/2015  FEV1  0.85 (37%) ratio 34  - Spirometry 08/29/2016  FEV1 0.71 (30%)  Ratio 36  > added back LAMA = incruse samples  - 06/27/2019  After extensive coaching inhaler device,  effectiveness =    90% > continue breztri   Now with flare p covid with purulent tracheobronchitis and excess use of saba.  rec mucinex dm 1200 mg bid prn  Doxy x 10 d Prednisone 10 mg take  4 each am x 2 days,   2 each am x 2 days,  1 each am x 2 days and stop   Re saba I spent extra time with pt today reviewing appropriate use of albuterol for prn use on exertion with the following points: 1) saba is for relief of sob that does not improve by walking a slower pace or resting but rather if the pt does not improve after trying this first. 2) If the pt is convinced, as many are, that saba helps recover from activity faster then it's easy to tell if this is the case by re-challenging : ie stop, take the inhaler, then p 5 minutes try the exact same activity (intensity of workload) that just caused the symptoms and see if they are substantially diminished or not after saba 3) if there is an activity that reproducibly causes the symptoms, try the saba 15 min before the activity on alternate days   If in fact the saba really does help, then fine to continue to use it prn but advised may need to look closer at the maintenance regimen being used to achieve better control of airways disease with exertion.

## 2020-03-15 NOTE — Patient Instructions (Addendum)
For cough > mucinex dm upt 1200 mg every 12 hours as needed  Doxycylcine 100 mg twice daily x 10 days  Prednisone 10 mg take  4 each am x 2 days,   2 each am x 2 days,  1 each am x 2 days and stop   Only use nebulizer if can't catch your breath at rest>  Go to ER if still short of breath at rest after your nebulizer treatment  We need to set you up for my chart today  Make an appt for a week from today

## 2020-03-15 NOTE — Addendum Note (Signed)
Addended by: Melonie [place] on: 03/15/2020 12:04 PM   Modules accepted: Orders

## 2020-03-22 ENCOUNTER — Encounter: Payer: Self-pay | Admitting: Internal Medicine

## 2020-03-22 ENCOUNTER — Ambulatory Visit (INDEPENDENT_AMBULATORY_CARE_PROVIDER_SITE_OTHER): Payer: 59 | Admitting: Internal Medicine

## 2020-03-22 ENCOUNTER — Other Ambulatory Visit: Payer: Self-pay

## 2020-03-22 DIAGNOSIS — J449 Chronic obstructive pulmonary disease, unspecified: Secondary | ICD-10-CM

## 2020-03-22 DIAGNOSIS — J9600 Acute respiratory failure, unspecified whether with hypoxia or hypercapnia: Secondary | ICD-10-CM | POA: Diagnosis not present

## 2020-03-22 DIAGNOSIS — F17210 Nicotine dependence, cigarettes, uncomplicated: Secondary | ICD-10-CM

## 2020-03-22 DIAGNOSIS — U071 COVID-19: Secondary | ICD-10-CM | POA: Diagnosis not present

## 2020-03-22 MED ORDER — BREZTRI AEROSPHERE 160-9-4.8 MCG/ACT IN AERO: 2.0000 | INHALATION_SPRAY | Freq: Two times a day (BID) | RESPIRATORY_TRACT | 0 refills | Status: DC

## 2020-03-22 MED ORDER — PREDNISONE 10 MG PO TABS: ORAL_TABLET | ORAL | 0 refills | Status: DC

## 2020-03-22 MED ORDER — ALBUTEROL SULFATE HFA 108 (90 BASE) MCG/ACT IN AERS: INHALATION_SPRAY | RESPIRATORY_TRACT | 2 refills | Status: DC

## 2020-03-22 NOTE — Patient Instructions (Addendum)
The key is to stop smoking completely before smoking completely stops you!  Plan A = Automatic = Always=    Breztri Take 2 puffs first thing in am and then another 2 puffs about 12 hours later.   Work on inhaler technique:  relax and gently blow all the way out then take a nice smooth deep breath back in, triggering the inhaler at same time you start breathing in.  Hold for up to 5 seconds if you can. Blow out thru nose. Rinse and gargle with water when done      Plan B = Backup (to supplement plan A, not to replace it) Only use your albuterol inhaler as a rescue medication to be used if you can't catch your breath by resting or doing a relaxed purse lip breathing pattern.  - The less you use it, the better it will work when you need it. - Ok to use the inhaler up to 2 puffs  every 4 hours if you must but call for appointment if use goes up over your usual need - Don't leave home without it !!  (think of it like the spare tire for your car)   Plan C = Crisis (instead of Plan B but only if Plan B stops working) - only use your albuterol nebulizer if you first try Plan B and it fails to help > ok to use the nebulizer up to every 4 hours but if start needing it regularly call for immediate appointment    Prednisone 10 mg take  4 each am x 2 days,   2 each am x 2 days,  1 each am x 2 days and stop   Please remember to go to the  x-ray department  for your tests - we will call you with the results when they are available    Please schedule a follow up office visit in 6 weeks, call sooner if needed with all medications /inhalers/ solutions in hand so we can verify exactly what you are taking. This includes all medications from all doctors and over the counters

## 2020-03-22 NOTE — Progress Notes (Signed)
Subjective:    Patient ID: [name] [name], female    DOB: 20-Sep-1950,     MRN: [id number]    Brief patient profile:  70  yo latino active smoker  female [employer] from [place] to 1997  With prev dx of copd aorund 2010 with doe on qid symbicort x 2013 but gradually worse then admit [facility] with PTX    10/31/2014 Preoperative Dx: Spontaneous left pneumothorax with persistent air leak Postoperative Dx: same Procedure: Left video-assisted thoracoscopy, stapling of apical bleb, mechanical pleurodesis   11/10/2014 1st [facility] office visit/ [name] [name]   Chief Complaint  Patient presents with  . PULMONARY CONSULT    Spontaneous Left Pneumothorax. Pt states that she has increased dyspnea with exertion. Pt c/o of occasional wet cough with yellow to clear mucus and chest pain with breathing. Pt denies any wheeze.   still way overusing saba at baseline mostly in neb form with doe x across the room and not using symbicort or pain meds correctly (see a/p) rec Plan A = Automatic = Symbiocrt 160 one twice daily and Incruse one click each am  Plan B = Backup - Only use your albuterol (Yellow/proventil) as a rescue medication Plan C = Crisis/ contingency - nebulizer, ok use with albuterol (or combination until you use it up)  every 4 hours if plan B is not working  For pain > tramadol 50 mg up to 2 every 4 hours  For nerves> alprazolam 0.5 mg up to one every 4 hours      11/24/2014  f/u ov/[name] re: copd/ pfts on symb/ incruse still using saba  Chief Complaint  Patient presents with  . Follow-up    pt following for COPD: pt states she is doing pretty well, she is still having pain on her left side, but it [place] much better.  pt states her breathing has been very well. pt c/o increase SOB when she exerts herself. no c/o chest tightness. pt needs refills on meds.    doe = MMRC1 = can walk nl pace, flat grade, can't hurry or go uphills or steps s sob  rec Please remember to go to the lab  department downstairs for your tests - we will call you with the results when they are available. Symbicort 160 Take1 puffs first thing in am and then another1 puffs about 12 hours later.  Goal is not to need to rescue albuterol  much at all      01/31/2015  f/u ov/[name] [name] re: COPD III/ symbicort 160 2bid/ incruse and prn saba  Chief Complaint  Patient presents with  . Follow-up    Pt states that breathing is unchanged since last visit. Pt states that she is under a lot of stress right now which has made her breathing worse   2x daily neb but not noct/ no hfa use  Pain L cw same as post op never improved, very sensitive chest wall / not resp to tramadol so far rec Please see patient coordinator before you leave today  to schedule referral to Internal Medicine/ Dr Drue Novel  Zostrix cream 4 x daily (over the counter) - as improves can reduce to bedtime only  Plan A = Automatic = Symbiocrt 160 one twice daily and Incruse one click each am  Plan B = Backup - Only use your albuterol (Yellow/proventil) as a rescue medication Plan C = Crisis/ contingency - nebulizer, ok use with albuterol (or combination until you use it up)  every 4  hours if plan B is not working    08/29/2016  f/u ov/[name] [name] re:   COPD GOLD III/ quit smoking  02/2016 Chief Complaint  Patient presents with  . Follow-up    l/s 01/2015- pt stopped smoking X6 mos ago, noted weight gain, since then c/o increased SOB, sinus congestion, PND, hoarseness.    walking 30 min s stopping sev times a week better if use albturol first despite using symbicort 160 2bid  Does not recall response to prednisone or incruse  Easily confused with details of care / access to meds issues  Doe = MMRC2 = can't walk a nl pace on a flat grade s sob but does fine slow and flat  rec Please see patient coordinator before you leave today  to schedule referral to Internal Medicine/ Dr Drue NovelPaz  Zostrix cream 4 x daily (over the counter) - as improves can reduce to  bedtime only  Plan A = Automatic = Symbiocrt 160 one twice daily and Incruse one click each am  Plan B = Backup - Only use your albuterol (Yellow/proventil) as a rescue medication Plan C = Crisis/ contingency - nebulizer, ok use with albuterol (or combination until you use it up)  every 4 hours if plan B is not working  Please schedule a follow up visit in 3 months but call sooner if needed - once establish with Dr Drue NovelPaz follow up here can be as needed  Late add: confused as to whether she uses the symb one bid vs 2 bid will leave things as they are right now due to communication barriers but ideally the dose should be 2 pffs every 12 hours unless can't tolerate for some reason     12/24/2018  f/u ov/[name] [name] re: re-establish re COPD GOLD III/ resumed smoking / problems with access to meds  Chief Complaint  Patient presents with  . Consult    Patient is here for COPD. Patient has started smoking again since the virus started and want's help to stop.   Dyspnea:  Has a 15 min course around house s stopping Cough: none  Sleeping: ok flat s respiratory symptoms  SABA use: avg twice daily  02: none  rec Plan A = Automatic = Symbicort 160 Take 2 puffs first thing in am and then another 2 puffs about 12 hours later.  And when available Incruse each am  (an option that has both is called Trelegy)  Plan B = Backup - Only use your albuterol (Yellow/proventil) as a rescue medication  Plan C = Crisis/ contingency - nebulizer, ok use with albuterol (or combination until you use it up)  every 4 hours if plan B is not working       06/27/2019  f/u ov/[name] [name] re:  GOLD III/ still smoking   breztri maint Chief Complaint  Patient presents with  . Follow-up    Breathing is overall doing well. She has occ cough with white sputum.  She is using her albuterol about 2 x per day.   Dyspnea:  Walking outside x 30 min  Cough: smoker's rattle worse in am's/ min mucoid Sleeping: flat bed one bed  SABA use: twice a  day  02: none  rec The key is to stop smoking completely before smoking completely stops you! For smoking cessation info  call [phone]  Try albuterol 15 min before an activity that you know would make you short of breath and see if it makes any difference and if makes none then don't  take it after activity unless you can't catch your breath. I strongly recommend you take the vaccine as soon as possible    Onset symptoms was 02/24/20  Admit date: 03/02/2020 Discharge date: 03/03/2020  Principal Problem:   Acute respiratory failure due to COVID-19 [facility])   COPD GOLD III   Morbid obesity due to excess calories (HCC)   Anxiety state   Cigarette smoker       History of Present Illness:   Kaylina Cahue Combsis a 70 y.o.femalewith medical history significant ofCOPD, diverticular disease, anxiety disorder, tobacco abuse presented to the hospital with shortness of breath for 7 days.  He was initially noted to be hypoxic in the ED and was admitted to hospital.  CBC and chemistry appear to be within normal LDH 168 triglyceride 58 ferritin 154 CRP 4.4 and lactic acid 0.7. Chest x-ray showed emphysema with minimal patchy airspace disease right greater than left. CT angiogram of the chest showed no PE or definite infiltrate. Severe emphysema. Patient was being admitted to the hospital with acute hypoxia secondary to COVID-19 infection.  Hospital Course:   Following conditions were addressed during hospitalization as listed below,  Acute respiratory failure due to COVID-19 infection: Patient was initially on supplemental oxygen which has been weaned off at this time.  Patient was started on IV remdesivir, dexamethasone,supplemental oxygen and inhalers. CTA chest without definite pneumonia. Mild had mildly elevated CRP mild leukopenia. Procalcitonin less than 0.10. hold baricitinib.  And was subsequently weaned off oxygen.  She did not wish to pursue further treatment in the  hospital but was clinically stable.  She was advised to seek medical attention for worsening symptoms.  She will be prescribed prednisone on discharge.  COPD:Gold 3 at baseline.   At this time.  Without supplemental oxygen.  Ongoing tobacco use disorder.   Was advised to quit smoking.    03/15/20 Televist p covid  For cough > mucinex dm up to 1200 mg every 12 hours as needed Doxycylcine 100 mg twice daily x 10 days Prednisone 10 mg take  4 each am x 2 days,   2 each am x 2 days,  1 each am x 2 days and stop  Only use nebulizer if can't catch your breath at rest>  Go to ER if still short of breath at rest after your nebulizer treatment   03/22/2020  f/u ov/Mariha Sleeper re: COPD GOLD III/ breztri one in am  Chief Complaint  Patient presents with  . Follow-up    Cough has improved since the last visit- still prod in the am with light green sputum. Her SOB has improved. She is using her albuterol neb daily.   Dyspnea:  Can only walk 10 ft at home (note 750 ft here)  Cough: improving on doxy/ much less  purulent now Sleeping: flat bed with small pillow  SABA use: neb neb one or twice daily  02: none  Covid status:   No vaccination   No obvious day to day or daytime variability or assoc  mucus plugs or hemoptysis or cp or chest tightness, subjective wheeze or overt sinus or hb symptoms.   Sleeping better now  without nocturnal  or early am exacerbation  of respiratory  c/o's or need for noct saba. Also denies any obvious fluctuation of symptoms with weather or environmental changes or other aggravating or alleviating factors except as outlined above   No unusual exposure hx or h/o childhood pna/ asthma or knowledge of premature birth.  Current  Allergies, Complete Past Medical History, Past Surgical History, Family History, and Social History were reviewed in [employer] record.  ROS  The following are not active complaints unless bolded Hoarseness, sore throat,  dysphagia, dental problems, itching, sneezing,  nasal congestion or discharge of excess mucus or purulent secretions, ear ache,   fever, chills, sweats, unintended wt loss or wt gain, classically pleuritic or exertional cp,  orthopnea pnd or arm/hand swelling  or leg swelling, presyncope, palpitations, abdominal pain, anorexia, nausea, vomiting, diarrhea  or change in bowel habits or change in bladder habits, change in stools or change in urine, dysuria, hematuria,  rash, arthralgias, visual complaints, headache, numbness, weakness or ataxia or problems with walking or coordination,  change in mood or  memory.        Current Meds  Medication Sig  . albuterol (PROAIR HFA) 108 (90 Base) MCG/ACT inhaler 2 puffs every 4 hours as needed only  if your can't catch your breath  . Budeson-Glycopyrrol-Formoterol (BREZTRI AEROSPHERE) 160-9-4.8 MCG/ACT AERO Inhale 2 puffs into the lungs in the morning and at bedtime.  . predniSONE (DELTASONE) 10 MG tablet Take  4 each am x 2 days,   2 each am x 2 days,  1 each am x 2 days and stop                  Objective:   Physical Exam      03/22/2020         176 06/27/2019        184   12/24/2018    180  11/24/2014      177 > 01/31/2015 180 >  08/29/2016    190     11/10/14 177 lb 3.2 oz (80.377 kg)  11/08/14 178 lb 5 oz (80.882 kg)  10/26/14 177 lb 1.6 oz (80.332 kg)     Vital signs reviewed  03/22/2020  - Note at rest 02 sats 94% on 4   General appearance:    Chronically ill somber hispanic female nad   HEENT : pt wearing mask not removed for exam due to covid - 19 concerns.    NECK :  without JVD/Nodes/TM/ nl carotid upstrokes bilaterally   LUNGS: no acc muscle use,  Mild barrel  contour chest wall with bilateral  Distant bs s audible wheeze and  without cough on insp or exp maneuvers  and mild  Hyperresonant  to  percussion bilaterally     CV:  RRR  no s3 or murmur or increase in P2, and no edema   ABD:  soft and nontender with pos end  insp  Hoover's  in the supine position. No bruits or organomegaly appreciated, bowel sounds nl  MS:   Nl gait/  ext warm without deformities, calf tenderness, cyanosis or clubbing No obvious joint restrictions   SKIN: warm and dry without lesions    NEURO:  alert, approp, nl sensorium with  no motor or cerebellar deficits apparent.        03/22/2020 cxr rec/ not done    Assessment & Plan:

## 2020-03-23 ENCOUNTER — Ambulatory Visit (INDEPENDENT_AMBULATORY_CARE_PROVIDER_SITE_OTHER): Payer: 59

## 2020-03-23 ENCOUNTER — Encounter: Payer: Self-pay | Admitting: Internal Medicine

## 2020-03-23 DIAGNOSIS — J449 Chronic obstructive pulmonary disease, unspecified: Secondary | ICD-10-CM | POA: Diagnosis not present

## 2020-03-23 NOTE — Assessment & Plan Note (Signed)
Active smoker  - 11/10/2014  new maint rx = symb 160/incruse sample - Alpha one AT 11/24/2014 >  MM/ nl levels - 01/31/2015  extensive coaching HFA effectiveness =    90%  - spirometry 01/31/2015  FEV1  0.85 (37%) ratio 34  - Spirometry 08/29/2016  FEV1 0.71 (30%)  Ratio 36  > added back LAMA = incruse samples  - 03/22/2020  After extensive coaching inhaler device,  effectiveness =    75% from a baseline on 25 % > continue breztri   Pt is Group B in terms of symptom/risk and laba/lama therefore appropriate rx at this point >>>  Continue breztri and approp saba/ prednisone x 6 days if get worse  Re saba: I spent extra time with pt today reviewing appropriate use of albuterol for prn use on exertion with the following points: 1) saba is for relief of sob that does not improve by walking a slower pace or resting but rather if the pt does not improve after trying this first. 2) If the pt is convinced, as many are, that saba helps recover from activity faster then it's easy to tell if this is the case by re-challenging : ie stop, take the inhaler, then p 5 minutes try the exact same activity (intensity of workload) that just caused the symptoms and see if they are substantially diminished or not after saba 3) if there is an activity that reproducibly causes the symptoms, try the saba 15 min before the activity on alternate days   If in fact the saba really does help, then fine to continue to use it prn but advised may need to look closer at the maintenance regimen being used to achieve better control of airways disease with exertion.

## 2020-03-23 NOTE — Assessment & Plan Note (Addendum)
Counseled re importance of smoking cessation but did not meet time criteria for separate billing   °

## 2020-03-23 NOTE — Assessment & Plan Note (Addendum)
Onset of symptoms of covid = 02/24/20 > see admit 03/02/20 - 03/22/2020   Walked RA  3 laps @ approx 246ft each @ avg pace stopped due to end of study, sats 88% at very end   No indication for 02 based on level of activity she's attempting at this point.  Advised: Make sure you check your oxygen saturation at your highest level of activity to be sure it stays over 90% and keep track of it at least once a week, more often if breathing getting worse, and let me know if losing ground.   Medical decision making was a moderate level of complexity in this case because of  two chronic conditions /diagnoses requiring extra time for  H and P, chart review, counseling, teaching hfa and generating customized AVS unique to this office visit and charting.   Each maintenance medication was reviewed in detail including emphasizing most importantly the difference between maintenance and prns and under what circumstances the prns are to be triggered using an action plan format where appropriate. Please see avs for details which were reviewed in writing by both me and my nurse and patient given a written copy highlighted where appropriate with yellow highlighter for the patient's continued care at home along with an updated version of their medications.  Patient was asked to maintain medication reconciliation by comparing this list to the actual medications being used at home and to contact this office right away if there is a conflict or discrepancy.

## 2020-03-27 ENCOUNTER — Encounter: Payer: Self-pay | Admitting: *Deleted

## 2020-05-04 ENCOUNTER — Ambulatory Visit: Payer: 59 | Admitting: Internal Medicine

## 2020-06-12 ENCOUNTER — Ambulatory Visit (INDEPENDENT_AMBULATORY_CARE_PROVIDER_SITE_OTHER): Payer: 59 | Admitting: Internal Medicine

## 2020-06-12 ENCOUNTER — Other Ambulatory Visit: Payer: Self-pay

## 2020-06-12 ENCOUNTER — Encounter: Payer: Self-pay | Admitting: Internal Medicine

## 2020-06-12 DIAGNOSIS — J449 Chronic obstructive pulmonary disease, unspecified: Secondary | ICD-10-CM

## 2020-06-12 DIAGNOSIS — F17210 Nicotine dependence, cigarettes, uncomplicated: Secondary | ICD-10-CM | POA: Diagnosis not present

## 2020-06-12 NOTE — Patient Instructions (Signed)
If you are satisfied with your treatment plan,  let your doctor know and he/she can either refill your medications or you can return here when your prescription runs out.     If in any way you are not 100% satisfied,  please tell us.  If 100% better, tell your friends!  Pulmonary follow up is as needed   

## 2020-06-12 NOTE — Assessment & Plan Note (Signed)
Active smoker  - 11/10/2014  new maint rx = symb 160/incruse sample - Alpha one AT 11/24/2014 >  MM/ nl levels - 01/31/2015  extensive coaching HFA effectiveness =    90%  - spirometry 01/31/2015  FEV1  0.85 (37%) ratio 34  - Spirometry 08/29/2016  FEV1 0.71 (30%)  Ratio 36  > added back LAMA = incruse samples  - 06/12/2020  After extensive coaching inhaler device,  effectiveness =    90% > continue breztri    Group D in terms of symptom/risk and laba/lama/ICS  therefore appropriate rx at this point >>>  Continue breztri and prn saba   F/u can be per PCP if continues to do well and just needs refills

## 2020-06-12 NOTE — Assessment & Plan Note (Signed)
Counseled re importance of smoking cessation but did not meet time criteria for separate billing           Each maintenance medication was reviewed in detail including emphasizing most importantly the difference between maintenance and prns and under what circumstances the prns are to be triggered using an action plan format where appropriate.  Total time for H and P, chart review, counseling, reviewing hfa device(s) and generating customized AVS unique to this summary final  office visit / same day charting = 20 min

## 2020-06-12 NOTE — Progress Notes (Signed)
Subjective:    Patient ID: [name] [name], female    DOB: 06-23-1950,     MRN: [id number]    Brief patient profile:  70  yo [place] MM/active smoker  female [employer] from [place] to 1997  With prev dx of copd around 2010 with doe on qid symbicort x 2013 but gradually worse then admit to [facility] with PTX    10/31/2014 Preoperative Dx: Spontaneous left pneumothorax with persistent air leak Postoperative Dx: same Procedure: Left video-assisted thoracoscopy, stapling of apical bleb, mechanical pleurodesis   11/10/2014 1st [facility] office visit/ [name] [name]   Chief Complaint  Patient presents with  . PULMONARY CONSULT    Spontaneous Left Pneumothorax. Pt states that she has increased dyspnea with exertion. Pt c/o of occasional wet cough with yellow to clear mucus and chest pain with breathing. Pt denies any wheeze.   still way overusing saba at baseline mostly in neb form with doe x across the room and not using symbicort or pain meds correctly (see a/p) rec Plan A = Automatic = Symbiocrt 160 one twice daily and Incruse one click each am  Plan B = Backup - Only use your albuterol (Yellow/proventil) as a rescue medication Plan C = Crisis/ contingency - nebulizer, ok use with albuterol (or combination until you use it up)  every 4 hours if plan B is not working  For pain > tramadol 50 mg up to 2 every 4 hours  For nerves> alprazolam 0.5 mg up to one every 4 hours      11/24/2014  f/u ov/[name] [name] re: copd/ pfts on symb/ incruse still using saba  Chief Complaint  Patient presents with  . Follow-up    pt following for COPD: pt states she is doing pretty well, she is still having pain on her left side, but it [place] much better.  pt states her breathing has been very well. pt c/o increase SOB when she exerts herself. no c/o chest tightness. pt needs refills on meds.    doe = MMRC1 = can walk nl pace, flat grade, can't hurry or go uphills or steps s sob  rec Please remember to go to the  lab department downstairs for your tests - we will call you with the results when they are available. Symbicort 160 Take1 puffs first thing in am and then another1 puffs about 12 hours later.  Goal is not to need to rescue albuterol  much at all      01/31/2015  f/u ov/[name] [name] re: COPD III/ symbicort 160 2bid/ incruse and prn saba  Chief Complaint  Patient presents with  . Follow-up    Pt states that breathing is unchanged since last visit. Pt states that she is under a lot of stress right now which has made her breathing worse   2x daily neb but not noct/ no hfa use  Pain L cw same as post op never improved, very sensitive chest wall / not resp to tramadol so far rec Please see patient coordinator before you leave today  to schedule referral to Internal Medicine/ Dr Drue Novel  Zostrix cream 4 x daily (over the counter) - as improves can reduce to bedtime only  Plan A = Automatic = Symbiocrt 160 one twice daily and Incruse one click each am  Plan B = Backup - Only use your albuterol (Yellow/proventil) as a rescue medication Plan C = Crisis/ contingency - nebulizer, ok use with albuterol (or combination until you use it up)  every 4  hours if plan B is not working    08/29/2016  f/u ov/[name] [name] re:   COPD GOLD III/ quit smoking  02/2016 Chief Complaint  Patient presents with  . Follow-up    l/s 01/2015- pt stopped smoking X6 mos ago, noted weight gain, since then c/o increased SOB, sinus congestion, PND, hoarseness.    walking 30 min s stopping sev times a week better if use albturol first despite using symbicort 160 2bid  Does not recall response to prednisone or incruse  Easily confused with details of care / access to meds issues  Doe = MMRC2 = can't walk a nl pace on a flat grade s sob but does fine slow and flat  rec Please see patient coordinator before you leave today  to schedule referral to Internal Medicine/ Dr Drue NovelPaz  Zostrix cream 4 x daily (over the counter) - as improves can reduce to  bedtime only  Plan A = Automatic = Symbiocrt 160 one twice daily and Incruse one click each am  Plan B = Backup - Only use your albuterol (Yellow/proventil) as a rescue medication Plan C = Crisis/ contingency - nebulizer, ok use with albuterol (or combination until you use it up)  every 4 hours if plan B is not working  Please schedule a follow up visit in 3 months but call sooner if needed - once establish with Dr Drue NovelPaz follow up here can be as needed  Late add: confused as to whether she uses the symb one bid vs 2 bid will leave things as they are right now due to communication barriers but ideally the dose should be 2 pffs every 12 hours unless can't tolerate for some reason     12/24/2018  f/u ov/[name] [name] re: re-establish re COPD GOLD III/ resumed smoking / problems with access to meds  Chief Complaint  Patient presents with  . Consult    Patient is here for COPD. Patient has started smoking again since the virus started and want's help to stop.   Dyspnea:  Has a 15 min course around house s stopping Cough: none  Sleeping: ok flat s respiratory symptoms  SABA use: avg twice daily  02: none  rec Plan A = Automatic = Symbicort 160 Take 2 puffs first thing in am and then another 2 puffs about 12 hours later.  And when available Incruse each am  (an option that has both is called Trelegy)  Plan B = Backup - Only use your albuterol (Yellow/proventil) as a rescue medication  Plan C = Crisis/ contingency - nebulizer, ok use with albuterol (or combination until you use it up)  every 4 hours if plan B is not working       06/27/2019  f/u ov/[name] [name] re:  GOLD III/ still smoking   breztri maint Chief Complaint  Patient presents with  . Follow-up    Breathing is overall doing well. She has occ cough with white sputum.  She is using her albuterol about 2 x per day.   Dyspnea:  Walking outside x 30 min  Cough: smoker's rattle worse in am's/ min mucoid Sleeping: flat bed one bed  SABA use: twice a  day  02: none  rec The key is to stop smoking completely before smoking completely stops you! For smoking cessation info  call [phone]  Try albuterol 15 min before an activity that you know would make you short of breath and see if it makes any difference and if makes none then don't  take it after activity unless you can't catch your breath. I strongly recommend you take the vaccine as soon as possible    Onset symptoms was 02/24/20  Admit date: 03/02/2020 Discharge date: 03/03/2020  Principal Problem:   Acute respiratory failure due to COVID-19 [facility])   COPD GOLD III   Morbid obesity due to excess calories (HCC)   Anxiety state   Cigarette smoker       History of Present Illness:   [name] [name] a 70 y.o.femalewith medical history significant ofCOPD, diverticular disease, anxiety disorder, tobacco abuse presented to the hospital with shortness of breath for 7 days.  He was initially noted to be hypoxic in the ED and was admitted to hospital.  CBC and chemistry appear to be within normal LDH 168 triglyceride 58 ferritin 154 CRP 4.4 and lactic acid 0.7. Chest x-ray showed emphysema with minimal patchy airspace disease right greater than left. CT angiogram of the chest showed no PE or definite infiltrate. Severe emphysema. Patient was being admitted to the hospital with acute hypoxia secondary to COVID-19 infection.  Hospital Course:   Following conditions were addressed during hospitalization as listed below,  Acute respiratory failure due to COVID-19 infection: Patient was initially on supplemental oxygen which has been weaned off at this time.  Patient was started on IV remdesivir, dexamethasone,supplemental oxygen and inhalers. CTA chest without definite pneumonia. Mild had mildly elevated CRP mild leukopenia. Procalcitonin less than 0.10. hold baricitinib.  And was subsequently weaned off oxygen.  She did not wish to pursue further treatment in the  hospital but was clinically stable.  She was advised to seek medical attention for worsening symptoms.  She will be prescribed prednisone on discharge.  COPD:Gold 3 at baseline.   At this time.  Without supplemental oxygen.  Ongoing tobacco use disorder.   Was advised to quit smoking.    03/15/20 Televist p covid  For cough > mucinex dm up to 1200 mg every 12 hours as needed Doxycylcine 100 mg twice daily x 10 days Prednisone 10 mg take  4 each am x 2 days,   2 each am x 2 days,  1 each am x 2 days and stop  Only use nebulizer if can't catch your breath at rest>  Go to ER if still short of breath at rest after your nebulizer treatment   03/22/2020  f/u ov/[name] [name] re: COPD GOLD III/ breztri one in am  Chief Complaint  Patient presents with  . Follow-up    Cough has improved since the last visit- still prod in the am with light green sputum. Her SOB has improved. She is using her albuterol neb daily.   Dyspnea:  Can only walk 10 ft at home (note 750 ft here)  Cough: improving on doxy/ much less  purulent now Sleeping: flat bed with small pillow  SABA use: neb neb one or twice daily  02: none  Covid status:   No vaccination rec The key is to stop smoking completely before smoking completely stops you! Plan A = Automatic = Always=    Breztri Take 2 puffs first thing in am and then another 2 puffs about 12 hours later.  Work on inhaler technique: Plan B = Backup (to supplement plan A, not to replace it) Only use your albuterol inhaler as a rescue medication Plan C = Crisis (instead of Plan B but only if Plan B stops working) - only use your albuterol nebulizer if you first try Plan B  Prednisone 10 mg take  4 each am x 2 days,   2 each am x 2 days,  1 each am x 2 days and stop  Please schedule a follow up office visit in 6 weeks, call sooner if needed with all medications /inhalers/ solutions in hand so we can verify exactly what you are taking. This includes all medications from all  doctors and over the counters       06/12/2020  f/u ov/[name] [name] re: copd gold 3 /breztri still smoking  Chief Complaint  Patient presents with  . Follow-up    Covid in dec. Still feeling tired, sob with exertion. Breztri working well.  Feels numbness in hands and feet   Dyspnea: improved on breztri (walked 750 ft last ov)  Cough: none  Sleeping: bed is flat with 1 pillow  SABA use: none  02: none  Covid status:   No  vax / refuses to consider   No obvious day to day or daytime variability or assoc excess/ purulent sputum or mucus plugs or hemoptysis or cp or chest tightness, subjective wheeze or overt sinus or hb symptoms.   Sleeping as above without nocturnal  or early am exacerbation  of respiratory  c/o's or need for noct saba. Also denies any obvious fluctuation of symptoms with weather or environmental changes or other aggravating or alleviating factors except as outlined above   No unusual exposure hx or h/o childhood pna/ asthma or knowledge of premature birth.  Current Allergies, Complete Past Medical History, Past Surgical History, Family History, and Social History were reviewed in [employer] record.  ROS  The following are not active complaints unless bolded Hoarseness, sore throat, dysphagia, dental problems, itching, sneezing,  nasal congestion or discharge of excess mucus or purulent secretions, ear ache,   fever, chills, sweats, unintended wt loss or wt gain, classically pleuritic or exertional cp,  orthopnea pnd or arm/hand swelling  or leg swelling, presyncope, palpitations, abdominal pain, anorexia, nausea, vomiting, diarrhea  or change in bowel habits or change in bladder habits, change in stools or change in urine, dysuria, hematuria,  rash, arthralgias, visual complaints, headache, numbness, weakness or ataxia or problems with walking or coordination,  change in mood or  memory.        Current Meds  Medication Sig  . acetaminophen (TYLENOL) 500  MG tablet Take 1 tablet (500 mg total) by mouth every 6 (six) hours as needed.  Marland Kitchen albuterol (PROAIR HFA) 108 (90 Base) MCG/ACT inhaler 2 puffs every 4 hours as needed only  if your can't catch your breath  . albuterol (PROVENTIL) (2.5 MG/3ML) 0.083% nebulizer solution USE 1 VIAL BY NEBULIZATION EVERY 4 HOURS AS NEEDED FOR WHEEZING OR SHORTNESS OF BREATH.  Marland Kitchen aspirin 325 MG tablet Take 325 mg by mouth every 4 (four) hours as needed for mild pain, moderate pain, headache or fever.  . Budeson-Glycopyrrol-Formoterol (BREZTRI AEROSPHERE) 160-9-4.8 MCG/ACT AERO Inhale 2 puffs into the lungs 2 (two) times daily.  Marland Kitchen buPROPion (WELLBUTRIN SR) 150 MG 12 hr tablet Take 1 tablet (150 mg total) by mouth 2 (two) times daily. (Patient taking differently: Take 150 mg by mouth 2 (two) times daily. For wanting to stop smoking)  . cetirizine (ZYRTEC) 10 MG tablet Take 1 tablet (10 mg total) by mouth daily. (Patient taking differently: Take 10 mg by mouth daily as needed for allergies.)  . hydrOXYzine (ATARAX/VISTARIL) 10 MG tablet Take 1 tablet (10 mg total) by mouth every 4 (four) hours  as needed.  Marland Kitchen ibuprofen (ADVIL,MOTRIN) 200 MG tablet Take 400 mg by mouth every 6 (six) hours as needed for headache or moderate pain.  . [DISCONTINUED] Budeson-Glycopyrrol-Formoterol (BREZTRI AEROSPHERE) 160-9-4.8 MCG/ACT AERO Inhale 2 puffs into the lungs in the morning and at bedtime.             Objective:   Physical Exam     06/12/2020       179 03/22/2020        176 06/27/2019        184   12/24/2018    180  11/24/2014      177 > 01/31/2015 180 >  08/29/2016    190     11/10/14 177 lb 3.2 oz (80.377 kg)  11/08/14 178 lb 5 oz (80.882 kg)  10/26/14 177 lb 1.6 oz (80.332 kg)    Vital signs reviewed  06/12/2020  - Note at rest 02 sats  97% on RA   General appearance:    Ambulatory hispanic female with rapid speech and tangential response pattern, focues entirely on recent events on [place]     HEENT : pt wearing  mask not removed for exam due to covid - 19 concerns.    NECK :  without JVD/Nodes/TM/ nl carotid upstrokes bilaterally   LUNGS: no acc muscle use,  Mild barrel  contour chest wall with bilateral  Distant bs s audible wheeze and  without cough on insp or exp maneuvers  and mild  Hyperresonant  to  percussion bilaterally     CV:  RRR  no s3 or murmur or increase in P2, and no edema   ABD:  soft and nontender with pos end  insp Hoover's  in the supine position. No bruits or organomegaly appreciated, bowel sounds nl  MS:   Nl gait/  ext warm without deformities, calf tenderness, cyanosis or clubbing No obvious joint restrictions   SKIN: warm and dry without lesions    NEURO:  alert, approp, nl sensorium with  no motor or cerebellar deficits apparent.         Assessment & Plan:

## 2020-07-11 ENCOUNTER — Telehealth: Payer: Self-pay | Admitting: Internal Medicine

## 2020-07-11 MED ORDER — BREZTRI AEROSPHERE 160-9-4.8 MCG/ACT IN AERO: 2.0000 | INHALATION_SPRAY | Freq: Two times a day (BID) | RESPIRATORY_TRACT | 3 refills | Status: DC

## 2020-07-11 NOTE — Telephone Encounter (Signed)
Rx printed and placed in Dr Thurston Hole lookat in A pod to be signed

## 2020-07-13 NOTE — Telephone Encounter (Signed)
done

## 2020-07-13 NOTE — Telephone Encounter (Signed)
Rx was faxed to [facility] and me  I left detailed msg on machine lettering pt know this was done ok per DPR Will close encounter

## 2021-01-14 ENCOUNTER — Encounter (INDEPENDENT_AMBULATORY_CARE_PROVIDER_SITE_OTHER): Payer: Medicare Other | Admitting: Ophthalmology

## 2021-02-02 ENCOUNTER — Other Ambulatory Visit: Payer: Self-pay

## 2021-02-02 ENCOUNTER — Emergency Department (HOSPITAL_BASED_OUTPATIENT_CLINIC_OR_DEPARTMENT_OTHER)
Admission: EM | Admit: 2021-02-02 | Discharge: 2021-02-02 | Disposition: A | Discharge status: Home / Self Care | Payer: 59 | Attending: Emergency Medicine | Admitting: Emergency Medicine

## 2021-02-02 ENCOUNTER — Emergency Department (HOSPITAL_BASED_OUTPATIENT_CLINIC_OR_DEPARTMENT_OTHER): Payer: 59 | Admitting: Radiology

## 2021-02-02 ENCOUNTER — Encounter (HOSPITAL_BASED_OUTPATIENT_CLINIC_OR_DEPARTMENT_OTHER): Payer: Self-pay | Admitting: *Deleted

## 2021-02-02 DIAGNOSIS — Z7982 Long term (current) use of aspirin: Secondary | ICD-10-CM | POA: Insufficient documentation

## 2021-02-02 DIAGNOSIS — Z20822 Contact with and (suspected) exposure to covid-19: Secondary | ICD-10-CM | POA: Insufficient documentation

## 2021-02-02 DIAGNOSIS — Z8616 Personal history of COVID-19: Secondary | ICD-10-CM | POA: Insufficient documentation

## 2021-02-02 DIAGNOSIS — J449 Chronic obstructive pulmonary disease, unspecified: Secondary | ICD-10-CM | POA: Insufficient documentation

## 2021-02-02 DIAGNOSIS — Z7951 Long term (current) use of inhaled steroids: Secondary | ICD-10-CM | POA: Insufficient documentation

## 2021-02-02 DIAGNOSIS — R0602 Shortness of breath: Secondary | ICD-10-CM | POA: Diagnosis present

## 2021-02-02 DIAGNOSIS — F17210 Nicotine dependence, cigarettes, uncomplicated: Secondary | ICD-10-CM | POA: Diagnosis not present

## 2021-02-02 LAB — CBC WITH DIFFERENTIAL/PLATELET
Abs Immature Granulocytes: 0.02 10*3/uL (ref 0.00–0.07)
Basophils Absolute: 0 10*3/uL (ref 0.0–0.1)
Basophils Relative: 0 %
Eosinophils Absolute: 0.1 10*3/uL (ref 0.0–0.5)
Eosinophils Relative: 1 %
HCT: 41.4 % (ref 36.0–46.0)
Hemoglobin: 13.8 g/dL (ref 12.0–15.0)
Immature Granulocytes: 0 %
Lymphocytes Relative: 25 %
Lymphs Abs: 1.8 10*3/uL (ref 0.7–4.0)
MCH: 27.8 pg (ref 26.0–34.0)
MCHC: 33.3 g/dL (ref 30.0–36.0)
MCV: 83.3 fL (ref 80.0–100.0)
Monocytes Absolute: 0.4 10*3/uL (ref 0.1–1.0)
Monocytes Relative: 5 %
Neutro Abs: 4.9 10*3/uL (ref 1.7–7.7)
Neutrophils Relative %: 69 %
Platelets: 295 10*3/uL (ref 150–400)
RBC: 4.97 MIL/uL (ref 3.87–5.11)
RDW: 13 % (ref 11.5–15.5)
WBC: 7.4 10*3/uL (ref 4.0–10.5)
nRBC: 0 % (ref 0.0–0.2)

## 2021-02-02 LAB — BASIC METABOLIC PANEL
Anion gap: 8 (ref 5–15)
BUN: 16 mg/dL (ref 8–23)
CO2: 28 mmol/L (ref 22–32)
Calcium: 9.1 mg/dL (ref 8.9–10.3)
Chloride: 103 mmol/L (ref 98–111)
Creatinine, Ser: 0.58 mg/dL (ref 0.44–1.00)
GFR, Estimated: >60 mL/min (ref >60)
Glucose, Bld: 93 mg/dL (ref 70–99)
Potassium: 3.9 mmol/L (ref 3.5–5.1)
Sodium: 139 mmol/L (ref 135–145)

## 2021-02-02 LAB — RESP PANEL BY RT-PCR (FLU A&B, COVID) ARPGX2
Influenza A by PCR: NEGATIVE
Influenza B by PCR: NEGATIVE
SARS Coronavirus 2 by RT PCR: NEGATIVE

## 2021-02-02 MED ORDER — AEROCHAMBER PLUS FLO-VU MISC
1.0000 | Freq: Once | Status: AC
  Administered 2021-02-02: 1
  Filled 2021-02-02: qty 1

## 2021-02-02 MED ORDER — AZITHROMYCIN 250 MG PO TABS: 250.0000 mg | ORAL_TABLET | Freq: Every day | ORAL | 0 refills | Status: DC

## 2021-02-02 MED ORDER — PREDNISONE 10 MG PO TABS: 40.0000 mg | ORAL_TABLET | Freq: Every day | ORAL | 0 refills | Status: DC

## 2021-02-02 MED ORDER — METHYLPREDNISOLONE SODIUM SUCC 125 MG IJ SOLR
125.0000 mg | Freq: Once | INTRAMUSCULAR | Status: AC
  Administered 2021-02-02: 125 mg via INTRAVENOUS
  Filled 2021-02-02: qty 2

## 2021-02-02 MED ORDER — IPRATROPIUM-ALBUTEROL 0.5-2.5 (3) MG/3ML IN SOLN
3.0000 mL | Freq: Two times a day (BID) | RESPIRATORY_TRACT | Status: DC
  Administered 2021-02-02: 3 mL via RESPIRATORY_TRACT
  Filled 2021-02-02: qty 3

## 2021-02-02 NOTE — ED Notes (Signed)
RT educated pt on proper use of MDI w/aerochamber. Pt able to perform w/out difficulty. Pt verbalizes understanding at this time.

## 2021-02-02 NOTE — ED Triage Notes (Signed)
Sick of common cold 7 days ago and has hx of COPD.  COPD exacerbation and used her inhaler today with no relief.

## 2021-02-02 NOTE — Discharge Instructions (Addendum)
Please take steroids and antibiotics as prescribed.  Please return to the emergency department if you experience worsening shortness of breath, worsening cough, fevers, chills, or other concerns you may have.

## 2021-02-02 NOTE — ED Provider Notes (Signed)
[facility] EMERGENCY DEPT Provider Note   CSN: [id number] Arrival date & time: 02/02/21  1619     History Chief Complaint  Patient presents with   Shortness of Breath   Cough    [name] [name] is a 70 y.o. female with hx of COPD who presents to the ED with cough and shortness of breath that has been constant and worsening of the last 4 days.  Patient states she has been sick with subjective fever, chills, and upper respiratory symptoms earlier in the week.  Patient has been doing her home nebulizers without any improvement.  It got severe today and patient states she got scared which prompted her arrival to the emergency department today.  Her shortness of breath is worse with any exertion.  She denies any nausea, vomiting, diarrhea, abdominal pain.  Patient does have a productive cough with green sputum.   Shortness of Breath Associated symptoms: cough   Cough Associated symptoms: shortness of breath       Past Medical History:  Diagnosis Date   Anxiety    Complication of anesthesia    COPD (chronic obstructive pulmonary disease) (HCC)    Diverticulitis    Dyspnea    Migraines     Patient Active Problem List   Diagnosis Date Noted   Acute respiratory failure due to COVID-19 (HCC) 03/02/2020   CVA (cerebral vascular accident) (HCC) 12/17/2015   Left shoulder pain 11/22/2015   PCP NOTE>>>>>>>>>>>>>>>>>>>>>>>>>>>> 05/03/2015   Anxiety state 03/12/2015   Cigarette smoker 03/12/2015   COPD GOLD III 11/11/2014   Morbid obesity due to excess calories (HCC) 11/11/2014   Pneumothorax, spontaneous, tension 10/26/2014    Past Surgical History:  Procedure Laterality Date   CESAREAN SECTION     STAPLING OF BLEBS Left 10/31/2014   Procedure: STAPLING OF BLEBS;  Surgeon: Alleen Borne, MD;  Location: [facility] OR;  Service: Thoracic;  Laterality: Left;   VIDEO ASSISTED THORACOSCOPY Left 10/31/2014   Procedure: LEFT VIDEO ASSISTED THORACOSCOPY;  Surgeon: Alleen Borne,  MD;  Location: [facility] OR;  Service: Thoracic;  Laterality: Left;     OB History   No obstetric history on file.     Family History  Problem Relation Age of Onset   Lung cancer Father    Lung cancer Brother        type?   Colon cancer Neg Hx    Breast cancer Neg Hx    CAD Neg Hx    Diabetes Neg Hx     Social History   Tobacco Use   Smoking status: Every Day    Packs/day: 1.00    Types: Cigarettes    Start date: 04/23/2018   Smokeless tobacco: Never   Tobacco comments:    started age in her late teens; +/- 1 ppd  Vaping Use   Vaping Use: Never used  Substance Use Topics   Alcohol use: No    Alcohol/week: 0.0 standard drinks   Drug use: No    Home Medications Prior to Admission medications   Medication Sig Start Date End Date Taking? Authorizing Provider  azithromycin (ZITHROMAX) 250 MG tablet Take 1 tablet (250 mg total) by mouth daily. Take first 2 tablets together, then 1 every day until finished. 02/02/21  Yes Meredeth Ide, Raymie Giammarco M, PA-C  predniSONE (DELTASONE) 10 MG tablet Take 4 tablets (40 mg total) by mouth daily. 02/02/21  Yes Meredeth Ide, Lamoyne Hessel M, PA-C  acetaminophen (TYLENOL) 500 MG tablet Take 1 tablet (500 mg total) by  mouth every 6 (six) hours as needed. 03/18/16   Loletta Specter, PA-C  albuterol [facility]) [phone] Base) MCG/ACT inhaler 2 puffs every 4 hours as needed only  if your can't catch your breath 03/22/20   Nyoka Cowden, MD  albuterol (PROVENTIL) (2.5 MG/3ML) 0.083% nebulizer solution USE 1 VIAL BY NEBULIZATION EVERY 4 HOURS AS NEEDED FOR WHEEZING OR SHORTNESS OF BREATH. 08/02/19   Hoy Register, MD  aspirin 325 MG tablet Take 325 mg by mouth every 4 (four) hours as needed for mild pain, moderate pain, headache or fever.    [provider]  atorvastatin (LIPITOR) 20 MG tablet TAKE 1 TABLET (20 MG TOTAL) BY MOUTH DAILY AT 6 PM. Patient not taking: Reported on 06/12/2020 04/05/19   Hoy Register, MD  Budeson-Glycopyrrol-Formoterol (BREZTRI  AEROSPHERE) 160-9-4.8 MCG/ACT AERO Inhale 2 puffs into the lungs 2 (two) times daily. 03/30/19   Nyoka Cowden, MD  Budeson-Glycopyrrol-Formoterol (BREZTRI AEROSPHERE) 160-9-4.8 MCG/ACT AERO Inhale 2 puffs into the lungs in the morning and at bedtime. 07/11/20   Nyoka Cowden, MD  buPROPion (WELLBUTRIN SR) 150 MG 12 hr tablet Take 1 tablet (150 mg total) by mouth 2 (two) times daily. Patient taking differently: Take 150 mg by mouth 2 (two) times daily. For wanting to stop smoking 02/28/19   Claiborne Rigg, NP  cetirizine (ZYRTEC) 10 MG tablet Take 1 tablet (10 mg total) by mouth daily. Patient taking differently: Take 10 mg by mouth daily as needed for allergies. 04/27/19   Hoy Register, MD  diclofenac sodium (VOLTAREN) 1 % GEL Apply 2 g topically 4 (four) times daily. Patient not taking: Reported on 06/12/2020 01/01/16   Pete Glatter, MD  doxycycline (VIBRA-TABS) 100 MG tablet Take 1 tablet (100 mg total) by mouth 2 (two) times daily. Patient not taking: Reported on 06/12/2020 03/15/20   Nyoka Cowden, MD  fluticasone [facility]) 50 MCG/ACT nasal spray Place 2 sprays into both nostrils daily. Patient not taking: Reported on 06/12/2020 04/27/19   Hoy Register, MD  guaiFENesin-dextromethorphan (ROBITUSSIN DM) 100-10 MG/5ML syrup Take 10 mLs by mouth every 4 (four) hours as needed for cough. Patient not taking: Reported on 06/12/2020 03/03/20   Joycelyn Das, MD  hydrOXYzine (ATARAX/VISTARIL) 10 MG tablet Take 1 tablet (10 mg total) by mouth every 4 (four) hours as needed. 12/24/18   Nyoka Cowden, MD  ibuprofen (ADVIL,MOTRIN) 200 MG tablet Take 400 mg by mouth every 6 (six) hours as needed for headache or moderate pain.    [provider]  omeprazole (PRILOSEC OTC) 20 MG tablet Take 1 tablet (20 mg total) by mouth daily for 10 days. 03/03/20 03/13/20  Pokhrel, Rebekah Chesterfield, MD  predniSONE (DELTASONE) 10 MG tablet Take  4 each am x 2 days,   2 each am x 2 days,  1 each am x 2 days and  stop Patient not taking: Reported on 06/12/2020 03/22/20   Nyoka Cowden, MD    Allergies    Patient has no known allergies.  Review of Systems   Review of Systems  Respiratory:  Positive for cough and shortness of breath.   All other systems reviewed and are negative.  Physical Exam Updated Vital Signs BP (!) 154/87    Pulse 80    Temp 98 F (36.7 C)    Resp 17    Ht 5\' 6"  (1.676 m)    Wt 77.1 kg    SpO2 97%    BMI 27.44 kg/m  Physical Exam Vitals and nursing note reviewed.  Constitutional:      General: She is not in acute distress.    Appearance: Normal appearance.  HENT:     Head: Normocephalic and atraumatic.  Eyes:     General:        Right eye: No discharge.        Left eye: No discharge.  Cardiovascular:     Comments: Regular rate and rhythm.  S1/S2 are distinct without any evidence of murmur, rubs, or gallops.  Radial pulses are 2+ bilaterally.  Dorsalis pedis pulses are 2+ bilaterally.  No evidence of pedal edema. Pulmonary:     Effort: Pulmonary effort is normal.     Comments: Scant wheezing diffusely and mild rhonchi. normal effort.  No respiratory distress. Abdominal:     General: Abdomen is flat. Bowel sounds are normal. There is no distension.     Tenderness: There is no abdominal tenderness. There is no guarding or rebound.  Musculoskeletal:        General: Normal range of motion.     Cervical back: Neck supple.  Skin:    General: Skin is warm and dry.     Findings: No rash.  Neurological:     General: No focal deficit present.     Mental Status: She is alert.  Psychiatric:        Mood and Affect: Mood normal.        Behavior: Behavior normal.    ED Results / Procedures / Treatments   Labs (all labs ordered are listed, but only abnormal results are displayed) Labs Reviewed  RESP PANEL BY RT-PCR (FLU A&B, COVID) [id number]  CBC WITH DIFFERENTIAL/PLATELET  BASIC METABOLIC PANEL    EKG None  Radiology DG Chest 2 View  Result Date:  02/02/2021 CLINICAL DATA:  Short of breath.  Cough.  History of COPD. EXAM: CHEST - 2 VIEW COMPARISON:  03/23/2020 FINDINGS: Cardiac silhouette is normal in size and configuration. No mediastinal or hilar masses. No adenopathy. Lungs are hyperexpanded. Chronic scarring noted in the lower lungs with changes of emphysema most evident in the upper lobes. No evidence of pneumonia or pulmonary edema. No pleural effusion or pneumothorax. Skeletal structures are intact. IMPRESSION: 1. No acute cardiopulmonary disease. 2. Advanced COPD. Electronically Signed   By: Amie Portland M.D.   On: 02/02/2021 18:46    Procedures Procedures   Medications Ordered in ED Medications  ipratropium-albuterol (DUONEB) 0.5-2.5 (3) MG/3ML nebulizer solution 3 mL (3 mLs Nebulization Given 02/02/21 1636)  methylPREDNISolone sodium succinate (SOLU-MEDROL) 125 mg/2 mL injection 125 mg (125 mg Intravenous Given 02/02/21 1833)  aerochamber plus with mask device 1 each (1 each Other Given 02/02/21 2010)    ED Course  I have reviewed the triage vital signs and the nursing notes.  Pertinent labs & imaging results that were available during my care of the patient were reviewed by me and considered in my medical decision making (see chart for details).    MDM Rules/Calculators/A&P                          [name] [name] is a 70 y.o. female who presents to the emergency department with shortness of breath and likely COPD exacerbation.  She was evaluated in triage and I ordered 2 DuoNeb's at that time which greatly improved her symptoms.  On my evaluation when she got back into her room, patient's shortness of breath is completely resolved.  Given that she does have history of COPD and has been having upper respiratory symptoms of the last several days I will get some basic labs and a chest x-ray to further evaluate.  I will also give her 125 mg of Solu-Medrol with plans to discharge her home with prednisone and antibiotics.  CBC,  BMP, and respiratory panel were all negative.  Chest x-ray was clear.  Patient is feeling much better after nebulizer treatments and Solu-Medrol.  I will give her a short course of prednisone and a prescription with azithromycin.  Patient will call her pulmonologist and schedule appointment for next week.  Strict turn precautions given.  She is safe for discharge.   Final Clinical Impression(s) / ED Diagnoses Final diagnoses:  Shortness of breath    Rx / DC Orders ED Discharge Orders          Ordered    azithromycin (ZITHROMAX) 250 MG tablet  Daily        02/02/21 2028    predniSONE (DELTASONE) 10 MG tablet  Daily        02/02/21 2028             Teressa Lower, PA-C 02/02/21 2029    Melene Plan, DO 02/02/21 2040

## 2021-02-19 ENCOUNTER — Telehealth: Payer: Self-pay | Admitting: Internal Medicine

## 2021-02-19 DIAGNOSIS — J449 Chronic obstructive pulmonary disease, unspecified: Secondary | ICD-10-CM

## 2021-02-20 MED ORDER — ALBUTEROL SULFATE (2.5 MG/3ML) 0.083% IN NEBU: 2.5000 mg | INHALATION_SOLUTION | Freq: Four times a day (QID) | RESPIRATORY_TRACT | 5 refills | Status: DC | PRN

## 2021-02-20 NOTE — Telephone Encounter (Signed)
Rx has been sent to preferred pharmacy for pt. Called and spoke with [name] [name] letting him know this had been done and he verbalized understanding. Stated to him that we could cancel pt's appt if she did not need it and he said that he feels like she needs to be seen tomorrow so stated that we would keep pt's appt as is. Nothing further needed.

## 2021-02-21 ENCOUNTER — Ambulatory Visit (INDEPENDENT_AMBULATORY_CARE_PROVIDER_SITE_OTHER): Payer: 59 | Admitting: Internal Medicine

## 2021-02-21 ENCOUNTER — Other Ambulatory Visit: Payer: Self-pay

## 2021-02-21 ENCOUNTER — Encounter: Payer: Self-pay | Admitting: Internal Medicine

## 2021-02-21 DIAGNOSIS — Z720 Tobacco use: Secondary | ICD-10-CM | POA: Insufficient documentation

## 2021-02-21 DIAGNOSIS — J449 Chronic obstructive pulmonary disease, unspecified: Secondary | ICD-10-CM

## 2021-02-21 DIAGNOSIS — Z87891 Personal history of nicotine dependence: Secondary | ICD-10-CM | POA: Diagnosis not present

## 2021-02-21 DIAGNOSIS — F17200 Nicotine dependence, unspecified, uncomplicated: Secondary | ICD-10-CM | POA: Insufficient documentation

## 2021-02-21 HISTORY — DX: Nicotine dependence, unspecified, uncomplicated: F17.200

## 2021-02-21 MED ORDER — PREDNISONE 10 MG PO TABS
ORAL_TABLET | ORAL | 5 refills | Status: DC
Start: 2021-02-21 — End: 2021-05-28
  Filled 2021-02-21: qty 14, 6d supply, fill #0

## 2021-02-21 NOTE — Assessment & Plan Note (Addendum)
Quit smoking 02/2021  - 11/10/2014  new maint rx = symb 160/incruse sample - Alpha one AT 11/24/2014 >  MM/ nl levels - 01/31/2015  extensive coaching HFA effectiveness =    90%  - spirometry 01/31/2015  FEV1  0.85 (37%) ratio 34  - Spirometry 08/29/2016  FEV1 0.71 (30%)  Ratio 36  > added back LAMA = incruse samples  - 06/12/2020    continue breztri   - 02/21/2021  After extensive coaching inhaler device,  effectiveness =    90%  - 02/21/2021 added pred x 6 days as Plan C on action plan   Group D in terms of symptom/risk and laba/lama/ICS  therefore appropriate rx at this point >>>  Continue breztri and approp saba (overuisng at baseline then struggling with flares which hopefully will be few since d/c cigs) plus new Plan C-  see avs for instructions unique to this ov    Re SABA :  I spent extra time with pt today reviewing appropriate use of albuterol for prn use on exertion with the following points: 1) saba is for relief of sob that does not improve by walking a slower pace or resting but rather if the pt does not improve after trying this first. 2) If the pt is convinced, as many are, that saba helps recover from activity faster then it's easy to tell if this is the case by re-challenging : ie stop, take the inhaler, then p 5 minutes try the exact same activity (intensity of workload) that just caused the symptoms and see if they are substantially diminished or not after saba 3) if there is an activity that reproducibly causes the symptoms, try the saba 15 min before the activity on alternate days   If in fact the saba really does help, then fine to continue to use it prn but advised may need to look closer at the maintenance regimen being used to achieve better control of airways disease with exertion.    rec  2nd opinion by Dr Patsey Berthold to see if the language barrier is the issue or not or her train of thoughts issue is more emotional but I could not understand most of her issues today even  though her English seemed reasonable Instructions given in [place] by Carrizales

## 2021-02-21 NOTE — Patient Instructions (Addendum)
Plan A = Automatic = Always=    Breztri Take 2 puffs first thing in am and then another 2 puffs about 12 hours later.     Plan B = Backup (to supplement plan A, not to replace it) Only use your albuterol inhaler as a rescue medication to be used if you can't catch your breath by resting or doing a relaxed purse lip breathing pattern.  - The less you use it, the better it will work when you need it. - Ok to use the inhaler up to 2 puffs  every 4 hours if you must but call for appointment if use goes up over your usual need - Don't leave home without it !!  (think of it like the spare tire for your car)   Plan C = Crisis (instead of Plan B but only if Plan B stops working) - only use your albuterol nebulizer if you first try Plan B and it fails to help > ok to use the nebulizer up to every 4 hours but if start needing it regularly call for immediate appointment  Keep up the good work avoiding smoking    Please schedule an office visit in  6 weeks to See Dr Jayme Cloud in the [facility] with all your medications in hand    Address: [address], [place], [place] [address] Phone: [phone]

## 2021-02-21 NOTE — Assessment & Plan Note (Addendum)
Quit 02/2021 at age 71 - referred for annual screeing due 02/2021  Low-dose CT lung cancer screening is recommended for patients who are 80-26 years of age with a 20+ pack-year history of smoking, and who are currently smoking or quit <=15 years ago. No coughing up blood  No unintentional weight loss of > 15 pounds in the last 6 months  >>> eligible for the next 11 years > referred for early decision making          Each maintenance medication was reviewed in detail including emphasizing most importantly the difference between maintenance and prns and under what circumstances the prns are to be triggered using an action plan format where appropriate.  Total time for H and P, chart review, counseling, reviewing hfa device(s) and generating customized AVS unique to this office visit / same day charting = 31 min

## 2021-02-21 NOTE — Progress Notes (Signed)
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 Pt states that she has increased dyspnea with exertion. Pt c/o of occasional wet cough with yellow to clear mucus and chest pain with breathing. Pt denies any wheeze.   still way overusing saba at baseline mostly in neb form with doe x across the room and not using symbicort or pain meds correctly (see a/p) rec Plan A = Automatic = Symbiocrt 160 one twice daily and Incruse one click each am  Plan B = Backup - Only use your albuterol (Yellow/proventil) as a rescue medication Plan C = Crisis/ contingency - nebulizer, ok use with albuterol (or combination until you use it up)  every 4 hours if plan B is not working  For pain > tramadol 50 mg up to 2 every 4 hours  For nerves> alprazolam 0.5 mg up to one every 4 hours      11/24/2014  f/u ov/[name] [name] re: copd/ pfts on symb/ incruse still using saba  Chief Complaint  Patient presents with   Follow-up    pt following for COPD: pt states she is doing pretty well, she is still having pain on her left side, but it [place] much better.  pt states her breathing has been very well. pt c/o increase SOB when she exerts herself. no c/o chest tightness. pt needs refills on meds.    doe = MMRC1 = can walk nl pace, flat grade, can't hurry or go uphills or steps s sob  rec Please remember to go to  the lab department downstairs for your tests - we will call you with the results when they are available. Symbicort 160 Take1 puffs first thing in am and then another1 puffs about 12 hours later.  Goal is not to need to rescue albuterol  much at all      01/31/2015  f/u ov/[name] [name] re: COPD III/ symbicort 160 2bid/ incruse and prn saba  Chief Complaint  Patient presents with   Follow-up    Pt states that breathing is unchanged since last visit. Pt states that she is under a lot of stress right now which has made her breathing worse   2x daily neb but not noct/ no hfa use  Pain L cw same as post op never improved, very sensitive chest wall / not resp to tramadol so far rec Please see patient coordinator before you leave today  to schedule referral to Internal Medicine/ Dr [name] [name]  Zostrix cream 4 x daily (over the counter) - as improves can reduce to bedtime only  Plan A = Automatic = Symbiocrt 160 one twice daily and Incruse one click each am  Plan B = Backup - Only use your albuterol (Yellow/proventil) as a rescue medication Plan C = Crisis/ contingency - nebulizer, ok use with albuterol (or combination until you use it up)  every 4 hours if plan B is not working    08/29/2016  f/u ov/[name] [name] re:   COPD GOLD III/ quit smoking  02/2016 Chief Complaint  Patient presents with   Follow-up    l/s 01/2015- pt stopped smoking X6 mos ago, noted weight gain, since then c/o increased SOB, sinus congestion, PND, hoarseness.    walking 30 min s stopping sev times a week better if use albturol first despite using symbicort 160 2bid  Does not recall response to prednisone or incruse  Easily confused with details of care / access to meds issues  Doe = MMRC2 = can't walk a nl pace on a flat grade s sob but does fine slow and flat  rec Please see patient coordinator before you leave today  to schedule referral to Internal Medicine/ Dr [name] [name]  Zostrix cream 4 x daily (over the counter) - as improves can reduce to  bedtime only  Plan A = Automatic = Symbiocrt 160 one twice daily and Incruse one click each am  Plan B = Backup - Only use your albuterol (Yellow/proventil) as a rescue medication Plan C = Crisis/ contingency - nebulizer, ok use with albuterol (or combination until you use it up)  every 4 hours if plan B is not working  Please schedule a follow up visit in 3 months but call sooner if needed - once establish with Dr [name] [name] follow up here can be as needed  Late add: confused as to whether she uses the symb one bid vs 2 bid will leave things as they are right now due to communication barriers but ideally the dose should be 2 pffs every 12 hours unless can't tolerate for some reason     12/24/2018  f/u ov/[name] [name] re: re-establish re COPD GOLD III/ resumed smoking / problems with access to meds  Chief Complaint  Patient presents with   Consult    Patient is here for COPD. Patient has started smoking again since the virus started and want's help to stop.   Dyspnea:  Has a 15 min course around house s stopping Cough: none  Sleeping: ok flat s respiratory symptoms  SABA use: avg twice daily  02: none  rec Plan A = Automatic = Symbicort 160 Take 2 puffs first thing in am and then another 2 puffs about 12 hours later.  And when available Incruse each am  (an option that has both is called Trelegy)  Plan B = Backup - Only use your albuterol (Yellow/proventil) as a rescue medication  Plan C = Crisis/ contingency - nebulizer, ok use with albuterol (or combination until you use it up)  every 4 hours if plan B is not working       06/27/2019  f/u ov/[name] [name] re:  GOLD III/ still smoking   breztri maint Chief Complaint  Patient presents with   Follow-up    Breathing is overall doing well. She has occ cough with white sputum.  She is using her albuterol about 2 x per day.   Dyspnea:  Walking outside x 30 min  Cough: smoker's rattle worse in am's/ min mucoid Sleeping: flat bed one bed  SABA use: twice a  day  02: none  rec The key is to stop smoking completely before smoking completely stops you! For smoking cessation info  call [phone]  Try albuterol 15 min before an activity that you know would make you short of breath and see if it makes any difference and if makes none  then don't take it after activity unless you can't catch your breath. I strongly recommend you take the vaccine as soon as possible    Onset symptoms was 02/24/20  Admit date: 03/02/2020 Discharge date: 03/03/2020   Principal Problem:   Acute respiratory failure due to COVID-19 [facility])   COPD GOLD III   Morbid obesity due to excess calories (HCC)   Anxiety state   Cigarette smoker           History of Present Illness:    [name] [name] is a 71 y.o. female with medical history significant of COPD, diverticular disease, anxiety disorder, tobacco abuse presented to the hospital with shortness of breath for 7 days.  He was initially noted to be hypoxic in the ED and was admitted to hospital.   CBC and chemistry appear to be within normal LDH 168 triglyceride 58 ferritin 154 CRP 4.4 and lactic acid 0.7.  Chest x-ray showed emphysema with minimal patchy airspace disease right greater than left.  CT angiogram of the chest showed no PE or definite infiltrate.  Severe emphysema.  Patient was being admitted to the hospital with acute hypoxia secondary to COVID-19 infection.   Hospital Course:    Following conditions were addressed during hospitalization as listed below,   Acute respiratory failure due to COVID-19 infection:   Patient was initially on supplemental oxygen which has been weaned off at this time.  Patient was started on  IV remdesivir, dexamethasone, supplemental oxygen and inhalers.   CTA chest without definite pneumonia.  Mild had mildly elevated CRP mild leukopenia.   Procalcitonin less than 0.10. hold baricitinib.  And was subsequently weaned off oxygen.  She did not wish to pursue further treatment in the  hospital but was clinically stable.  She was advised to seek medical attention for worsening symptoms.  She will be prescribed prednisone on discharge.   COPD: Gold 3 at baseline.    At this time.  Without supplemental oxygen.   Ongoing tobacco use disorder.    Was advised to quit smoking.    03/15/20 Televist p covid  For cough > mucinex dm up to 1200 mg every 12 hours as needed Doxycylcine 100 mg twice daily x 10 days Prednisone 10 mg take  4 each am x 2 days,   2 each am x 2 days,  1 each am x 2 days and stop  Only use nebulizer if can't catch your breath at rest>  Go to ER if still short of breath at rest after your nebulizer treatment   03/22/2020  f/u ov/Avice Funchess re: COPD GOLD III/ breztri one in am  Chief Complaint  Patient presents with   Follow-up    Cough has improved since the last visit- still prod in the am with light green sputum. Her SOB has improved. She is using her albuterol neb daily.   Dyspnea:  Can only walk 10 ft at home (note 750 ft here)  Cough: improving on doxy/ much less  purulent now Sleeping: flat bed with small pillow  SABA use: neb neb one or twice daily  02: none  Covid status:   No vaccination rec The key is to stop smoking completely before smoking completely stops you! Plan A = Automatic = Always=    Breztri Take 2 puffs first thing in am and then another 2 puffs about 12 hours later.  Work on inhaler technique: Plan B = Backup (to supplement plan A, not to replace it) Only use your  albuterol inhaler as a rescue medication Plan C = Crisis (instead of Plan B but only if Plan B stops working) - only use your albuterol nebulizer if you first try Plan B  Prednisone 10 mg take  4 each am x 2 days,   2 each am x 2 days,  1 each am x 2 days and stop  Please schedule a follow up office visit in 6 weeks, call sooner if needed with all medications /inhalers/ solutions in hand so we can verify exactly what you are taking. This includes all medications from all  doctors and over the counters       06/12/2020  f/u ov/Shann Lewellyn re: copd gold 3 /breztri still smoking  Chief Complaint  Patient presents with   Follow-up    Covid in dec. Still feeling tired, sob with exertion. Breztri working well.  Feels numbness in hands and feet   Dyspnea: improved on breztri (walked 750 ft last ov)  Cough: none  Sleeping: bed is flat with 1 pillow  SABA use: none  02: none  Covid status:   No  vax / refuses to consider Rec F/u prn    02/21/2021  f/u ov/Taivon Haroon re: GOLD 3 COPD quit smoking 02/2021  maint on breztri   Chief Complaint  Patient presents with   Follow-up    Had cold 3 wks.,feeling better, sob-better now  Dyspnea:  back to baseline p uri Cough: better  Sleeping: back in flat bed/ one pillow SABA use: rarely hfa/ neb twice      No obvious day to day or daytime variability or assoc excess/ purulent sputum or mucus plugs or hemoptysis or cp or chest tightness, subjective wheeze or overt sinus or hb symptoms.   Also denies any obvious fluctuation of symptoms with weather or environmental changes or other aggravating or alleviating factors except as outlined above   No unusual exposure hx or h/o childhood pna/ asthma or knowledge of premature birth.  Current Allergies, Complete Past Medical History, Past Surgical History, Family History, and Social History were reviewed in [employer] record.  ROS  The following are not active complaints unless bolded Hoarseness, sore throat, dysphagia, dental problems, itching, sneezing,  nasal congestion or discharge of excess mucus or purulent secretions, ear ache,   fever, chills, sweats, unintended wt loss or wt gain, classically pleuritic or exertional cp,  orthopnea pnd or arm/hand swelling  or leg swelling, presyncope, palpitations, abdominal pain, anorexia, nausea, vomiting, diarrhea  or change in bowel habits or change in bladder habits, change in stools or change in urine, dysuria,  hematuria,  rash, arthralgias, visual complaints, headache, numbness, weakness or ataxia or problems with walking or coordination,  change in mood=anxious or  memory.        Current Meds  Medication Sig   acetaminophen (TYLENOL) 500 MG tablet Take 1 tablet (500 mg total) by mouth every 6 (six) hours as needed.   albuterol (PROAIR HFA) 108 (90 Base) MCG/ACT inhaler 2 puffs every 4 hours as needed only  if your can't catch your breath   albuterol (PROVENTIL) (2.5 MG/3ML) 0.083% nebulizer solution Take 3 mLs (2.5 mg total) by nebulization every 6 (six) hours as needed for wheezing or shortness of breath.   aspirin 325 MG tablet Take 325 mg by mouth every 4 (four) hours as needed for mild pain, moderate pain, headache or fever.   atorvastatin (LIPITOR) 20 MG tablet TAKE 1 TABLET (20 MG TOTAL) BY MOUTH DAILY AT 6  PM.   Budeson-Glycopyrrol-Formoterol (BREZTRI AEROSPHERE) 160-9-4.8 MCG/ACT AERO Inhale 2 puffs into the lungs in the morning and at bedtime.   buPROPion (WELLBUTRIN SR) 150 MG 12 hr tablet Take 1 tablet (150 mg total) by mouth 2 (two) times daily. (Patient taking differently: Take 150 mg by mouth 2 (two) times daily. For wanting to stop smoking)   cetirizine (ZYRTEC) 10 MG tablet Take 1 tablet (10 mg total) by mouth daily. (Patient taking differently: Take 10 mg by mouth daily as needed for allergies.)   diclofenac sodium (VOLTAREN) 1 % GEL Apply 2 g topically 4 (four) times daily.   fluticasone (FLONASE) 50 MCG/ACT nasal spray Place 2 sprays into both nostrils daily.   guaiFENesin-dextromethorphan (ROBITUSSIN DM) 100-10 MG/5ML syrup Take 10 mLs by mouth every 4 (four) hours as needed for cough.   hydrOXYzine (ATARAX/VISTARIL) 10 MG tablet Take 1 tablet (10 mg total) by mouth every 4 (four) hours as needed.   ibuprofen (ADVIL,MOTRIN) 200 MG tablet Take 400 mg by mouth every 6 (six) hours as needed for headache or moderate pain.           Objective:   Physical Exam   02/21/2021        168    06/12/2020       179 03/22/2020        176 06/27/2019        184   12/24/2018    180  11/24/2014      177 > 01/31/2015 180 >  08/29/2016    190     11/10/14 177 lb 3.2 oz (80.377 kg)  11/08/14 178 lb 5 oz (80.882 kg)  10/26/14 177 lb 1.6 oz (80.332 kg)    1/19/2023Vital signs reviewed  02/21/2021  - Note at rest 02 sats  96% on RA   General appearance:    anxious amb latina, very difficult to follow trains of thought     HEENT : pt wearing mask not removed for exam due to covid - 19 concerns.    NECK :  without JVD/Nodes/TM/ nl carotid upstrokes bilaterally   LUNGS: no acc muscle use,  Mild barrel  contour chest wall with bilateral  Distant bs s audible wheeze and  without cough on insp or exp maneuvers  and mild  Hyperresonant  to  percussion bilaterally     CV:  RRR  no s3 or murmur or increase in P2, and no edema   ABD:  soft and nontender with pos end  insp Hoover's  in the supine position. No bruits or organomegaly appreciated, bowel sounds nl  MS:   Nl gait/  ext warm without deformities, calf tenderness, cyanosis or clubbing No obvious joint restrictions   SKIN: warm and dry without lesions    NEURO:  alert, approp, nl sensorium with  no motor or cerebellar deficits apparent.        I personally reviewed images and agree with radiology impression as follows:   Chest CTa  03/02/20 1. Exam is limited by respiratory motion artifact. Within that limitation, there is no central pulmonary embolus. 2. There are severe emphysematous changes bilaterally.       Assessment & Plan:

## 2021-02-22 ENCOUNTER — Other Ambulatory Visit: Payer: Self-pay | Admitting: Internal Medicine

## 2021-02-22 DIAGNOSIS — J449 Chronic obstructive pulmonary disease, unspecified: Secondary | ICD-10-CM

## 2021-02-28 ENCOUNTER — Other Ambulatory Visit: Payer: Self-pay

## 2021-04-09 ENCOUNTER — Ambulatory Visit: Payer: 59 | Admitting: Pulmonary Disease

## 2021-04-09 ENCOUNTER — Telehealth: Payer: Self-pay | Admitting: Internal Medicine

## 2021-04-09 NOTE — Telephone Encounter (Signed)
That is okay.

## 2021-04-09 NOTE — Telephone Encounter (Signed)
Okay to schedule NP appt.  

## 2021-04-09 NOTE — Telephone Encounter (Signed)
Lvm to schedule appt.

## 2021-04-09 NOTE — Telephone Encounter (Signed)
Please advise 

## 2021-04-09 NOTE — Telephone Encounter (Signed)
Patient came in office and asked if she could be seen by [facility] [place] [facility] again.  ? ?She would like him as PCP ? ?[phone]- best number to call to schedule if its okay  ?

## 2021-04-22 ENCOUNTER — Telehealth: Payer: Self-pay

## 2021-04-22 NOTE — Telephone Encounter (Signed)
No such medication - if not happy with present rx needs to make appt with all active inahlers/solutions in hand to regroup  ?

## 2021-04-22 NOTE — Telephone Encounter (Signed)
Called and spoke to [facility]) about patients [place] patient assistance forms. Received request for patient to be re enrolled. He would like papers mailed to home and will bring them to [place] when completed.  ? ?Pts son also states that patient wanted to know if she could change nebulizer to mylan (?) neb solution. Patient states she has tried this in the past and this worked better for her than the albuterol nebs do.  ? ?Dr. Sherene Sires please advise  ?

## 2021-04-23 NOTE — Telephone Encounter (Signed)
Called and spoke to pt's son, Nancy Marus. Informed him of the information per Dr. Sherene Sires. Mylan is a Chartered loss adjuster for [employer]. Nancy Marus said the box had ipratropium/albuterol on the box. Advised Rahpael that the pt is due for an appt anyway. He states he will speak with her and he will call back. Will keep encounter open.  ?

## 2021-05-06 ENCOUNTER — Ambulatory Visit: Payer: 59 | Admitting: Primary Care

## 2021-05-09 ENCOUNTER — Encounter: Payer: Self-pay | Admitting: Primary Care

## 2021-05-09 ENCOUNTER — Ambulatory Visit (INDEPENDENT_AMBULATORY_CARE_PROVIDER_SITE_OTHER): Payer: 59 | Admitting: Primary Care

## 2021-05-09 DIAGNOSIS — J449 Chronic obstructive pulmonary disease, unspecified: Secondary | ICD-10-CM

## 2021-05-09 MED ORDER — IPRATROPIUM-ALBUTEROL 0.5-2.5 (3) MG/3ML IN SOLN: 3.0000 mL | Freq: Four times a day (QID) | RESPIRATORY_TRACT | 2 refills | Status: DC | PRN

## 2021-05-09 NOTE — Patient Instructions (Signed)
Recommendations ?Plan A : Continue Breztri 2 puffs in the morning and at bedtime ?Plan B: Use albuterol rescue inhaler 2 puffs every 4 hours as needed for breakthrough shortness of breath only ?Plan C: Use ipratropium albuterol nebulizer every 6 hours as needed for for shortness of breath or wheezing if rescue inhaler ineffective ? ?Follow-up: ?4 months with Dr. Melvyn Novas  ?

## 2021-05-09 NOTE — Progress Notes (Signed)
? ?@Patient  ID: [name] [name], female    DOB: 11/28/1950, 71 y.o.   MRN: [id number] ? ?Chief Complaint  ?Patient presents with  ? Follow-up  ?  Needs refill on Neb. Medicine-sob, cough occass.  ? ? ?Referring provider: ?Hoy RegisterNewlin, Enobong, MD ? ?HPI: ?[id number] female, former smoker (quit smoking in January 2023). PMH COPD Gold III, respiratory failure, spontaneous tension pneumothorax, CVA, obesity. Patient of Dr. Dionicio StallWertt, last seen on 02/21/21. Spirometry 08/29/2016>> FEV1 0.71 (30%), ratio 36. Maintained on Breztri and albuterol hfa/neb. Referred for annual Lung cancer screening.  ? ?05/09/2021- Interim ?Patient presents today for medication refill. She is doing well today, no acute complaints. No active cough or sputum production. She will occasionally have some chest tightness. Sounds as though she uses albuterol nebulizer twice a day. Rarely requires her rescue inhaler. She is asking for refill ipratropium-albuterol nebulizer, states that it works better than plain albuterol. She does not use this medication everyday, would like to have for emergency use only. [facility] and would like to stay with [place]. [place].  ? ? ?No Known Allergies ? ?Immunization History  ?Administered Date(s) Administered  ? Influenza,inj,Quad PF,6+ Mos 11/04/2014, 12/19/2015, 12/29/2016  ? Pneumococcal Conjugate-13 12/29/2016  ? Pneumococcal Polysaccharide-23 11/04/2014  ? Td 02/03/2001  ? Tdap 01/01/2016  ? ? ?Past Medical History:  ?Diagnosis Date  ? Anxiety   ? Complication of anesthesia   ? COPD (chronic obstructive pulmonary disease) (HCC)   ? Diverticulitis   ? Dyspnea   ? Migraines   ? ? ?Tobacco History: ?Social History  ? ?Tobacco Use  ?Smoking Status Some Days  ? Packs/day: 1.00  ? Types: Cigarettes  ? Start date: 04/23/2018  ? Last attempt to quit: 02/03/2021  ? Years since quitting: 0.2  ?Smokeless Tobacco Never  ?Tobacco Comments  ? started age in her late teens; +/- 1 ppd  ? ?Ready to quit:  Not Answered ?Counseling given: Not Answered ?Tobacco comments: started age in her late teens; +/- 1 ppd ? ? ?Outpatient Medications Prior to Visit  ?Medication Sig Dispense Refill  ? acetaminophen (TYLENOL) 500 MG tablet Take 1 tablet (500 mg total) by mouth every 6 (six) hours as needed. 30 tablet 0  ? albuterol (PROAIR HFA) 108 (90 Base) MCG/ACT inhaler 2 puffs every 4 hours as needed only  if your can't catch your breath 18 g 2  ? aspirin 325 MG tablet Take 325 mg by mouth every 4 (four) hours as needed for mild pain, moderate pain, headache or fever.    ? Budeson-Glycopyrrol-Formoterol (BREZTRI AEROSPHERE) 160-9-4.8 MCG/ACT AERO Inhale 2 puffs into the lungs in the morning and at bedtime. 32.1 g 3  ? buPROPion (WELLBUTRIN SR) 150 MG 12 hr tablet Take 1 tablet (150 mg total) by mouth 2 (two) times daily. (Patient taking differently: Take 150 mg by mouth 2 (two) times daily. For wanting to stop smoking) 120 tablet 6  ? cetirizine (ZYRTEC) 10 MG tablet Take 1 tablet (10 mg total) by mouth daily. (Patient taking differently: Take 10 mg by mouth daily as needed for allergies.) 30 tablet 1  ? guaiFENesin-dextromethorphan (ROBITUSSIN DM) 100-10 MG/5ML syrup Take 10 mLs by mouth every 4 (four) hours as needed for cough. 118 mL 0  ? hydrOXYzine (ATARAX/VISTARIL) 10 MG tablet Take 1 tablet (10 mg total) by mouth every 4 (four) hours as needed. 40 tablet 1  ? ibuprofen (ADVIL,MOTRIN) 200 MG tablet Take 400 mg by mouth  every 6 (six) hours as needed for headache or moderate pain.    ? predniSONE (DELTASONE) 10 MG tablet Take 4 tablets by mouth in the morning for 2 days, then 2 tablets in the morning for 2 days, then 1 tablet in the morning for 2 days and stop. 14 tablet 5  ? albuterol (PROVENTIL) (2.5 MG/3ML) 0.083% nebulizer solution Take 3 mLs (2.5 mg total) by nebulization every 6 (six) hours as needed for wheezing or shortness of breath. 90 mL 5  ? atorvastatin (LIPITOR) 20 MG tablet TAKE 1 TABLET (20 MG TOTAL) BY MOUTH  DAILY AT 6 PM. (Patient not taking: Reported on 05/09/2021) 30 tablet 3  ? diclofenac sodium (VOLTAREN) 1 % GEL Apply 2 g topically 4 (four) times daily. (Patient not taking: Reported on 05/09/2021) 100 g 2  ? fluticasone (FLONASE) 50 MCG/ACT nasal spray Place 2 sprays into both nostrils daily. (Patient not taking: Reported on 05/09/2021) 16 g 1  ? omeprazole (PRILOSEC OTC) 20 MG tablet Take 1 tablet (20 mg total) by mouth daily for 10 days. 10 tablet 0  ? ?No facility-administered medications prior to visit.  ? ?Review of Systems ? ?Review of Systems  ?Constitutional: Negative.   ?Respiratory:  Positive for chest tightness. Negative for cough and shortness of breath.   ?     Intermittent chest tightness   ?Cardiovascular: Negative.   ? ? ?Physical Exam ? ?BP 122/72 (BP Location: Left Arm, Cuff Size: Normal)   Pulse 82   Temp 98.2 ?F (36.8 ?C) (Temporal)   Ht 5\' 6"  (1.676 m)   Wt 166 lb (75.3 kg)   SpO2 96%   BMI 26.79 kg/m?  ?Physical Exam ?Constitutional:   ?   Appearance: Normal appearance.  ?HENT:  ?   Head: Normocephalic and atraumatic.  ?   Mouth/Throat:  ?   Mouth: Mucous membranes are moist.  ?   Pharynx: Oropharynx is clear.  ?Cardiovascular:  ?   Rate and Rhythm: Normal rate and regular rhythm.  ?Pulmonary:  ?   Breath sounds: No wheezing, rhonchi or rales.  ?   Comments: CTA ?Musculoskeletal:     ?   General: Normal range of motion.  ?   Cervical back: Normal range of motion and neck supple.  ?Skin: ?   General: Skin is warm and dry.  ?Neurological:  ?   General: No focal deficit present.  ?   Mental Status: She is alert and oriented to person, place, and time. Mental status is at baseline.  ?Psychiatric:     ?   Mood and Affect: Mood normal.     ?   Behavior: Behavior normal.     ?   Thought Content: Thought content normal.     ?   Judgment: Judgment normal.  ?  ? ?Lab Results: ? ?CBC ?   ?Component Value Date/Time  ? WBC 7.4 02/02/2021 1836  ? RBC 4.97 02/02/2021 1836  ? HGB 13.8 02/02/2021 1836  ? HCT  41.4 02/02/2021 1836  ? PLT 295 02/02/2021 1836  ? MCV 83.3 02/02/2021 1836  ? MCH 27.8 02/02/2021 1836  ? MCHC 33.3 02/02/2021 1836  ? RDW 13.0 02/02/2021 1836  ? LYMPHSABS 1.8 02/02/2021 1836  ? MONOABS 0.4 02/02/2021 1836  ? EOSABS 0.1 02/02/2021 1836  ? BASOSABS 0.0 02/02/2021 1836  ? ? ?BMET ?   ?Component Value Date/Time  ? NA 139 02/02/2021 1836  ? NA 145 (H) 02/15/2018 1553  ? K 3.9 02/02/2021  1836  ? CL 103 02/02/2021 1836  ? CO2 28 02/02/2021 1836  ? GLUCOSE 93 02/02/2021 1836  ? BUN 16 02/02/2021 1836  ? BUN 15 02/15/2018 1553  ? CREATININE 0.58 02/02/2021 1836  ? CALCIUM 9.1 02/02/2021 1836  ? GFRNONAA >60 02/02/2021 1836  ? GFRAA 98 02/15/2018 1553  ? ? ?BNP ?No results found for: BNP ? ?ProBNP ?No results found for: PROBNP ? ?Imaging: ?No results found. ? ? ?Assessment & Plan:  ? ?COPD GOLD III ?- Former smoker, quit in Jan 2023. FEV1 0.71 (37%). No acute symptoms today. Intermittent chest tightness. Maintained on Breztri Aerosphere two puffs twice daily. Using albuterol neb twice daily. Asking to switch to ipratropium-albuterol as it works better. Reviewed action plan with patient. FU in 4 months with Dr. Sherene Sires or sooner if needed (She is unable to go to [place] office)  ? ? ?Glenford Bayley, NP ?05/09/2021 ? ?

## 2021-05-09 NOTE — Assessment & Plan Note (Signed)
-   Former smoker, quit in Jan 2023. FEV1 0.71 (37%). No acute symptoms today. Intermittent chest tightness. Maintained on Breztri Aerosphere two puffs twice daily. Using albuterol neb twice daily. Asking to switch to ipratropium-albuterol as it works better. Reviewed action plan with patient. FU in 4 months with Dr. Melvyn Novas or sooner if needed (She is unable to go to [place] office)  ?

## 2021-05-15 ENCOUNTER — Telehealth: Payer: Self-pay | Admitting: Internal Medicine

## 2021-05-15 MED ORDER — BREZTRI AEROSPHERE 160-9-4.8 MCG/ACT IN AERO: 2.0000 | INHALATION_SPRAY | Freq: Two times a day (BID) | RESPIRATORY_TRACT | 11 refills | Status: DC

## 2021-05-15 NOTE — Telephone Encounter (Signed)
AZ&ME papers received from [place]. Printed RX for breztri and placed in Dr. Rolin Barry folder for him to sign when he comes in on Friday. Will update encounter once signed and faxed  ?

## 2021-05-17 NOTE — Telephone Encounter (Signed)
Forms have been signed and faxed to [employer]ME.  ?

## 2021-05-20 ENCOUNTER — Other Ambulatory Visit: Payer: Self-pay

## 2021-05-20 ENCOUNTER — Other Ambulatory Visit (HOSPITAL_COMMUNITY): Payer: Self-pay

## 2021-05-20 NOTE — Telephone Encounter (Addendum)
Received fax from [employer] for  BREZTRI  patient assistance, patient's application has been DENIED due to insurance already providing adequate coverage of medication. Per test claim, pt has $0.00 copay for [facility] when billed through her insurance. ? ?Denial letter sent to scan center. ?

## 2021-05-20 NOTE — Telephone Encounter (Signed)
Patient returned call and states that she is upset her AZ&ME was denied and states her insurance is "making her life complicated" She states she will call AZ&ME gave pt number and she also req. To change pharmacies to [facility] in [place]. She states that she doesn't want breztri filled at this time but will call and let us know if and when she wants rx sent in.  ?

## 2021-05-20 NOTE — Telephone Encounter (Signed)
ATC patient, LMTCB 

## 2021-05-23 ENCOUNTER — Encounter: Payer: Self-pay | Admitting: Family Medicine

## 2021-05-28 ENCOUNTER — Ambulatory Visit (INDEPENDENT_AMBULATORY_CARE_PROVIDER_SITE_OTHER): Payer: 59 | Admitting: Internal Medicine

## 2021-05-28 ENCOUNTER — Encounter: Payer: Self-pay | Admitting: Internal Medicine

## 2021-05-28 VITALS — BP 108/76 | HR 42 | Temp 97.8°F | Resp 18 | Ht 66.0 in | Wt 165.3 lb

## 2021-05-28 DIAGNOSIS — J449 Chronic obstructive pulmonary disease, unspecified: Secondary | ICD-10-CM

## 2021-05-28 DIAGNOSIS — F32A Depression, unspecified: Secondary | ICD-10-CM

## 2021-05-28 DIAGNOSIS — F419 Anxiety disorder, unspecified: Secondary | ICD-10-CM | POA: Diagnosis not present

## 2021-05-28 DIAGNOSIS — Z720 Tobacco use: Secondary | ICD-10-CM

## 2021-05-28 DIAGNOSIS — Z23 Encounter for immunization: Secondary | ICD-10-CM

## 2021-05-28 DIAGNOSIS — Z0000 Encounter for general adult medical examination without abnormal findings: Secondary | ICD-10-CM | POA: Insufficient documentation

## 2021-05-28 DIAGNOSIS — J9600 Acute respiratory failure, unspecified whether with hypoxia or hypercapnia: Secondary | ICD-10-CM

## 2021-05-28 NOTE — Progress Notes (Signed)
? ?Subjective:  ? ? Patient ID: [name] [name], female    DOB: Jul 01, 1950, 71 y.o.   MRN: [id number] ? ?DOS:  05/28/2021 ?Type of visit - description:  new pt ? ?Since she was last seen 2017, she has been under the care of pulmonology and another primary doctor ?Was diagnosed with COVID twice, last time was 02-2020, she was severely ill and admitted to the hospital. ?After that she was very weak for many months and is only recently that she is getting some of her strength back. ? ?Unfortunately she is back to smoking. ? ?Review of Systems ?See above  ? ?Past Medical History:  ?Diagnosis Date  ? Anxiety   ? Complication of anesthesia   ? COPD (chronic obstructive pulmonary disease) ([place])   ? COVID-19   ? X2  ? Diverticulitis   ? Dyspnea   ? Migraines   ? ? ?Past Surgical History:  ?Procedure Laterality Date  ? CATARACT EXTRACTION Left   ? CESAREAN SECTION    ? STAPLING OF BLEBS Left 10/31/2014  ? Procedure: STAPLING OF BLEBS;  Surgeon: Gaye Pollack, MD;  Location: [facility];  Service: Thoracic;  Laterality: Left;  ? VIDEO ASSISTED THORACOSCOPY Left 10/31/2014  ? Procedure: LEFT VIDEO ASSISTED THORACOSCOPY;  Surgeon: Gaye Pollack, MD;  Location: [facility];  Service: Thoracic;  Laterality: Left;  ? ?Social History  ? ?Socioeconomic History  ? Marital status: Divorced  ?  Spouse name: Not on file  ? Number of children: 1  ? Years of education: Some College  ? Highest education level: Not on file  ?Occupational History  ? Occupation: not working currently  ?Tobacco Use  ? Smoking status: Some Days  ?  Packs/day: 1.00  ?  Types: Cigarettes  ?  Start date: 04/23/2018  ?  Last attempt to quit: 02/03/2021  ?  Years since quitting: 0.3  ? Smokeless tobacco: Never  ? Tobacco comments:  ?  started age in her late teens; +/- 1 ppd  ?  Quit and restarted    ?Vaping Use  ? Vaping Use: Never used  ?Substance and Sexual Activity  ? Alcohol use: No  ?  Alcohol/week: 0.0 standard drinks  ? Drug use: No  ? Sexual activity: Not Currently  ?Other  Topics Concern  ? Not on file  ?Social History Narrative  ? Lives by self   ? Son, Barnie Alderman  ? Caffeine use: Coffee- 4-5 cups per day  ? From [place] , father from [place]  ? Living in [place] since the 90s  ?    ? ?Social Determinants of Health  ? ?Financial Resource Strain: Not on file  ?Food Insecurity: Not on file  ?Transportation Needs: Not on file  ?Physical Activity: Not on file  ?Stress: Not on file  ?Social Connections: Not on file  ?Intimate Partner Violence: Not on file  ? ? ?Current Outpatient Medications  ?Medication Instructions  ? acetaminophen (TYLENOL) 500 mg, Oral, Every 6 hours PRN  ? albuterol (PROAIR HFA) 108 (90 Base) MCG/ACT inhaler 2 puffs every 4 hours as needed only  if your can't catch your breath  ? aspirin EC 81 mg, Oral, Daily, Swallow whole.  ? Budeson-Glycopyrrol-Formoterol (BREZTRI AEROSPHERE) 160-9-4.8 MCG/ACT AERO 2 puffs, Inhalation, 2 times daily  ? buPROPion (WELLBUTRIN SR) 150 mg, Oral, 2 times daily  ? Calcium Carbonate (CALCIUM 600 PO) Oral  ? carboxymethylcellulose (REFRESH PLUS) 0.5 % SOLN 1 drop, 3 times daily PRN  ? cetirizine (ZYRTEC) 10  mg, Oral, Daily  ? ibuprofen (ADVIL) 400 mg, Oral, Every 6 hours PRN  ? ipratropium-albuterol (DUONEB) 0.5-2.5 (3) MG/3ML SOLN 3 mLs, Nebulization, Every 6 hours PRN  ? omeprazole (PRILOSEC OTC) 20 mg, Oral, Daily  ? predniSONE (DELTASONE) 10 MG tablet Take 4 tablets by mouth in the morning for 2 days, then 2 tablets in the morning for 2 days, then 1 tablet in the morning for 2 days and stop.  ? ? ?   ?Objective:  ? Physical Exam ?BP 108/76 (BP Location: Left Arm, Patient Position: Sitting, Cuff Size: Small)   Pulse (!) 42   Temp 97.8 ?F (36.6 ?C) (Oral)   Resp 18   Ht 5\' 6"  (1.676 m)   Wt 165 lb 5 oz (75 kg)   SpO2 96%   BMI 26.68 kg/m?  ?General:   ?Well developed, NAD, BMI noted.  ?HEENT:  ?Normocephalic . Face symmetric, atraumatic ?Neck: No thyromegaly ?Lungs:  ?Decreased breath sounds normal respiratory effort, no intercostal  retractions, no accessory muscle use. ?Heart: RRR,  no murmur.  ?Abdomen:  ?Not distended, soft, non-tender. No rebound or rigidity.   ?Skin: Not pale. Not jaundice ?Lower extremities: no pretibial edema bilaterally  ?Neurologic:  ?alert & oriented X3.  ?Speech normal, gait appropriate for age and unassisted ?Psych--  ?Cognition and judgment appear intact.  ?Cooperative with normal attention span and concentration.  ?Behavior appropriate. ?No anxious or depressed appearing. ? ?   ?Assessment   ? ? Assessment ?Anxiety- depression ?COPD gold III dx ~ 2013 ?Smoker: h/o chantix intolerance ?S/p left VATS with apical bleb staping and mechanical pleurodesis on 10/31/2014.  ?Obesity  ?  ?PLAN: ?Here to go establish, [place] 2017, has been under the care of another physician. ?Anxiety depression: ?Had some challenges, lost her sister December 2022 and a brother last month. ?She takes Wellbutrin "as needed", declines to take wellbutrin BID. ?States she will do that if/when she decides to quit tobacco ?COPD: Closely follow-up planning pulmonology, currently seems to be doing well ?Smoking: The patient stopped tobacco for 3 years and went back to the smoke.  She has Wellbutrin and plans to use it when she is ready. ?Preventive care reviewed ?RTC CPX 4 months ? ?Time spent 32 minutes, extensive chart review. ?Also extensive listening therapy regards the loss of family members.  We talk about the benefits of daily Wellbutrin but eventually she declined it ? ?This visit occurred during the SARS-CoV-2 public health emergency.  Safety protocols were in place, including screening questions prior to the visit, additional usage of staff PPE, and extensive cleaning of exam room while observing appropriate contact time as indicated for disinfecting solutions.  ? ?

## 2021-05-28 NOTE — Patient Instructions (Addendum)
Please schedule Medicare Wellness visit.  ? ? ? ?GO TO THE FRONT DESK, PLEASE SCHEDULE YOUR APPOINTMENTS ?Come back for a physical exam in 4 months  ?

## 2021-05-29 ENCOUNTER — Encounter: Payer: Self-pay | Admitting: Internal Medicine

## 2021-05-29 NOTE — Assessment & Plan Note (Signed)
Td 2017 ?PNM 23: 2016 ?PNM 13 2019 ?PNM 20: Today ?Has declined COVID consistently ?Female care: ?Last mammogram 2018, does not plan to proceed for now.  She is afraid mammogram will hurt  severely because she has a scar from the VATS. ?Pap smear: 2017, states she will be very reluctant to have another one. ?CCS: Colonoscopy 2021 , hyperplastic polyp, 10 years per GI letter  ?

## 2021-05-29 NOTE — Assessment & Plan Note (Signed)
Here to go establish, LOV 2017, has been under the care of another physician. ?Anxiety depression: ?Had some challenges, lost her sister December 2022 and a brother last month. ?She takes Wellbutrin "as needed", declines to take wellbutrin BID. ?States she will do that if/when she decides to quit tobacco ?COPD: Closely follow-up planning pulmonology, currently seems to be doing well ?Smoking: The patient stopped tobacco for 3 years and went back to the smoke.  She has Wellbutrin and plans to use it when she is ready. ?Preventive care reviewed ?RTC CPX 4 months ?

## 2021-07-10 ENCOUNTER — Other Ambulatory Visit: Payer: Self-pay | Admitting: Primary Care

## 2021-08-30 ENCOUNTER — Telehealth: Payer: Self-pay | Admitting: Internal Medicine

## 2021-08-30 DIAGNOSIS — J449 Chronic obstructive pulmonary disease, unspecified: Secondary | ICD-10-CM

## 2021-08-30 NOTE — Telephone Encounter (Signed)
ATC patient's son. LVMTCB.

## 2021-09-02 MED ORDER — ALBUTEROL SULFATE HFA 108 (90 BASE) MCG/ACT IN AERS: INHALATION_SPRAY | RESPIRATORY_TRACT | 2 refills | Status: DC

## 2021-09-02 NOTE — Telephone Encounter (Signed)
Medication refilled. Nothing further needed  

## 2021-09-08 NOTE — Progress Notes (Signed)
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 Pt states that she has increased dyspnea with exertion. Pt c/o of occasional wet cough with yellow to clear mucus and chest pain with breathing. Pt denies any wheeze.   still way overusing saba at baseline mostly in neb form with doe x across the room and not using symbicort or pain meds correctly (see a/p) rec Plan A = Automatic = Symbiocrt 160 one twice daily and Incruse one click each am  Plan B = Backup - Only use your albuterol (Yellow/proventil) as a rescue medication Plan C = Crisis/ contingency - nebulizer, ok use with albuterol (or combination until you use it up)  every 4 hours if plan B is not working  For pain > tramadol 50 mg up to 2 every 4 hours  For nerves> alprazolam 0.5 mg up to one every 4 hours      11/24/2014  f/u ov/[name] [name] re: copd/ pfts on symb/ incruse still using saba  Chief Complaint  Patient presents with   Follow-up    pt following for COPD: pt states she is doing pretty well, she is still having pain on her left side, but it [place] much better.  pt states her breathing has been very well. pt c/o increase SOB when she exerts herself. no c/o chest tightness. pt needs refills on meds.    doe = MMRC1 = can walk nl pace, flat grade, can't hurry or go uphills or steps s sob  rec Please remember to go to  the lab department downstairs for your tests - we will call you with the results when they are available. Symbicort 160 Take1 puffs first thing in am and then another1 puffs about 12 hours later.  Goal is not to need to rescue albuterol  much at all        06/27/2019  f/u ov/[name] [name] re:  GOLD III/ still smoking   breztri maint Chief Complaint  Patient presents with   Follow-up    Breathing is overall doing well. She has occ cough with white sputum.  She is using her albuterol about 2 x per day.   Dyspnea:  Walking outside x 30 min  Cough: smoker's rattle worse in am's/ min mucoid Sleeping: flat bed one bed  SABA use: twice a day  02: none  rec The key is to stop smoking completely before smoking completely stops you! For smoking cessation info  call [phone]  Try albuterol 15 min before an activity that you know would make you short of breath and see if it makes any difference and if makes none then don't take it after activity unless you can't catch your breath. I strongly recommend you take the vaccine as soon as possible     03/22/2020  f/u ov/[name] [name] re: COPD GOLD III/ breztri one in am  Chief Complaint  Patient presents with   Follow-up    Cough has improved since the last visit- still prod in the am with light green sputum. Her SOB has improved. She is using her albuterol neb daily.   Dyspnea:  Can only walk 10 ft at home (note 750 ft here)  Cough: improving on doxy/ much less  purulent now Sleeping: flat bed with small pillow  SABA use: neb neb one or twice daily  02: none  Covid status:   No vaccination rec The key is to stop smoking completely before smoking completely stops you! Plan A = Automatic = Always=    Breztri Take 2 puffs first thing in am and then another 2 puffs about 12 hours later.  Work on inhaler technique: Plan B = Backup (to supplement plan A, not to replace it) Only use your albuterol inhaler as a rescue medication Plan C = Crisis (instead of Plan B  but only if Plan B stops working) - only use your albuterol nebulizer if you first try Plan B  Prednisone 10 mg take  4 each am x 2 days,   2 each am x 2 days,  1 each am x 2 days and stop  Please schedule a follow up office visit in 6 weeks, call sooner if needed with all medications /inhalers/ solutions in hand so we can verify exactly what you are taking. This includes all medications from all doctors and over the counters        02/21/2021  f/u ov/[name] [name] re: GOLD 3 COPD quit smoking 02/2021  maint on breztri   Chief Complaint  Patient presents with   Follow-up    Had cold 3 wks.,feeling better, sob-better now  Dyspnea:  back to baseline p uri Cough: better  Sleeping: back in flat bed/ one pillow SABA use: rarely hfa/ neb twice  Rec Plan A = Automatic = Always=    Breztri Take 2 puffs first thing in am and then another 2 puffs about 12 hours later.   Plan B = Backup (to supplement plan A, not to replace it) Only use your albuterol inhaler as a rescue medication  Plan C = Crisis (instead of Plan B but only if Plan B stops working) - only use your albuterol nebulizer if you first try Plan B and it fails to help  Keep up the good work avoiding smoking       05/09/21 np recs  Plan A : Continue Breztri 2 puffs in the morning and at bedtime Plan B: Use albuterol rescue inhaler 2 puffs every 4 hours as needed for breakthrough shortness of breath only Plan C: Use ipratropium albuterol nebulizer every 6 hours as needed for for shortness of breath or wheezing if rescue inhaler ineffective    09/09/2021  f/u ov/[name] [name] re: GOLD 3   maint on breztri  and not smoking but still very poor insight into how to use her meds  Chief Complaint  Patient presents with   Follow-up    Increased cough with green sputum x 2 wks. She also c/o wheezing and increased SOB.  She is using her albuterol inhaler daily.    Dyspnea:  more doe than usual assoc with cough x 2 weeks Cough: green mucus x 2 weeks assoc with  nasal congestion  Sleeping: flat bed one pillow  SABA use: hfa and  02: none  Covid status:  never vax    No obvious day to day or daytime variability or  assoc  mucus plugs or hemoptysis or cp or chest tightness, subjective wheeze or overt hb symptoms.   Sleeping  without nocturnal  or early am exacerbation  of respiratory  c/o's or need for noct saba. Also denies any obvious fluctuation of symptoms with weather or environmental changes or other aggravating or alleviating factors except as outlined above   No unusual exposure hx or h/o childhood pna/ asthma or knowledge of premature birth.  Current Allergies, Complete Past Medical History, Past Surgical History, Family History, and Social History were reviewed in [employer] record.  ROS  The following are not active complaints unless bolded Hoarseness, sore throat, dysphagia, dental problems, itching, sneezing,  nasal congestion or discharge of excess mucus or purulent secretions, ear ache,   fever, chills, sweats, unintended wt loss or wt gain, classically pleuritic or exertional cp,  orthopnea pnd or arm/hand swelling  or leg swelling, presyncope, palpitations, abdominal pain, anorexia, nausea, vomiting, diarrhea  or change in bowel habits or change in bladder habits, change in stools or change in urine, dysuria, hematuria,  rash, arthralgias, visual complaints, headache, numbness, weakness or ataxia or problems with walking or coordination,  change in mood or  memory.        Current Meds  Medication Sig   acetaminophen (TYLENOL) 500 MG tablet Take 1 tablet (500 mg total) by mouth every 6 (six) hours as needed.   albuterol (PROAIR HFA) 108 (90 Base) MCG/ACT inhaler 2 puffs every 4 hours as needed only  if your can't catch your breath   Budeson-Glycopyrrol-Formoterol (BREZTRI AEROSPHERE) 160-9-4.8 MCG/ACT AERO Inhale 2 puffs into the lungs in the morning and at bedtime.   buPROPion (WELLBUTRIN SR) 150 MG 12 hr tablet  Take 1 tablet (150 mg total) by mouth 2 (two) times daily. (Patient taking differently: Take 150 mg by mouth 2 (two) times daily. For wanting to stop smoking)   Calcium Carbonate (CALCIUM 600 PO) Take by mouth.   carboxymethylcellulose (REFRESH PLUS) 0.5 % SOLN 1 drop 3 (three) times daily as needed.   cetirizine (ZYRTEC) 10 MG tablet Take 1 tablet (10 mg total) by mouth daily. (Patient taking differently: Take 10 mg by mouth daily as needed for allergies.)   ibuprofen (ADVIL,MOTRIN) 200 MG tablet Take 400 mg by mouth every 6 (six) hours as needed for headache or moderate pain.   ipratropium-albuterol (DUONEB) 0.5-2.5 (3) MG/3ML SOLN INHALE 3ML VIA NEBULIZATION EVERY 6 HOURS AS NEEDED            Objective:   Physical Exam  Wts  09/09/2021          166  02/21/2021        168   06/12/2020       179 03/22/2020        176 06/27/2019        184   12/24/2018    180  11/24/2014      177 > 01/31/2015 180 >  08/29/2016    190     11/10/14 177 lb 3.2 oz (80.377 kg)  11/08/14 178 lb 5 oz (80.882 kg)  10/26/14 177 lb 1.6 oz (80.332 kg)    Vital signs reviewed  09/09/2021  - Note at rest 02 sats  98% on RA   General appearance:    amb latina easily confused with details of care    HEENT :  Oropharynx  clear  Nasal turbinates mod turbinate edema    NECK :  without JVD/Nodes/TM/ nl carotid  upstrokes bilaterally   LUNGS: no acc muscle use,  Mod barrel  contour chest wall with bilateral  Distant bs s audible wheeze and  without cough on insp or exp maneuvers and mod  Hyperresonant  to  percussion bilaterally     CV:  RRR  no s3 or murmur or increase in P2, and no edema   ABD:  soft and nontender with pos mid insp Hoover's  in the supine position. No bruits or organomegaly appreciated, bowel sounds nl  MS:   Ext warm without deformities or   obvious joint restrictions , calf tenderness, cyanosis or clubbing  SKIN: warm and dry without lesions    NEURO:  alert, approp, nl sensorium with  no motor  or cerebellar deficits apparent.               I personally reviewed images and agree with radiology impression as follows:   Chest CTa  03/02/20 1. Exam is limited by respiratory motion artifact. Within that limitation, there is no central pulmonary embolus. 2. There are severe emphysematous changes bilaterally.       Assessment & Plan:

## 2021-09-09 ENCOUNTER — Ambulatory Visit (INDEPENDENT_AMBULATORY_CARE_PROVIDER_SITE_OTHER): Payer: 59 | Admitting: Internal Medicine

## 2021-09-09 ENCOUNTER — Encounter: Payer: Self-pay | Admitting: Internal Medicine

## 2021-09-09 DIAGNOSIS — J449 Chronic obstructive pulmonary disease, unspecified: Secondary | ICD-10-CM | POA: Diagnosis not present

## 2021-09-09 DIAGNOSIS — Z87891 Personal history of nicotine dependence: Secondary | ICD-10-CM | POA: Insufficient documentation

## 2021-09-09 MED ORDER — AMOXICILLIN-POT CLAVULANATE 875-125 MG PO TABS: 1.0000 | ORAL_TABLET | Freq: Two times a day (BID) | ORAL | 0 refills | Status: DC

## 2021-09-09 MED ORDER — ALBUTEROL SULFATE (2.5 MG/3ML) 0.083% IN NEBU: 2.5000 mg | INHALATION_SOLUTION | RESPIRATORY_TRACT | 12 refills | Status: DC | PRN

## 2021-09-09 NOTE — Assessment & Plan Note (Signed)
Stopped smoking 02/2021   Low-dose CT lung cancer screening is recommended for patients who are 2-70 years of age with a 20+ pack-year history of smoking and who are currently smoking or quit <=15 years ago. No coughing up blood  No unintentional weight loss of > 15 pounds in the last 6 months - pt is eligible for scanning yearly until age 39 by present guidelines > referred for shared decision making       f/u  Can be prn with one of our spanish speaking pulmonologists and she does not process the ABC action plan even when written in Spanish and transcribed on the AVAS in simplest of terms   Each maintenance medication was reviewed in detail including emphasizing most importantly the difference between maintenance and prns and under what circumstances the prns are to be triggered using an action plan format where appropriate.  Total time for H and P, chart review, counseling, reviewing hfa/neb device(s) and generating customized AVS unique to this office visit / same day charting  > 30 min for multiple  refractory respiratory  symptoms of uncertain etiology

## 2021-09-09 NOTE — Assessment & Plan Note (Addendum)
Quit smoking 02/2021/ MM  - 11/10/2014  new maint rx = symb 160/incruse sample - Alpha one AT 11/24/2014 >  MM/ nl levels - 01/31/2015  extensive coaching HFA effectiveness =    90%  - spirometry 01/31/2015  FEV1  0.85 (37%) ratio 34  - Spirometry 08/29/2016  FEV1 0.71 (30%)  Ratio 36  > added back LAMA = incruse samples  - 06/12/2020    continue breztri  - 02/21/2021  After extensive coaching inhaler device,  effectiveness =    90%  - 02/21/2021 added pred x 6 days as Plan C on action plan> not using as of 09/09/2021   - The proper method of use, as well as anticipated side effects, of a metered-dose inhaler were discussed and demonstrated to the patient using teach back method. Improved effectiveness after extensive coaching during this visit to a level of approximately 80 % from a baseline of 60 %     Group D (now reclassified as E) in terms of symptom/risk and laba/lama/ICS  therefore appropriate rx at this point >>>  breztri and prn saba - 1st with hfa as Plan B and then albuterol (s ipatrpium) as planc C noting gets dry mouth and throat from using LAMA and SAMA   rx augmentinx 10 days/ mucinex dm 1200 mg bid and flutter valve for mild aecopd in setting of likely low grade underlying sinusitis.   F/u in this clinic can be prn - now that she's stopped smoking should do better but if not the first issue is achieving and maintaining consistent/ opitmal medication reconciliation which has been extremely challenging to date > return to PCP (which speaks spanish) and back here to see Dr Kendrick Fries since she declined second opinion from Dr Jayme Cloud in [place] (too far)

## 2021-09-09 NOTE — Patient Instructions (Addendum)
Plan A = Automatic = Always=    Breztri  Take 2 puffs first thing in am and then another 2 puffs about 12 hours later.    Work on inhaler technique:  relax and gently blow all the way out then take a nice smooth full deep breath back in, triggering the inhaler at same time you start breathing in.  Hold breath in for at least  5 seconds if you can. Blow out breztri thru nose. Rinse and gargle with water when done.  If mouth or throat bother you at all,  try brushing teeth/gums/tongue with arm and hammer toothpaste/ make a slurry and gargle and spit out.     Plan B = Backup (to supplement plan A, not to replace it) Only use your albuterol inhaler as a rescue medication to be used if you can't catch your breath by resting or doing a relaxed purse lip breathing pattern.  - The less you use it, the better it will work when you need it. - Ok to use the inhaler up to 2 puffs  every 4 hours if you must but call for appointment if use goes up over your usual need - Don't leave home without it !!  (think of it like the spare tire for your car)   Plan C = Crisis (instead of Plan B but only if Plan B stops working) - only use your albuterol (not ipatropium) nebulizer if you first try Plan B and it fails to help > ok to use the nebulizer up to every 4 hours but if start needing it regularly call for immediate appointment   Augmentin 875 mg take one pill twice daily  X 10 days - take at breakfast and supper with large glass of water.  It would help reduce the usual side effects (diarrhea and yeast infections) if you ate cultured yogurt at lunch.   Nasal saline as much as needed for dry nose  For cough > mucinex dm 1200 mg every 12 hours as needed   Pulmonary follow up is as needed with Dr Kendrick Fries  - bring all of your inhalers and solutions     Plan A = Automtico = Siempre = Breztri Tome 2 inhalaciones a primera hora de la maana y luego otras 2 inhalaciones unas 12 horas despus.   Trabaje en la  tcnica del inhalador: reljese y sople suavemente hasta el final, luego vuelva a inhalar profundamente, activando Therapist, nutritional al mismo tiempo que comienza a Visual merchandiser. Mantenga la respiracin durante al menos 5 segundos si puede. Sople breztri a travs de Architectural technologist. Enjuague y haga grgaras con agua cuando termine. Si le molestan la boca o la garganta, intente cepillarse los dientes/las encas/la lengua con pasta de dientes con brazo y martillo/haga una papilla y haga grgaras y escupa.    Plan B = Backup (para complementar el plan A, no para reemplazarlo) Solo use su inhalador de albuterol como medicamento de rescate si no puede recuperar el aliento descansando o haciendo un patrn de respiracin relajado con los labios fruncidos. - Cuanto menos lo uses, mejor funcionar cuando lo necesites. - Est bien usar Engineer, building services 2 inhalaciones cada 4 horas si es necesario, pero llame para programar una cita si el uso supera su necesidad habitual - No salgas de casa sin l!! (piense en ello como la llanta de repuesto para su automvil)  Plan C = Crisis (en lugar de Plan B pero solo si el Plan B deja de funcionar) -  solo use su nebulizador de albuterol si primero prueba el Plan B y no funciona > est bien usar el nebulizador hasta cada 4 horas, pero si comienza a necesitarlo regularmente, llame para una cita inmediata   Augmentin 875 mg tomar [employer] veces al da X 10 das - tomar en el desayuno y en la cena con un vaso grande de [place]. Ayudara a reducir los [employer] habituales (diarrea e infecciones por hongos) si comiera yogur cultivado en el almuerzo.  El seguimiento pulmonar es segn sea necesario con el Dr. Kendrick Fries o en [place] con el Dr. Jayme Cloud

## 2021-09-16 ENCOUNTER — Other Ambulatory Visit: Payer: Self-pay | Admitting: *Deleted

## 2021-09-16 MED ORDER — BREZTRI AEROSPHERE 160-9-4.8 MCG/ACT IN AERO: 2.0000 | INHALATION_SPRAY | Freq: Two times a day (BID) | RESPIRATORY_TRACT | 11 refills | Status: DC

## 2021-09-30 ENCOUNTER — Encounter: Payer: Self-pay | Admitting: Internal Medicine

## 2021-10-15 ENCOUNTER — Ambulatory Visit (INDEPENDENT_AMBULATORY_CARE_PROVIDER_SITE_OTHER): Payer: 59 | Admitting: Internal Medicine

## 2021-10-15 ENCOUNTER — Encounter: Payer: Self-pay | Admitting: Internal Medicine

## 2021-10-15 VITALS — BP 126/74 | HR 84 | Temp 98.0°F | Resp 18 | Ht 66.0 in | Wt 164.5 lb

## 2021-10-15 DIAGNOSIS — E785 Hyperlipidemia, unspecified: Secondary | ICD-10-CM | POA: Diagnosis not present

## 2021-10-15 DIAGNOSIS — Z87891 Personal history of nicotine dependence: Secondary | ICD-10-CM

## 2021-10-15 DIAGNOSIS — R739 Hyperglycemia, unspecified: Secondary | ICD-10-CM | POA: Diagnosis not present

## 2021-10-15 DIAGNOSIS — Z0000 Encounter for general adult medical examination without abnormal findings: Secondary | ICD-10-CM | POA: Diagnosis not present

## 2021-10-15 DIAGNOSIS — J449 Chronic obstructive pulmonary disease, unspecified: Secondary | ICD-10-CM

## 2021-10-15 DIAGNOSIS — Z09 Encounter for follow-up examination after completed treatment for conditions other than malignant neoplasm: Secondary | ICD-10-CM

## 2021-10-15 DIAGNOSIS — Z780 Asymptomatic menopausal state: Secondary | ICD-10-CM

## 2021-10-15 NOTE — Patient Instructions (Addendum)
Recommend to proceed with the following vaccines at your pharmacy:  Shingrix (shingles) RSV vaccine Flu shot  Start taking vitamin D over-the-counter supplements: 2000 units daily  GO TO THE LAB : Get the blood work     GO TO THE FRONT DESK, PLEASE SCHEDULE YOUR APPOINTMENTS Come back for   a checkup in 6 months  Read information about a living will

## 2021-10-15 NOTE — Assessment & Plan Note (Signed)
Here for CPX Anxiety-depression: Doing well today. COPD: Saw pulmonology recently, follow-up as needed, see pulmonology note Smoker: Has quit on and off, has not to smoking about a month. Praised .  Sees a dentist regularly. Stroke 2017: self d/c aspirin, apparently due to capillary fragility, explained that that aspirin is extremely important part of the cardiovascular risk reduction. Stated:  "I did not have a stroke".  Declined to restart aspirin. Hyperlipidemia: Checking labs today, history of a stroke, most likely I will recommend a statin and states she will be willing to try. Poor compliance with advice: Patient is declining many interventions I rec, benefit of each intervention such as ASA, early cancer detection was d/w pt, verbalized understanding but still declines . RTC 6 months

## 2021-10-15 NOTE — Progress Notes (Unsigned)
Subjective:    Patient ID: [name] [name], female    DOB: [birth year], 71 y.o.   MRN: [id number]  DOS:  10/15/2021 Type of visit - description: CPX  Here for CPX In general feeling well. Has quit tobacco on and off, has not smoked in the last few weeks. Reports occasional cough and occasional sputum production.  Sputum can be clear or greenish. Reports she can walk long distances as long as she goes slow.  She cannot walk fast, gets short of breath.  Review of Systems  Other than above, a 14 point review of systems is negative     Past Medical History:  Diagnosis Date   Anxiety    Complication of anesthesia    COPD (chronic obstructive pulmonary disease) (HCC)    COVID-19    X2   Diverticulitis    Dyspnea    Migraines     Past Surgical History:  Procedure Laterality Date   CATARACT EXTRACTION Left    CESAREAN SECTION     STAPLING OF BLEBS Left 10/31/2014   Procedure: STAPLING OF BLEBS;  Surgeon: Alleen Borne, MD;  Location: [facility] OR;  Service: Thoracic;  Laterality: Left;   VIDEO ASSISTED THORACOSCOPY Left 10/31/2014   Procedure: LEFT VIDEO ASSISTED THORACOSCOPY;  Surgeon: Alleen Borne, MD;  Location: [facility] OR;  Service: Thoracic;  Laterality: Left;   Social History   Socioeconomic History   Marital status: Divorced    Spouse name: Not on file   Number of children: 1   Years of education: Some College   Highest education level: Not on file  Occupational History   Occupation: not working currently  Tobacco Use   Smoking status: Former    Packs/day: 1.00    Types: Cigarettes    Quit date: 08/03/2021    Years since quitting: 0.2   Smokeless tobacco: Never   Tobacco comments:    started age in her late teens; +/- 1 ppd    Smokes on-off, very little   Vaping Use   Vaping Use: Never used  Substance and Sexual Activity   Alcohol use: No    Alcohol/week: 0.0 standard drinks of alcohol   Drug use: No   Sexual activity: Not Currently  Other Topics Concern   Not  on file  Social History Narrative   Lives by self    Son, [place]   Caffeine use: Coffee- 4-5 cups per day   From [place] , father from [place]   Living in [place] since the 90s       Social Determinants of Health   Financial Resource Strain: Not on file  Food Insecurity: Not on file  Transportation Needs: Not on file  Physical Activity: Not on file  Stress: Not on file  Social Connections: Not on file  Intimate Partner Violence: Not on file    Current Outpatient Medications  Medication Instructions   acetaminophen (TYLENOL) 500 mg, Oral, Every 6 hours PRN   albuterol (PROAIR HFA) 108 (90 Base) MCG/ACT inhaler 2 puffs every 4 hours as needed only  if your can't catch your breath   albuterol (PROVENTIL) 2.5 mg, Nebulization, Every 4 hours PRN   Budeson-Glycopyrrol-Formoterol (BREZTRI AEROSPHERE) 160-9-4.8 MCG/ACT AERO 2 puffs, Inhalation, 2 times daily   Calcium Carbonate (CALCIUM 600 PO) Oral   carboxymethylcellulose (REFRESH PLUS) 0.5 % SOLN 1 drop, 3 times daily PRN   cetirizine (ZYRTEC) 10 mg, Oral, Daily   Ergocalciferol 50 MCG (2000 UT) CAPS Oral   ibuprofen (ADVIL)  400 mg, Oral, Every 6 hours PRN   Multiple Vitamin (MULTIVITAMIN WITH MINERALS) TABS tablet 1 tablet, Oral, Daily       Objective:   Physical Exam BP 126/74   Pulse 84   Temp 98 F (36.7 C) (Oral)   Resp 18   Ht 5\' 6"  (1.676 m)   Wt 164 lb 8 oz (74.6 kg)   SpO2 94%   BMI 26.55 kg/m  General: Well developed, NAD, BMI noted Neck: No  thyromegaly  HEENT:  Normocephalic . Face symmetric, atraumatic Lungs:  decreased breath sounds bilaterally Normal respiratory effort, no intercostal retractions, no accessory muscle use. Heart: RRR,  no murmur.  Abdomen:  Not distended, soft, non-tender. No rebound or rigidity.   Lower extremities: no pretibial edema bilaterally  Skin: Exposed areas without rash. Not pale. Not jaundice Neurologic:  alert & oriented X3.  Speech normal, gait appropriate for age and  unassisted Strength symmetric and appropriate for age.  Psych: Cognition and judgment appear intact.  Cooperative with normal attention span and concentration.  Behavior appropriate. No anxious or depressed appearing.     Assessment     Assessment Anxiety- depression COPD gold III dx ~ 2013 Smoker: h/o chantix intolerance S/p left VATS with apical bleb staping and mechanical pleurodesis on 10/31/2014.  Obesity  Stroke 2017 Hyperlipidemia   PLAN: Here for CPX Anxiety-depression: Doing well today. COPD: Saw pulmonology recently, follow-up as needed, see pulmonology note Smoker: Has quit on and off, has not to smoking about a month. Praised .  Sees a dentist regularly. Stroke 2017: self d/c aspirin, apparently due to capillary fragility, explained that that aspirin is extremely important part of the cardiovascular risk reduction. Stated:  "I did not have a stroke".  Declined to restart aspirin. Hyperlipidemia: Checking labs today, history of a stroke, most likely I will recommend a statin and states she will be willing to try. Poor compliance with advice: Patient is declining many interventions I rec, benefit of each intervention such as ASA, early cancer detection was d/w pt, verbalized understanding but still declines . RTC 6 months   Td 2017 PNM 23: 2016 PNM 13 2019 PNM 20: 2023 Shingrix: Recommended.  Declined. COVID-vaccine: Declined again today Flu shot: Declined Female care: Last mammogram 2018, does not plan to proceed for now.  Benefits discussed. Pap smear: 2017, states she will be very reluctant to have another one. CCS: Colonoscopy 2021 , hyperplastic polyp, 10 years per GI letter  DEXA: She agreed to proceed Lung cancer screening: We will discuss with pulmonary (addendum, they agreed, will schedule CT) POA: Information in Spanish provided     Here to go establish, LOV 2017, has been under the care of another physician. Anxiety depression: Had some  challenges, lost her sister December 2022 and a brother last month. She takes Wellbutrin "as needed", declines to take wellbutrin BID. States she will do that if/when she decides to quit tobacco COPD: Closely follow-up planning pulmonology, currently seems to be doing well Smoking: The patient stopped tobacco for 3 years and went back to the smoke.  She has Wellbutrin and plans to use it when she is ready. Preventive care reviewed RTC CPX 4 months

## 2021-10-16 ENCOUNTER — Telehealth: Payer: Self-pay | Admitting: Internal Medicine

## 2021-10-16 LAB — LIPID PANEL
Cholesterol: 204 mg/dL — ABNORMAL HIGH (ref 0–200)
HDL: 58.7 mg/dL (ref >39.00)
LDL Cholesterol: 122 mg/dL — ABNORMAL HIGH (ref 0–99)
NonHDL: 145.32
Total CHOL/HDL Ratio: 3
Triglycerides: 116 mg/dL (ref 0.0–149.0)
VLDL: 23.2 mg/dL (ref 0.0–40.0)

## 2021-10-16 LAB — TSH: TSH: 2.52 u[IU]/mL (ref 0.35–5.50)

## 2021-10-16 LAB — COMPREHENSIVE METABOLIC PANEL
ALT: 13 U/L (ref 0–35)
AST: 20 U/L (ref 0–37)
Albumin: 3.8 g/dL (ref 3.5–5.2)
Alkaline Phosphatase: 93 U/L (ref 39–117)
BUN: 11 mg/dL (ref 6–23)
CO2: 27 mEq/L (ref 19–32)
Calcium: 9.7 mg/dL (ref 8.4–10.5)
Chloride: 102 mEq/L (ref 96–112)
Creatinine, Ser: 0.94 mg/dL (ref 0.40–1.20)
GFR: 61.72 mL/min (ref >60.00)
Glucose, Bld: 75 mg/dL (ref 70–99)
Potassium: 4.5 mEq/L (ref 3.5–5.1)
Sodium: 139 mEq/L (ref 135–145)
Total Bilirubin: 0.6 mg/dL (ref 0.2–1.2)
Total Protein: 6.9 g/dL (ref 6.0–8.3)

## 2021-10-16 LAB — HEMOGLOBIN A1C: Hgb A1c MFr Bld: 5.4 % (ref 4.6–6.5)

## 2021-10-16 NOTE — Assessment & Plan Note (Signed)
Td 2017 PNM 23: 2016 PNM 13 2019 PNM 20: 2023 Shingrix: Recommended.  Declined. COVID-vaccine: Declined again today Flu shot: Declined Female care: Last mammogram 2018, does not plan to proceed for now.  Benefits discussed. Pap smear: 2017, states she will be very reluctant to have another one. CCS: Colonoscopy 2021 , hyperplastic polyp, 10 years per GI letter  DEXA: She agreed to proceed Lung cancer screening: We will discuss with pulmonary (addendum, they agreed, will schedule CT) POA: Information in Spanish provided Labs:CMP, A1c, FLP, TSH. Healthy lifestyle was recommended

## 2021-10-16 NOTE — Telephone Encounter (Signed)
Patient's son called stating that patient was told that she would be getting a new doctor at our [facility] office from [place].  Please advise?

## 2021-10-18 ENCOUNTER — Telehealth: Payer: Self-pay | Admitting: Internal Medicine

## 2021-10-18 NOTE — Telephone Encounter (Signed)
Please advise- we do not have this med on her list.

## 2021-10-18 NOTE — Telephone Encounter (Signed)
Medication: buPROPion (WELLBUTRIN SR) 12 hr tablet 150 mg      Has the patient contacted their pharmacy? No.   Preferred Pharmacy (with phone number [facility] street [facility]  [address], [place] [address] Phone #[phone]    Agent: Please be advised that RX refills may take up to 3 business days. We ask that you follow-up with your pharmacy.

## 2021-10-21 ENCOUNTER — Telehealth: Payer: Self-pay

## 2021-10-21 ENCOUNTER — Telehealth: Payer: Self-pay | Admitting: Internal Medicine

## 2021-10-21 DIAGNOSIS — E785 Hyperlipidemia, unspecified: Secondary | ICD-10-CM

## 2021-10-21 MED ORDER — ATORVASTATIN CALCIUM 20 MG PO TABS: 20.0000 mg | ORAL_TABLET | Freq: Every day | ORAL | 0 refills | Status: DC

## 2021-10-21 MED ORDER — BUPROPION HCL 75 MG PO TABS: ORAL_TABLET | ORAL | 1 refills | Status: DC

## 2021-10-21 NOTE — Telephone Encounter (Signed)
Low cholesterol diet information in spanish mailed to Pt.

## 2021-10-21 NOTE — Telephone Encounter (Signed)
Initial Comment Caller states her doctor informed the caller that her cholesterol was higher than normal. The [facility] at [phone]. The doctor [facility] , but pharmacy does not have the script. Translation No Nurse Assessment Nurse: Jearld Pies, RN, Lovena Le Date/Time (Eastern Time): 10/19/2021 1:09:54 PM Please select the assessment type ---RX called in but not at pharm Additional Documentation ---Caller states her doctor informed the caller that her cholesterol was higher than normal. The [facility] at [phone]. The doctor [facility] but pharmacy does not have the script. Denies any new or worsening symptoms at this time. Document the name of the medication. ---??? Pharmacy name and phone number. ---[phone] Has the office closed within the last 30 minutes? ---No Does the client directives allow for assistance with medications after hours? ---No Additional Documentation ---Advised pt "Unable to address medications after hours. Please call back during normal business hours regarding medication prescriptions. Please call back 24/7 for triage of any new or worsening symptoms." Pt verbalizes understanding. Disp. Time Eilene Ghazi Time) Disposition Final User 10/19/2021 12:53:11 PM Send To Nurse Nicolasa Ducking, RN, Windy 10/19/2021 1:20:49 PM Clinical Call Yes Jearld Pies, RN, Lovena Le PLEASE NOTE: All timestamps contained within this report are represented as [place] Standard Time. CONFIDENTIALTY NOTICE: This fax transmission is intended only for the addressee. It contains information that is legally privileged, confidential or otherwise protected from use or disclosure. If you are not the intended recipient, you are strictly prohibited from reviewing, disclosing, copying using or disseminating any of this information or taking any action in reliance on or regarding this information. If you have received this fax in error,  please notify us immediately by telephone so that we can arrange for its return to [place]. Phone: [phone], Toll-Free: [phone], Fax: [phone] Page: 2 of 2 Call Id: [id number] Final Disposition 10/19/2021 1:20:49 PM Clinical Call Yes Jearld Pies, RN, Lovena Le Comments User: Malissa Hippo, RN Date/Time Eilene Ghazi Time): 10/19/2021 1:09:26 PM Spanish Interpreter utilized for call [phone]

## 2021-10-21 NOTE — Telephone Encounter (Signed)
ATC patients son. No answer. Left voicemail to call office back for more information.

## 2021-10-21 NOTE — Telephone Encounter (Signed)
[name] [name], [place]  10/18/2021  4:54 PM EDT Back to Top    Called again but no answer, left detail message in spanish for patient to call about results and about medication request   Colon Branch, MD  10/18/2021  3:41 PM EDT     [name], also she is asking for a refill on Wellbutrin. Please ask your reason.  Depression?   [name] [name], [place]  10/18/2021  2:41 PM EDT     Lvm with patient's son for her to call about her results   Colon Branch, MD  10/17/2021  8:19 PM EDT     [name] Please call the patient.  Recommend to start atorvastatin 20 mg 1 tablet daily, send a 2-month supply. Other labs are very good. Arrange for FLP, AST, ALT in 6 weeks. Thank you

## 2021-10-21 NOTE — Telephone Encounter (Signed)
[facility] AST, ALT, FLP ordered.

## 2021-10-21 NOTE — Telephone Encounter (Signed)
Pt called wondering what changes she should make to her diet in order to keep her cholesterol low. Pt stated that she will be going to pick up the atorvastatin that was prescribed to her but she is wondering what she can eat to help on the dietary end.

## 2021-10-21 NOTE — Telephone Encounter (Signed)
Patient's son [name] [name] spoke with [name], she has uses medication previously to help quit tobacco. I sent a prescription, please let her know. Thank you

## 2021-10-22 NOTE — Telephone Encounter (Signed)
Patient advised that rx has been sent.  

## 2021-10-22 NOTE — Telephone Encounter (Signed)
Called and left voicemail for son to call office back in regards to request. Closing encounter since this is attempt 2 to contact patient. Nothing further needed

## 2021-10-23 ENCOUNTER — Other Ambulatory Visit: Payer: Self-pay | Admitting: Internal Medicine

## 2021-10-24 ENCOUNTER — Ambulatory Visit (HOSPITAL_BASED_OUTPATIENT_CLINIC_OR_DEPARTMENT_OTHER)
Admission: RE | Admit: 2021-10-24 | Discharge: 2021-10-24 | Disposition: A | Discharge status: Home / Self Care | Payer: 59 | Source: Ambulatory Visit | Attending: Internal Medicine | Admitting: Internal Medicine

## 2021-10-24 DIAGNOSIS — Z780 Asymptomatic menopausal state: Secondary | ICD-10-CM | POA: Diagnosis present

## 2021-12-04 ENCOUNTER — Other Ambulatory Visit: Payer: 59

## 2022-01-09 ENCOUNTER — Other Ambulatory Visit: Payer: Self-pay | Admitting: Internal Medicine

## 2022-02-14 ENCOUNTER — Ambulatory Visit: Payer: Medicare Other | Admitting: Nurse Practitioner

## 2022-02-20 ENCOUNTER — Encounter: Payer: Self-pay | Admitting: *Deleted

## 2022-03-20 ENCOUNTER — Ambulatory Visit: Payer: Medicare Other | Admitting: Family Medicine

## 2022-03-27 ENCOUNTER — Encounter: Payer: Self-pay | Admitting: Family Medicine

## 2022-03-27 ENCOUNTER — Ambulatory Visit (INDEPENDENT_AMBULATORY_CARE_PROVIDER_SITE_OTHER): Payer: 59 | Admitting: Family Medicine

## 2022-03-27 VITALS — BP 119/80 | HR 96 | Temp 98.1°F | Resp 20 | Ht 66.0 in | Wt 150.0 lb

## 2022-03-27 DIAGNOSIS — E785 Hyperlipidemia, unspecified: Secondary | ICD-10-CM | POA: Diagnosis not present

## 2022-03-27 DIAGNOSIS — Z720 Tobacco use: Secondary | ICD-10-CM

## 2022-03-27 DIAGNOSIS — Z7189 Other specified counseling: Secondary | ICD-10-CM

## 2022-03-27 DIAGNOSIS — J441 Chronic obstructive pulmonary disease with (acute) exacerbation: Secondary | ICD-10-CM | POA: Insufficient documentation

## 2022-03-27 DIAGNOSIS — J449 Chronic obstructive pulmonary disease, unspecified: Secondary | ICD-10-CM | POA: Insufficient documentation

## 2022-03-27 DIAGNOSIS — F418 Other specified anxiety disorders: Secondary | ICD-10-CM

## 2022-03-27 DIAGNOSIS — F339 Major depressive disorder, recurrent, unspecified: Secondary | ICD-10-CM | POA: Insufficient documentation
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
## 2022-04-07 ENCOUNTER — Telehealth: Payer: Self-pay | Admitting: Family Medicine

## 2022-04-07 NOTE — Telephone Encounter (Signed)
[place] to schedule their annual wellness visit. Appointment made for 04/21/2022.  Thank you,  Colletta Maryland,  [place] [facility] Direct Dial ??[phone]

## 2022-04-07 NOTE — Telephone Encounter (Signed)
Called patient to schedule Medicare Annual Wellness Visit (AWV). No voicemail available to leave a message.  Last date of AWV: due 03/07/2019 awvi per [facility]  Please schedule an appointment at any time with either Mickel Baas or [place], NHA's. .  If any questions, please contact me at [phone].  Thank you,  Colletta Maryland,  [place] [facility] Direct Dial ??[phone]

## 2022-04-11 ENCOUNTER — Ambulatory Visit (INDEPENDENT_AMBULATORY_CARE_PROVIDER_SITE_OTHER): Payer: 59 | Admitting: Family Medicine

## 2022-04-11 ENCOUNTER — Encounter: Payer: Self-pay | Admitting: Family Medicine

## 2022-04-11 VITALS — BP 112/72 | HR 82 | Temp 98.3°F | Ht 66.0 in | Wt 149.0 lb

## 2022-04-11 DIAGNOSIS — F339 Major depressive disorder, recurrent, unspecified: Secondary | ICD-10-CM

## 2022-04-11 DIAGNOSIS — Z7951 Long term (current) use of inhaled steroids: Secondary | ICD-10-CM

## 2022-04-11 DIAGNOSIS — F418 Other specified anxiety disorders: Secondary | ICD-10-CM

## 2022-04-11 DIAGNOSIS — J42 Unspecified chronic bronchitis: Secondary | ICD-10-CM

## 2022-04-11 DIAGNOSIS — Z79899 Other long term (current) drug therapy: Secondary | ICD-10-CM

## 2022-04-11 DIAGNOSIS — F17200 Nicotine dependence, unspecified, uncomplicated: Secondary | ICD-10-CM

## 2022-04-11 DIAGNOSIS — E785 Hyperlipidemia, unspecified: Secondary | ICD-10-CM

## 2022-04-11 MED ORDER — BUPROPION HCL 75 MG PO TABS: ORAL_TABLET | ORAL | 0 refills | Status: DC
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
## 2022-04-12 LAB — CMP14+EGFR
ALT: 17 IU/L (ref 0–32)
AST: 24 IU/L (ref 0–40)
Albumin/Globulin Ratio: 1.6 (ref 1.2–2.2)
Albumin: 4.1 g/dL (ref 3.9–4.9)
Alkaline Phosphatase: 90 IU/L (ref 44–121)
BUN/Creatinine Ratio: 20 (ref 12–28)
BUN: 15 mg/dL (ref 8–27)
Bilirubin Total: 0.8 mg/dL (ref 0.0–1.2)
CO2: 22 mmol/L (ref 20–29)
Calcium: 9.8 mg/dL (ref 8.7–10.3)
Chloride: 101 mmol/L (ref 96–106)
Creatinine, Ser: 0.75 mg/dL (ref 0.57–1.00)
Globulin, Total: 2.5 g/dL (ref 1.5–4.5)
Glucose: 85 mg/dL (ref 70–99)
Potassium: 4.6 mmol/L (ref 3.5–5.2)
Sodium: 140 mmol/L (ref 134–144)
Total Protein: 6.6 g/dL (ref 6.0–8.5)
eGFR: 86 mL/min/{1.73_m2} (ref >59)

## 2022-04-12 LAB — LIPID PANEL
Chol/HDL Ratio: 2.7 ratio (ref 0.0–4.4)
Cholesterol, Total: 186 mg/dL (ref 100–199)
HDL: 70 mg/dL (ref >39)
LDL Chol Calc (NIH): 104 mg/dL — ABNORMAL HIGH (ref 0–99)
Triglycerides: 63 mg/dL (ref 0–149)
VLDL Cholesterol Cal: 12 mg/dL (ref 5–40)

## 2022-04-12 LAB — CBC WITH DIFFERENTIAL/PLATELET
Basophils Absolute: 0 10*3/uL (ref 0.0–0.2)
Basos: 1 %
EOS (ABSOLUTE): 0.1 10*3/uL (ref 0.0–0.4)
Eos: 1 %
Hematocrit: 43.1 % (ref 34.0–46.6)
Hemoglobin: 14.5 g/dL (ref 11.1–15.9)
Immature Grans (Abs): 0 10*3/uL (ref 0.0–0.1)
Immature Granulocytes: 0 %
Lymphocytes Absolute: 1.1 10*3/uL (ref 0.7–3.1)
Lymphs: 15 %
MCH: 28.9 pg (ref 26.6–33.0)
MCHC: 33.6 g/dL (ref 31.5–35.7)
MCV: 86 fL (ref 79–97)
Monocytes Absolute: 0.4 10*3/uL (ref 0.1–0.9)
Monocytes: 6 %
Neutrophils Absolute: 5.8 10*3/uL (ref 1.4–7.0)
Neutrophils: 77 %
Platelets: 331 10*3/uL (ref 150–450)
RBC: 5.01 x10E6/uL (ref 3.77–5.28)
RDW: 13 % (ref 11.7–15.4)
WBC: 7.5 10*3/uL (ref 3.4–10.8)

## 2022-04-12 LAB — TSH: TSH: 1.52 u[IU]/mL (ref 0.450–4.500)

## 2022-04-12 LAB — VITAMIN D 25 HYDROXY (VIT D DEFICIENCY, FRACTURES): Vit D, 25-Hydroxy: 54.8 ng/mL (ref 30.0–100.0)

## 2022-04-15 ENCOUNTER — Encounter: Payer: Self-pay | Admitting: Family Medicine

## 2022-04-15 ENCOUNTER — Ambulatory Visit: Payer: 59 | Admitting: Internal Medicine

## 2022-04-21 ENCOUNTER — Telehealth: Payer: Self-pay | Admitting: Family Medicine

## 2022-04-21 ENCOUNTER — Ambulatory Visit: Payer: 59

## 2022-04-21 NOTE — Telephone Encounter (Signed)
[place] to schedule their annual wellness visit. Appointment made for 04/23/2022.  NHA out of the office - Lm with new appt information   Thank you,  Colletta Maryland,  [place] ??[phone]

## 2022-04-23 ENCOUNTER — Ambulatory Visit: Payer: 59

## 2022-05-06 ENCOUNTER — Telehealth: Payer: Self-pay | Admitting: Family Medicine

## 2022-05-06 NOTE — Telephone Encounter (Signed)
[place] to schedule their annual wellness visit. Appointment made for 05/14/2022.   Thank you,  Colletta Maryland,  [place] [facility] Direct Dial ??[phone]

## 2022-05-13 ENCOUNTER — Ambulatory Visit: Payer: Medicare Other | Admitting: Family Medicine

## 2022-05-14 ENCOUNTER — Ambulatory Visit: Payer: 59

## 2022-07-01 ENCOUNTER — Other Ambulatory Visit: Payer: Self-pay | Admitting: Primary Care

## 2022-07-04 ENCOUNTER — Telehealth: Payer: Self-pay | Admitting: Family Medicine

## 2022-07-04 NOTE — Telephone Encounter (Signed)
Medication is under reconciled meds, does not see pulmonologist anymore, next OV w/ you is 07/15/22 Please advise on urgent refill

## 2022-07-08 NOTE — Telephone Encounter (Signed)
Lmtcb using interpreting service.

## 2022-07-09 ENCOUNTER — Ambulatory Visit: Payer: 59 | Admitting: Family Medicine

## 2022-07-15 ENCOUNTER — Encounter: Payer: Self-pay | Admitting: Family Medicine

## 2022-07-15 ENCOUNTER — Ambulatory Visit (INDEPENDENT_AMBULATORY_CARE_PROVIDER_SITE_OTHER): Payer: 59 | Admitting: Family Medicine

## 2022-07-15 VITALS — BP 117/66 | HR 86 | Temp 98.3°F | Ht 66.0 in | Wt 154.0 lb

## 2022-07-15 DIAGNOSIS — J42 Unspecified chronic bronchitis: Secondary | ICD-10-CM | POA: Diagnosis not present

## 2022-07-15 DIAGNOSIS — F339 Major depressive disorder, recurrent, unspecified: Secondary | ICD-10-CM | POA: Diagnosis not present

## 2022-07-15 DIAGNOSIS — F418 Other specified anxiety disorders: Secondary | ICD-10-CM

## 2022-07-15 DIAGNOSIS — F17201 Nicotine dependence, unspecified, in remission: Secondary | ICD-10-CM | POA: Diagnosis not present

## 2022-07-15 NOTE — Progress Notes (Signed)
Acute Office Visit  Subjective:  Patient ID: [name] [name], female    DOB: 05-Apr-1950, 72 y.o.   MRN: [id number]  Chief Complaint  Patient presents with   Medical Management of Chronic Issues    3 month follow up   HPI Patient is in today for follow up of chronic conditions  Chronic bronchitis, unspecified chronic bronchitis type (HCC) She was using nebulizer in the morning and the evening. She was then using Breztri after. She was using Ventolin while going out. Is using ventolin once per day usually after lunch. Does not have any nighttimes symptoms. Denies shortness of breath at night. Able to walk 30 minutes without stopping due to DOE.   Depression, recurrent (HCC) Anxiety associated with depression States that she had many life stressors in the past few months, but she is feeling much better. Her life stressors are not fully resolved. She would like to exercise more. She is cleaning, taking care of her 1 year of grandchild. She is still frustrated over the situation with her sister in [place].  Would not like to start medication for depression.  Tobacco use disorder Quit smoking one month ago and started exercising. Is using a toothpick to help with the habit of smoking.   ROS As per HPI    Objective:  BP 117/66   Pulse 86   Temp 98.3 F (36.8 C)   Ht 5\' 6"  (1.676 m)   Wt 154 lb (69.9 kg)   SpO2 93%   BMI 24.86 kg/m   Physical Exam Constitutional:      General: She is awake. She is not in acute distress.    Appearance: Normal appearance. She is well-developed, well-groomed and normal weight.  Cardiovascular:     Rate and Rhythm: Normal rate and regular rhythm.     Pulses: Normal pulses.          Radial pulses are 2+ on the right side and 2+ on the left side.     Heart sounds: Heart sounds are distant.  Pulmonary:     Effort: Pulmonary effort is normal.     Breath sounds: Decreased air movement present.     Comments: Hyperresonance  Musculoskeletal:      Right lower leg: No edema.     Left lower leg: No edema.  Neurological:     General: No focal deficit present.     Mental Status: She is alert, oriented to person, place, and time and easily aroused.  Psychiatric:        Attention and Perception: Attention and perception normal.        Mood and Affect: Mood and affect normal.        Speech: Speech normal.        Behavior: Behavior is cooperative.        Thought Content: Thought content normal.        Cognition and Memory: Cognition and memory normal.        Judgment: Judgment normal.        07/15/2022    2:52 PM 04/11/2022    1:03 PM 03/27/2022    9:55 AM  Depression screen PHQ 2/9  Decreased Interest 0 0 2  Down, Depressed, Hopeless 0 0 2  PHQ - 2 Score 0 0 4  Altered sleeping 0 0 0  Tired, decreased energy 0 1 3  Change in appetite 0 0 2  Feeling bad or failure about yourself  0 0 0  Trouble concentrating 0 0  0  Moving slowly or fidgety/restless 0 0 0  Suicidal thoughts 0 0 0  PHQ-9 Score 0 1 9  Difficult doing work/chores Not difficult at all Not difficult at all Not difficult at all      07/15/2022    2:55 PM 04/11/2022    1:03 PM 03/27/2022    9:55 AM 02/15/2018    3:17 PM  GAD 7 : Generalized Anxiety Score  Nervous, Anxious, on Edge 0 0 1 2  Control/stop worrying 0 0 0 3  Worry too much - different things 0 0 1 3  Trouble relaxing 0 0 0 3  Restless 0 0 0 1  Easily annoyed or irritable 0 0 0 1  Afraid - awful might happen 0 0 0 1  Total GAD 7 Score 0 0 2 14  Anxiety Difficulty Not difficult at all Not difficult at all Somewhat difficult     Assessment & Plan:  1. Chronic bronchitis, unspecified chronic bronchitis type (HCC) Well controlled on current regimen. Discussed with patient that she should not take nebulizer and proair together consistently. Instructed her on proper use of Breztri (maintenance) and Proair as reliever and rescue.   2. Depression, recurrent (HCC) Well controlled on current regimen. Patient  denies SI. Declines referral to counseling.   3. Anxiety associated with depression Well controlled on current regimen. Patient denies SI. Declines referral to counseling.   4. Tobacco use disorder, severe, in early remission Praised patient on her tobacco cessation! Patient stopped smoking one month ago. Will support patient as needed in quitting tobacco.   The above assessment and management plan was discussed with the patient. The patient verbalized understanding of and has agreed to the management plan using shared-decision making. Patient is aware to call the clinic if they develop any new symptoms or if symptoms fail to improve or worsen. Patient is aware when to return to the clinic for a follow-up visit. Patient educated on when it is appropriate to go to the emergency department.   Return in about 3 months (around 10/15/2022) for Chronic Condition Follow up.  Neale Burly, DNP-FNP [facility] [address] [place], [place] [address] [phone]

## 2022-07-17 NOTE — Telephone Encounter (Signed)
Patient seen in clinic

## 2022-07-18 ENCOUNTER — Other Ambulatory Visit: Payer: Self-pay | Admitting: Primary Care

## 2022-07-21 ENCOUNTER — Telehealth: Payer: Self-pay | Admitting: Family Medicine

## 2022-07-21 NOTE — Telephone Encounter (Signed)
  Prescription Request  07/21/2022  Is this a "Controlled Substance" medicine?   Have you seen your PCP in the last 2 weeks? Yes, LOV 07/15/22 NOV 10/16/22  If YES, route message to pool  -  If NO, patient needs to be scheduled for appointment.  What is the name of the medication or equipment?  ipratropium-albuterol (DUONEB) 0.5-2.5 (3) MG/3ML SOLN   Have you contacted your pharmacy to request a refill? yes   Which pharmacy would you like this sent to?  [facility] [phone] - [place],  - [address]     Patient notified that their request is being sent to the clinical staff for review and that they should receive a response within 2 business days.

## 2022-07-23 NOTE — Telephone Encounter (Signed)
Historical med, request has also been sent to Pulmonologist, waiting to see if they refill before sending to PCP

## 2022-07-25 NOTE — Telephone Encounter (Signed)
Denied refill ipratropium-albuterol (Duoneb).  Per chart note by Dr. Sherene Sires on 09/09/2021:  Plan C = Crisis (instead of Plan B but only if Plan B stops working) - only use your albuterol (not ipatropium) nebulizer if you first try Plan B and it fails to help > ok to use the nebulizer up to every 4 hours but if start needing it regularly call for immediate appointment

## 2022-07-28 ENCOUNTER — Telehealth: Payer: Self-pay | Admitting: Internal Medicine

## 2022-07-28 MED ORDER — IPRATROPIUM-ALBUTEROL 0.5-2.5 (3) MG/3ML IN SOLN: RESPIRATORY_TRACT | 1 refills | Status: DC

## 2022-07-28 NOTE — Telephone Encounter (Signed)
Spoke to pt's son and informed him that I sent ipratropium-albuterol (DUONEB) 0.5-2.5 (3) MG/3ML SOLN to the [facility]. Pt's son verbalized understanding. Nothing further needed.

## 2022-07-28 NOTE — Telephone Encounter (Signed)
Duoneb is a historical med, I see that it was requested to be filled by Pulmonologist waited to see if they refilled or not, they have a note as to why they did not refill. LMOVM that this medication is managed by her Pulmonologist and for her to call their office regarding refilling this medication.

## 2022-07-28 NOTE — Telephone Encounter (Signed)
Pt son called in to get pt scheduled for follow up appt and to get a prescription sent in for her nebulizer solution  [facility] in [place]

## 2022-09-17 ENCOUNTER — Encounter: Payer: Self-pay | Admitting: Internal Medicine

## 2022-09-17 ENCOUNTER — Ambulatory Visit (INDEPENDENT_AMBULATORY_CARE_PROVIDER_SITE_OTHER): Payer: 59 | Admitting: Internal Medicine

## 2022-09-17 ENCOUNTER — Ambulatory Visit: Payer: 59

## 2022-09-17 VITALS — BP 116/68 | HR 80 | Temp 97.5°F | Ht 64.0 in | Wt 158.2 lb

## 2022-09-17 DIAGNOSIS — Z87891 Personal history of nicotine dependence: Secondary | ICD-10-CM | POA: Diagnosis not present

## 2022-09-17 DIAGNOSIS — J449 Chronic obstructive pulmonary disease, unspecified: Secondary | ICD-10-CM

## 2022-09-17 DIAGNOSIS — R0609 Other forms of dyspnea: Secondary | ICD-10-CM | POA: Insufficient documentation

## 2022-09-17 LAB — BASIC METABOLIC PANEL
BUN: 20 mg/dL (ref 6–23)
CO2: 27 mEq/L (ref 19–32)
Calcium: 10 mg/dL (ref 8.4–10.5)
Chloride: 103 mEq/L (ref 96–112)
Creatinine, Ser: 0.73 mg/dL (ref 0.40–1.20)
GFR: 83.07 mL/min (ref >60.00)
Glucose, Bld: 77 mg/dL (ref 70–99)
Potassium: 3.9 mEq/L (ref 3.5–5.1)
Sodium: 139 mEq/L (ref 135–145)

## 2022-09-17 LAB — CBC WITH DIFFERENTIAL/PLATELET
Basophils Absolute: 0.1 10*3/uL (ref 0.0–0.1)
Basophils Relative: 1 % (ref 0.0–3.0)
Eosinophils Absolute: 0.1 10*3/uL (ref 0.0–0.7)
Eosinophils Relative: 0.9 % (ref 0.0–5.0)
HCT: 41.9 % (ref 36.0–46.0)
Hemoglobin: 13.8 g/dL (ref 12.0–15.0)
Lymphocytes Relative: 19.3 % (ref 12.0–46.0)
Lymphs Abs: 1.3 10*3/uL (ref 0.7–4.0)
MCHC: 32.8 g/dL (ref 30.0–36.0)
MCV: 87.1 fl (ref 78.0–100.0)
Monocytes Absolute: 0.4 10*3/uL (ref 0.1–1.0)
Monocytes Relative: 6.1 % (ref 3.0–12.0)
Neutro Abs: 5 10*3/uL (ref 1.4–7.7)
Neutrophils Relative %: 72.7 % (ref 43.0–77.0)
Platelets: 277 10*3/uL (ref 150.0–400.0)
RBC: 4.81 Mil/uL (ref 3.87–5.11)
RDW: 13.6 % (ref 11.5–15.5)
WBC: 6.9 10*3/uL (ref 4.0–10.5)

## 2022-09-17 LAB — BRAIN NATRIURETIC PEPTIDE: Pro B Natriuretic peptide (BNP): 29 pg/mL (ref 0.0–100.0)

## 2022-09-17 LAB — TSH: TSH: 3.37 u[IU]/mL (ref 0.35–5.50)

## 2022-09-17 MED ORDER — FAMOTIDINE 20 MG PO TABS: ORAL_TABLET | ORAL | 11 refills | Status: DC

## 2022-09-17 MED ORDER — PANTOPRAZOLE SODIUM 40 MG PO TBEC: 40.0000 mg | DELAYED_RELEASE_TABLET | Freq: Every day | ORAL | 2 refills | Status: DC

## 2022-09-17 NOTE — Patient Instructions (Addendum)
Plan A = Automatic = Always=    Breztri Take 2 puffs first thing in am and then another 2 puffs about 12 hours later.    Work on inhaler technique:  relax and gently blow all the way out then take a nice smooth full deep breath back in, triggeringithe inhaler at same time you start breathing in.  Hold breath in for at least  5 seconds if you can. Blow out breztri thru nose. Rinse and gargle with water when done.  If mouth or throat bother you at all,  try brushing teeth/gums/tongue with arm and hammer toothpaste/ make a slurry and gargle and spit out.    Plan B = Backup (to supplement plan A, not to replace it) Only use your albuterol inhaler as a rescue medication to be used if you can't catch your breath by resting or doing a relaxed purse lip breathing pattern.  - The less you use it, the better it will work when you need it. - Ok to use the inhaler up to 2 puffs  every 4 hours if you must but call for appointment if use goes up over your usual need - Don't leave home without it !!  (think of it like the spare tire for your car)   Plan C = Crisis (instead of Plan B but only if Plan B stops working) - only use your albuterol nebulizer if you first try Plan B and it fails to help > ok to use the nebulizer up to every 4 hours but if start needing it regularly call for immediate appointment    Also  Ok to try albuterol 15 min before an activity (on alternating days)  that you know would usually make you short of breath and see if it makes any difference and if makes none then don't take albuterol after activity unless you can't catch your breath as this means it's the resting that helps, not the albuterol.  Pantoprazole (protonix) 40 mg   Take  30-60 min before first meal of the day and Pepcid (famotidine)  20 mg after supper until return to office - this is the best way to tell whether stomach acid is contributing to your problem.    GERD (REFLUX)  is an extremely common cause of respiratory  symptoms just like yours , many times with no obvious heartburn at all.    It can be treated with medication, but also with lifestyle changes including elevation of the head of your bed (ideally with 6 -8inch blocks under the headboard of your bed),  Smoking cessation, avoidance of late meals, excessive alcohol, and avoid fatty foods, chocolate, peppermint, colas, red wine, and acidic juices such as orange juice.  NO MINT OR MENTHOL PRODUCTS SO NO COUGH DROPS  USE SUGARLESS CANDY INSTEAD (Jolley ranchers or Stover's or Life Savers) or even ice chips will also do - the key is to swallow to prevent all throat clearing. NO OIL BASED VITAMINS - use powdered substitutes.  Avoid fish oil when coughing.    Please remember to go to the lab department   for your tests - we will call you with the results when they are available.     Please remember to go to the  x-ray department  for your tests - we will call you with the results when they are available    Please schedule a follow up visit in 3 months but call sooner if needed - bring all you medications/inhalers/solutions with you

## 2022-09-17 NOTE — Progress Notes (Unsigned)
Subjective:   Patient ID: [name] [name], female    DOB: [birth year],     MRN: [id number]    Brief patient profile:  72  yo latina MM/quit smoking 02/2021  female [employer] from [place] to 1997  With prev dx of copd around 2010 with doe on qid symbicort x 2013 but gradually worse then admit to [facility] with PTX.     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 Pt states that she has increased dyspnea with exertion. Pt c/o of occasional wet cough with yellow to clear mucus and chest pain with breathing. Pt denies any wheeze.   still way overusing saba at baseline mostly in neb form with doe x across the room and not using symbicort or pain meds correctly (see a/p) rec Plan A = Automatic = Symbiocrt 160 one twice daily and Incruse one click each am  Plan B = Backup - Only use your albuterol (Yellow/proventil) as a rescue medication Plan C = Crisis/ contingency - nebulizer, ok use with albuterol (or combination until you use it up)  every 4 hours if plan B is not working  For pain > tramadol 50 mg up to 2 every 4 hours  For nerves> alprazolam 0.5 mg up to one every 4 hours     02/21/2021  f/u ov/ re: GOLD 3 COPD quit smoking 02/2021  maint on breztri   Chief Complaint  Patient presents with   Follow-up    Had cold 3 wks.,feeling better, sob-better now  Dyspnea:  back to baseline p uri Cough: better  Sleeping: back in flat bed/ one pillow SABA use: rarely hfa/ neb twice  Rec Plan A = Automatic = Always=    Breztri Take 2 puffs first thing in am and then another 2 puffs about 12 hours later.   Plan B = Backup (to supplement plan A, not to replace it) Only use your  albuterol inhaler as a rescue medication  Plan C = Crisis (instead of Plan B but only if Plan B stops working) - only use your albuterol nebulizer if you first try Plan B and it fails to help  Keep up the good work avoiding smoking         09/09/2021  f/u ov/ re: GOLD 3   maint on breztri  and not smoking but still very poor insight into how to use her meds  Chief Complaint  Patient presents with   Follow-up    Increased cough with green sputum x 2 wks. She also c/o wheezing and increased SOB.  She is using her albuterol inhaler daily.    Dyspnea:  more doe than usual assoc with cough x 2 weeks Cough: green mucus x 2 weeks assoc with nasal congestion  Sleeping: flat bed one pillow  SABA use: hfa and  02: none  Covid status:  never vax  Rec F/u prn   05/09/21 np rcs Plan A : Continue Breztri 2 puffs in the morning and at bedtime Plan B: Use albuterol rescue inhaler 2 puffs every 4 hours as needed for breakthrough shortness of breath only Plan C: Use ipratropium albuterol nebulizer every 6 hours as needed for for shortness of breath or wheezing if rescue inhaler ineffective  09/17/2022  f/u ov/ re: GOLD 3   maint on breztri  plus maint neb/ off cigs x 3 m  Chief Complaint  Patient presents with   Follow-up    Patient wants to know about Cancer Screening. Pt having SOB more often with exertion. Pt states neb meds not working anymore.  Dyspnea:  treadmill x 30 min helps her chest tight sensation which tends to be more at rest   Cough: none  Sleeping: flat bed one pillow  SABA use: using neb not hfa  02: none      No obvious day to day or daytime variability or assoc excess/ purulent sputum or mucus plugs or hemoptysis or cp or  subjective wheeze or overt sinus or hb symptoms.     Also denies any obvious fluctuation of symptoms with weather or environmental changes or other aggravating or alleviating factors except as outlined above   No unusual exposure hx or h/o  childhood pna/ asthma or knowledge of premature birth.  Current Allergies, Complete Past Medical History, Past Surgical History, Family History, and Social History were reviewed in [employer] record.  ROS  The following are not active complaints unless bolded Hoarseness, sore throat, dysphagia, dental problems, itching, sneezing,  nasal congestion or discharge of excess mucus or purulent secretions, ear ache,   fever, chills, sweats, unintended wt loss or wt gain, classically pleuritic or exertional cp,  orthopnea pnd or arm/hand swelling  or leg swelling, presyncope, palpitations, abdominal pain, anorexia, nausea, vomiting, diarrhea  or change in bowel habits or change in bladder habits, change in stools or change in urine, dysuria, hematuria,  rash, arthralgias, visual complaints, headache, numbness, weakness or ataxia or problems with walking or coordination,  change in mood/ anxious  or  memory.        Current Meds  Medication Sig   acetaminophen (TYLENOL) 500 MG tablet Take 1 tablet (500 mg total) by mouth every 6 (six) hours as needed.   albuterol (PROAIR HFA) 108 (90 Base) MCG/ACT inhaler 2 puffs every 4 hours as needed only  if your can't catch your breath   albuterol (PROVENTIL) (2.5 MG/3ML) 0.083% nebulizer solution Take 3 mLs (2.5 mg total) by nebulization every 4 (four) hours as needed for wheezing or shortness of breath.   atorvastatin (LIPITOR) 20 MG tablet Take 1 tablet (20 mg total) by mouth at bedtime.   Budeson-Glycopyrrol-Formoterol (BREZTRI AEROSPHERE) 160-9-4.8 MCG/ACT AERO Inhale 2 puffs into the lungs in the morning and at bedtime.   buPROPion (WELLBUTRIN) 75 MG tablet 1 tablet daily x10 days, then 1 tablet twice daily   Calcium Carbonate (CALCIUM 600 PO) Take by mouth.   carboxymethylcellulose (REFRESH PLUS) 0.5 % SOLN 1 drop 3 (three) times daily as needed.   cetirizine (ZYRTEC) 10 MG tablet Take 1 tablet (10 mg total) by mouth daily. (Patient taking  differently: Take 10 mg by mouth daily as needed for allergies.)   Ergocalciferol 50 MCG (2000 UT) CAPS Take by mouth.   ibuprofen (ADVIL,MOTRIN) 200 MG tablet Take 400 mg by mouth every 6 (six) hours as needed for headache or moderate pain.   ipratropium-albuterol (DUONEB) 0.5-2.5 (3) MG/3ML SOLN SMARTSIG:3 Milliliter(s) Via Nebulizer Every 6 Hours PRN   Multiple Vitamin (MULTIVITAMIN WITH MINERALS) TABS tablet Take 1 tablet by mouth daily.           Objective:   Physical Exam  Wts  09/17/2022        158  09/09/2021  166  02/21/2021        168   06/12/2020       179 03/22/2020        176 06/27/2019        184   12/24/2018    180  11/24/2014      177 > 01/31/2015 180 >  08/29/2016    190     11/10/14 177 lb 3.2 oz (80.377 kg)  11/08/14 178 lb 5 oz (80.882 kg)  10/26/14 177 lb 1.6 oz (80.332 kg)    Vital signs reviewed  09/17/2022  - Note at rest 02 sats  99% on RA   General appearance:    amb anxious latina nad  HEENT : Oropharynx  clear       NECK :  without  apparent JVD/ palpable Nodes/TM    LUNGS: no acc muscle use,  Mild barrel  contour chest wall with bilateral  Distant bs s audible wheeze and  without cough on insp or exp maneuvers  and mild  Hyperresonant  to  percussion bilaterally     CV:  RRR  no s3 or murmur or increase in P2, and no edema   ABD:  soft and nontender with pos end  insp Hoover's  in the supine position.  No bruits or organomegaly appreciated   MS:  Nl gait/ ext warm without deformities Or obvious joint restrictions  calf tenderness, cyanosis or clubbing     SKIN: warm and dry without lesions    NEURO:  alert, approp, nl sensorium with  no motor or cerebellar deficits apparent.         Assessment & Plan:

## 2022-09-18 NOTE — Assessment & Plan Note (Signed)
Stopped smoking 06/2022   Low-dose CT lung cancer screening is recommended for patients who are 52-71 years of age with a 20+ pack-year history of smoking and who are currently smoking or quit <=15 years ago. No coughing up blood  No unintentional weight loss of > 15 pounds in the last 6 months - pt is eligible for scanning yearly until age 69 > referred for shared decision making  F/u in 3 m with all meds in hand using a trust but verify approach to confirm accurate Medication  Reconciliation The principal here is that until we are certain that the  patients are doing what we've asked, it makes no sense to ask them to do more.    Each maintenance medication was reviewed in detail including emphasizing most importantly the difference between maintenance and prns and under what circumstances the prns are to be triggered using an action plan format where appropriate.  Total time for H and P, chart review, counseling, reviewing hfa/neb device(s) , directly observing portions of ambulatory 02 saturation study/ and generating customized AVS unique to this office visit / same day charting > 40 min for  refractory respiratory  symptoms of uncertain etiology

## 2022-09-18 NOTE — Assessment & Plan Note (Signed)
Onset since stopped smoking 06/2022  - 09/17/2022   Walked on RA  x  3  lap(s) =  approx 750  ft  @ moderate  pace, stopped due to end of study with lowest 02 sats 91% s sob or cp  Symptoms are markedly disproportionate to objective findings and not clear to what extent this is actually a pulmonary  problem but pt does appear to have difficult to sort out respiratory symptoms of unknown origin for which  DDX  = almost all start with A and  include Adherence, Ace Inhibitors, Acid Reflux, Active Sinus Disease, Alpha 1 Antitripsin deficiency, Anxiety masquerading as Airways dz,  ABPA,  Allergy(esp in young), Aspiration (esp in elderly), Adverse effects of meds,  Active smoking or Vaping, A bunch of PE's/clot burden (a few small clots can't cause this syndrome unless there is already severe underlying pulm or vascular dz with poor reserve),  Anemia or thyroid disorder, plus two Bs  = Bronchiectasis and Beta blocker use..and one C= CHF     Main concerns are bolded and eval by lab with nl Eos noted to support allergy and ABPA and also No evidence of chf/ IHD/ dvt or PE/ anemia or thyroid dz by today's labs  This leaves anxiety at the top of the list of suspects > deferred to PCP    HC03 also wnl

## 2022-09-18 NOTE — Assessment & Plan Note (Signed)
Quit smoking 06/2022 / MM  - 11/10/2014  new maint rx = symb 160/incruse sample - Alpha one AT 11/24/2014 >  MM/ nl levels - 01/31/2015  extensive coaching HFA effectiveness =    90%  - spirometry 01/31/2015  FEV1  0.85 (37%) ratio 34  - Spirometry 08/29/2016  FEV1 0.71 (30%)  Ratio 36  > added back LAMA = incruse samples  - 06/12/2020    continue breztri  - 02/21/2021  After extensive coaching inhaler device,  effectiveness =    90%  - 02/21/2021 added pred x 6 days as Plan C on action plan> not using as of 09/09/2021  - 09/17/2022  After extensive coaching inhaler device,  effectiveness =    80% > continue breztri with more approp saba: (over using saba neb)  Re SABA :  I spent extra time with pt today reviewing appropriate use of albuterol for prn use on exertion with the following points: 1) saba is for relief of sob that does not improve by walking a slower pace or resting but rather if the pt does not improve after trying this first. 2) If the pt is convinced, as many are, that saba helps recover from activity faster then it's easy to tell if this is the case by re-challenging : ie stop, take the inhaler, then p 5 minutes try the exact same activity (intensity of workload) that just caused the symptoms and see if they are substantially diminished or not after saba 3) if there is an activity that reproducibly causes the symptoms, try the saba 15 min before the activity on alternate days   If in fact the saba really does help, then fine to continue to use it prn but advised may need to look closer at the maintenance regimen being used to achieve better control of airways disease with exertion.

## 2022-09-26 ENCOUNTER — Telehealth: Payer: Self-pay | Admitting: Internal Medicine

## 2022-09-26 NOTE — Telephone Encounter (Signed)
Called patients son back and gave cxr information.  This is documented under result notes. Nothing further needed at this time.

## 2022-09-26 NOTE — Telephone Encounter (Signed)
Patient's son is calling back regarding a missed phone call he received. He thinks the call was in regards to his mother's test results.

## 2022-09-30 ENCOUNTER — Telehealth: Payer: Self-pay | Admitting: Internal Medicine

## 2022-09-30 NOTE — Telephone Encounter (Signed)
PT's son calling for his Moms Breztri refill. Pharm has sent requests.    Pharm is [facility] on [address].   Please call to advise: [phone] is the sons #

## 2022-10-01 MED ORDER — BREZTRI AEROSPHERE 160-9-4.8 MCG/ACT IN AERO: 2.0000 | INHALATION_SPRAY | Freq: Two times a day (BID) | RESPIRATORY_TRACT | 11 refills | Status: DC

## 2022-10-01 NOTE — Telephone Encounter (Signed)
ATC X1 LVM for patients son advising Breztri inhaler has been sent to pharmacy

## 2022-10-02 NOTE — Telephone Encounter (Signed)
Left message on VM (patient's son) notifying Markus Daft has been sent to patients pharmacy.  Will close encounter.

## 2022-10-16 ENCOUNTER — Other Ambulatory Visit: Payer: Self-pay | Admitting: Family Medicine

## 2022-10-16 ENCOUNTER — Ambulatory Visit: Payer: 59

## 2022-10-16 ENCOUNTER — Ambulatory Visit (INDEPENDENT_AMBULATORY_CARE_PROVIDER_SITE_OTHER): Payer: 59 | Admitting: Family Medicine

## 2022-10-16 ENCOUNTER — Encounter: Payer: Self-pay | Admitting: Family Medicine

## 2022-10-16 VITALS — BP 118/67 | HR 83 | Temp 98.3°F | Ht 64.0 in | Wt 160.0 lb

## 2022-10-16 DIAGNOSIS — F17201 Nicotine dependence, unspecified, in remission: Secondary | ICD-10-CM | POA: Diagnosis not present

## 2022-10-16 DIAGNOSIS — J029 Acute pharyngitis, unspecified: Secondary | ICD-10-CM

## 2022-10-16 DIAGNOSIS — Z780 Asymptomatic menopausal state: Secondary | ICD-10-CM

## 2022-10-16 LAB — CULTURE, GROUP A STREP

## 2022-10-16 LAB — RAPID STREP SCREEN (MED CTR MEBANE ONLY): Strep Gp A Ag, IA W/Reflex: NEGATIVE

## 2022-10-16 MED ORDER — AMOXICILLIN 500 MG PO CAPS
500.0000 mg | ORAL_CAPSULE | Freq: Two times a day (BID) | ORAL | 0 refills | Status: AC
Start: 2022-10-16 — End: 2022-10-26

## 2022-10-16 NOTE — Progress Notes (Signed)
Subjective:  Patient ID: [name] [name], female    DOB: September 19, 1950, 72 y.o.   MRN: [id number]  Patient Care Team: Arrie Senate, FNP as PCP - General (Family Medicine) Nyoka Cowden, MD as Consulting Physician (Pulmonary Disease) Rinaldo Ratel, MD as Referring Physician (Gastroenterology)   Chief Complaint:  Medical Management of Chronic Issues (Acute -  sore throat and sinuses feel 'yucky'/X2 weeks)  HPI: [name] [name] is a 72 y.o. female presenting on 10/16/2022 for Medical Management of Chronic Issues (Acute -  sore throat and sinuses feel 'yucky'/X2 weeks)  HPI States that 2 weeks ago with a sore throat. She is trying Listerine, which is causing more burning to her throat.  States that she has runny nose and sinus pressure. Denies cough, ear pressure, fever.  Is using vicks vapor rub.  Believes that stress is contributing to her sick.  States that she is walking some and is able to breathe well.  Has not been using albuterol.  Endorses some diarrhea. She believes it is due to the food she ate yesterday.   Tobacco Quit smoking in May.  She is still stressed about the political turmoil in her coffee.   Relevant past medical, surgical, family, and social history reviewed and updated as indicated.  Allergies and medications reviewed and updated. Data reviewed: Chart in Epic.  Past Medical History:  Diagnosis Date   Anxiety    Complication of anesthesia    COPD (chronic obstructive pulmonary disease) (HCC)    COVID-19    X2   Diverticulitis    Dyspnea    Migraines    Past Surgical History:  Procedure Laterality Date   CATARACT EXTRACTION Left    CESAREAN SECTION     STAPLING OF BLEBS Left 10/31/2014   Procedure: STAPLING OF BLEBS;  Surgeon: Alleen Borne, MD;  Location: [facility] OR;  Service: Thoracic;  Laterality: Left;   VIDEO ASSISTED THORACOSCOPY Left 10/31/2014   Procedure: LEFT VIDEO ASSISTED THORACOSCOPY;  Surgeon: Alleen Borne, MD;  Location: [facility]  OR;  Service: Thoracic;  Laterality: Left;   Social History   Socioeconomic History   Marital status: Divorced    Spouse name: Not on file   Number of children: 1   Years of education: Some College   Highest education level: Not on file  Occupational History   Occupation: not working currently  Tobacco Use   Smoking status: Former    Current packs/day: 0.00    Types: Cigarettes    Quit date: 08/03/2021    Years since quitting: 1.2   Smokeless tobacco: Never   Tobacco comments:    started age in her late teens; +/- 1 ppd    Smokes on-off, very little   Vaping Use   Vaping status: Never Used  Substance and Sexual Activity   Alcohol use: No    Alcohol/week: 0.0 standard drinks of alcohol   Drug use: No   Sexual activity: Not Currently  Other Topics Concern   Not on file  Social History Narrative   Lives by self    Son, [name] [name]   Caffeine use: Coffee- 4-5 cups per day   From [place] , father from [place]   Living in [place] since the 90s       Social Determinants of Health   Financial Resource Strain: Not on file  Food Insecurity: No Food Insecurity (03/18/2021)   Received [facility], [facility]   Hunger Vital Sign  Worried About Programme researcher, broadcasting/film/video in the Last Year: Never true    Ran Out of Food in the Last Year: Never true  Transportation Needs: Not on file  Physical Activity: Not on file  Stress: Not on file  Social Connections: Unknown (06/17/2021)   Received from [facility], [facility]   Social Network    Social Network: Not on file  Intimate Partner Violence: Unknown (05/09/2021)   Received from [facility], [facility]   HITS    Physically Hurt: Not on file    Insult or Talk Down To: Not on file    Threaten Physical Harm: Not on file    Scream or Curse: Not on file   Outpatient Encounter Medications as of 10/16/2022  Medication Sig   acetaminophen (TYLENOL) 500 MG tablet Take 1 tablet (500 mg total) by mouth every 6 (six) hours as needed.    albuterol (PROAIR HFA) 108 (90 Base) MCG/ACT inhaler 2 puffs every 4 hours as needed only  if your can't catch your breath   albuterol (PROVENTIL) (2.5 MG/3ML) 0.083% nebulizer solution Take 3 mLs (2.5 mg total) by nebulization every 4 (four) hours as needed for wheezing or shortness of breath.   atorvastatin (LIPITOR) 20 MG tablet Take 1 tablet (20 mg total) by mouth at bedtime.   Budeson-Glycopyrrol-Formoterol (BREZTRI AEROSPHERE) 160-9-4.8 MCG/ACT AERO Inhale 2 puffs into the lungs in the morning and at bedtime.   buPROPion (WELLBUTRIN) 75 MG tablet 1 tablet daily x10 days, then 1 tablet twice daily   Calcium Carbonate (CALCIUM 600 PO) Take by mouth.   carboxymethylcellulose (REFRESH PLUS) 0.5 % SOLN 1 drop 3 (three) times daily as needed.   cetirizine (ZYRTEC) 10 MG tablet Take 1 tablet (10 mg total) by mouth daily. (Patient taking differently: Take 10 mg by mouth daily as needed for allergies.)   Ergocalciferol 50 MCG (2000 UT) CAPS Take by mouth.   famotidine (PEPCID) 20 MG tablet One after supper   ibuprofen (ADVIL,MOTRIN) 200 MG tablet Take 400 mg by mouth every 6 (six) hours as needed for headache or moderate pain.   ipratropium-albuterol (DUONEB) 0.5-2.5 (3) MG/3ML SOLN SMARTSIG:3 Milliliter(s) Via Nebulizer Every 6 Hours PRN   Multiple Vitamin (MULTIVITAMIN WITH MINERALS) TABS tablet Take 1 tablet by mouth daily.   pantoprazole (PROTONIX) 40 MG tablet Take 1 tablet (40 mg total) by mouth daily. Take 30-60 min before first meal of the day   No facility-administered encounter medications on file as of 10/16/2022.   No Known Allergies  Review of Systems As per HPI  Objective:  BP 118/67   Pulse 83   Temp 98.3 F (36.8 C)   Ht 5\' 4"  (1.626 m)   Wt 160 lb (72.6 kg)   SpO2 95%   BMI 27.46 kg/m    Wt Readings from Last 3 Encounters:  10/16/22 160 lb (72.6 kg)  09/17/22 158 lb 3.2 oz (71.8 kg)  07/15/22 154 lb (69.9 kg)   Physical Exam Constitutional:      General: She is  awake. She is not in acute distress.    Appearance: Normal appearance. She is well-developed and well-groomed. She is not ill-appearing, toxic-appearing or diaphoretic.  HENT:     Right Ear: Tympanic membrane is erythematous.     Left Ear: Tympanic membrane is erythematous.     Nose:     Right Sinus: Maxillary sinus tenderness present. No frontal sinus tenderness.     Left Sinus: Maxillary sinus tenderness present. No frontal  sinus tenderness.     Mouth/Throat:     Lips: Pink. No lesions.     Mouth: Mucous membranes are moist.     Tongue: No lesions. Tongue does not deviate from midline.     Palate: No mass and lesions.     Pharynx: Oropharyngeal exudate and posterior oropharyngeal erythema present.     Tonsils: Tonsillar exudate present. 3+ on the right. 3+ on the left.      Comments: Exudative patches on left tonsil  Cardiovascular:     Rate and Rhythm: Normal rate and regular rhythm.     Pulses: Normal pulses.          Radial pulses are 2+ on the right side and 2+ on the left side.       Posterior tibial pulses are 2+ on the right side and 2+ on the left side.     Heart sounds: Normal heart sounds. No murmur heard.    No gallop.  Pulmonary:     Effort: Pulmonary effort is normal. No respiratory distress.     Breath sounds: Normal breath sounds. No stridor. No wheezing, rhonchi or rales.  Musculoskeletal:     Cervical back: Full passive range of motion without pain and neck supple.     Right lower leg: No edema.     Left lower leg: No edema.  Skin:    General: Skin is warm.     Capillary Refill: Capillary refill takes less than 2 seconds.  Neurological:     General: No focal deficit present.     Mental Status: She is alert, oriented to person, place, and time and easily aroused. Mental status is at baseline.     GCS: GCS eye subscore is 4. GCS verbal subscore is 5. GCS motor subscore is 6.     Motor: No weakness.  Psychiatric:        Attention and Perception: Attention and  perception normal.        Mood and Affect: Mood and affect normal.        Speech: Speech normal.        Behavior: Behavior normal. Behavior is cooperative.        Thought Content: Thought content normal. Thought content does not include homicidal or suicidal ideation. Thought content does not include homicidal or suicidal plan.        Cognition and Memory: Cognition and memory normal.        Judgment: Judgment normal.    Results for orders placed or performed in visit on 09/17/22  TSH  Result Value Ref Range   TSH 3.37 0.35 - 5.50 uIU/mL  CBC with Differential/Platelet  Result Value Ref Range   WBC 6.9 4.0 - 10.5 K/uL   RBC 4.81 3.87 - 5.11 Mil/uL   Hemoglobin 13.8 12.0 - 15.0 g/dL   HCT 16.1 09.6 - 04.5 %   MCV 87.1 78.0 - 100.0 fl   MCHC 32.8 30.0 - 36.0 g/dL   RDW 40.9 81.1 - 91.4 %   Platelets 277.0 150.0 - 400.0 K/uL   Neutrophils Relative % 72.7 43.0 - 77.0 %   Lymphocytes Relative 19.3 12.0 - 46.0 %   Monocytes Relative 6.1 3.0 - 12.0 %   Eosinophils Relative 0.9 0.0 - 5.0 %   Basophils Relative 1.0 0.0 - 3.0 %   Neutro Abs 5.0 1.4 - 7.7 K/uL   Lymphs Abs 1.3 0.7 - 4.0 K/uL   Monocytes Absolute 0.4 0.1 - 1.0 K/uL  Eosinophils Absolute 0.1 0.0 - 0.7 K/uL   Basophils Absolute 0.1 0.0 - 0.1 K/uL  Basic metabolic panel  Result Value Ref Range   Sodium 139 135 - 145 mEq/L   Potassium 3.9 3.5 - 5.1 mEq/L   Chloride 103 96 - 112 mEq/L   CO2 27 19 - 32 mEq/L   Glucose, Bld 77 70 - 99 mg/dL   BUN 20 6 - 23 mg/dL   Creatinine, Ser 1.47 0.40 - 1.20 mg/dL   GFR 82.95 >62.13 mL/min   Calcium 10.0 8.4 - 10.5 mg/dL  Brain natriuretic peptide  Result Value Ref Range   Pro B Natriuretic peptide (BNP) 29.0 0.0 - 100.0 pg/mL       10/16/2022    2:43 PM 07/15/2022    2:52 PM 04/11/2022    1:03 PM 03/27/2022    9:55 AM 10/15/2021    1:34 PM  Depression screen PHQ 2/9  Decreased Interest 0 0 0 2 0  Down, Depressed, Hopeless 0 0 0 2 0  PHQ - 2 Score 0 0 0 4 0  Altered sleeping  0 0 0 0 0  Tired, decreased energy 0 0 1 3 0  Change in appetite 0 0 0 2 0  Feeling bad or failure about yourself  0 0 0 0 0  Trouble concentrating 0 0 0 0 0  Moving slowly or fidgety/restless 0 0 0 0 0  Suicidal thoughts 0 0 0 0 0  PHQ-9 Score 0 0 1 9 0  Difficult doing work/chores Not difficult at all Not difficult at all Not difficult at all Not difficult at all        10/16/2022    2:43 PM 07/15/2022    2:55 PM 04/11/2022    1:03 PM 03/27/2022    9:55 AM  GAD 7 : Generalized Anxiety Score  Nervous, Anxious, on Edge 0 0 0 1  Control/stop worrying 0 0 0 0  Worry too much - different things 0 0 0 1  Trouble relaxing 0 0 0 0  Restless 0 0 0 0  Easily annoyed or irritable 0 0 0 0  Afraid - awful might happen 0 0 0 0  Total GAD 7 Score 0 0 0 2  Anxiety Difficulty Not difficult at all Not difficult at all Not difficult at all Somewhat difficult   Pertinent labs & imaging results that were available during my care of the patient were reviewed by me and considered in my medical decision making.  Assessment & Plan:  Seferina was seen today for medical management of chronic issues.  Diagnoses and all orders for this visit:  Sore throat Labs as below. Will communicate results to patient once available. Will await results to determine next steps.  Discussed at home care with patient and return precautions.  -     Culture, Group A Strep; Future -     Rapid Strep Screen ([facility] ONLY); Future -     Rapid Strep Screen ([facility] ONLY) -     Culture, Group A Strep -     amoxicillin (AMOXIL) 500 MG capsule; Take 1 capsule (500 mg total) by mouth 2 (two) times daily for 10 days.  Tobacco use disorder, severe, in early remission Praised patient for abstaining from tobacco   Continue all other maintenance medications.  Follow up plan: Return in about 3 months (around 01/15/2023) for Chronic Condition Follow up.  Continue healthy lifestyle choices, including diet (rich in  fruits, vegetables, and lean proteins, and low in salt and simple carbohydrates) and exercise (at least 30 minutes of moderate physical activity daily).  Written and verbal instructions provided   The above assessment and management plan was discussed with the patient. The patient verbalized understanding of and has agreed to the management plan. Patient is aware to call the clinic if they develop any new symptoms or if symptoms persist or worsen. Patient is aware when to return to the clinic for a follow-up visit. Patient educated on when it is appropriate to go to the emergency department.   Neale Burly, DNP-FNP [facility] [address] [place], [place] [address] [phone]

## 2022-10-19 LAB — CULTURE, GROUP A STREP: Strep A Culture: NEGATIVE

## 2022-10-20 NOTE — Progress Notes (Signed)
Negative for strep. Recommend continuing supportive treatment, follow up if symptoms continue

## 2022-10-30 ENCOUNTER — Other Ambulatory Visit: Payer: 59

## 2022-10-30 ENCOUNTER — Ambulatory Visit: Payer: 59 | Admitting: Family Medicine

## 2022-10-30 ENCOUNTER — Encounter: Payer: Self-pay | Admitting: Family Medicine

## 2022-10-30 DIAGNOSIS — E7800 Pure hypercholesterolemia, unspecified: Secondary | ICD-10-CM | POA: Insufficient documentation

## 2022-11-21 ENCOUNTER — Other Ambulatory Visit: Payer: Self-pay | Admitting: Internal Medicine

## 2022-12-02 ENCOUNTER — Other Ambulatory Visit: Payer: Self-pay | Admitting: Internal Medicine

## 2022-12-02 DIAGNOSIS — J449 Chronic obstructive pulmonary disease, unspecified: Secondary | ICD-10-CM

## 2022-12-03 ENCOUNTER — Telehealth: Payer: Self-pay | Admitting: Internal Medicine

## 2022-12-03 NOTE — Telephone Encounter (Signed)
Called and spoke with pharmacy staff at [facility] in [place].  It was verified that she has 10 refills on her Breztri left.  They will need to call the pharmacy for a refill.  Left VM to return call.

## 2022-12-04 NOTE — Telephone Encounter (Signed)
Called pt no answer, lvmm

## 2022-12-24 ENCOUNTER — Other Ambulatory Visit: Payer: Self-pay | Admitting: Internal Medicine

## 2022-12-26 ENCOUNTER — Emergency Department (HOSPITAL_COMMUNITY)
Admission: EM | Admit: 2022-12-26 | Discharge: 2022-12-26 | Disposition: A | Discharge status: Home / Self Care | Payer: 59 | Attending: Student | Admitting: Student

## 2022-12-26 ENCOUNTER — Emergency Department (HOSPITAL_COMMUNITY): Payer: 59

## 2022-12-26 ENCOUNTER — Other Ambulatory Visit: Payer: Self-pay

## 2022-12-26 DIAGNOSIS — R0602 Shortness of breath: Secondary | ICD-10-CM | POA: Diagnosis present

## 2022-12-26 DIAGNOSIS — Z20822 Contact with and (suspected) exposure to covid-19: Secondary | ICD-10-CM | POA: Diagnosis not present

## 2022-12-26 DIAGNOSIS — Z7951 Long term (current) use of inhaled steroids: Secondary | ICD-10-CM | POA: Diagnosis not present

## 2022-12-26 DIAGNOSIS — J441 Chronic obstructive pulmonary disease with (acute) exacerbation: Secondary | ICD-10-CM | POA: Diagnosis not present

## 2022-12-26 LAB — BASIC METABOLIC PANEL
Anion gap: 10 (ref 5–15)
BUN: 17 mg/dL (ref 8–23)
CO2: 20 mmol/L — ABNORMAL LOW (ref 22–32)
Calcium: 8.8 mg/dL — ABNORMAL LOW (ref 8.9–10.3)
Chloride: 106 mmol/L (ref 98–111)
Creatinine, Ser: 0.65 mg/dL (ref 0.44–1.00)
GFR, Estimated: >60 mL/min (ref >60)
Glucose, Bld: 98 mg/dL (ref 70–99)
Potassium: 3.5 mmol/L (ref 3.5–5.1)
Sodium: 136 mmol/L (ref 135–145)

## 2022-12-26 LAB — CBC
HCT: 42 % (ref 36.0–46.0)
Hemoglobin: 13.6 g/dL (ref 12.0–15.0)
MCH: 28.4 pg (ref 26.0–34.0)
MCHC: 32.4 g/dL (ref 30.0–36.0)
MCV: 87.7 fL (ref 80.0–100.0)
Platelets: 210 10*3/uL (ref 150–400)
RBC: 4.79 MIL/uL (ref 3.87–5.11)
RDW: 13.1 % (ref 11.5–15.5)
WBC: 6.6 10*3/uL (ref 4.0–10.5)
nRBC: 0 % (ref 0.0–0.2)

## 2022-12-26 LAB — RESP PANEL BY RT-PCR (RSV, FLU A&B, COVID)  RVPGX2
Influenza A by PCR: NEGATIVE
Influenza B by PCR: NEGATIVE
Resp Syncytial Virus by PCR: NEGATIVE
SARS Coronavirus 2 by RT PCR: NEGATIVE

## 2022-12-26 MED ORDER — IPRATROPIUM-ALBUTEROL 0.5-2.5 (3) MG/3ML IN SOLN
3.0000 mL | RESPIRATORY_TRACT | Status: AC
  Administered 2022-12-26: 3 mL via RESPIRATORY_TRACT
  Filled 2022-12-26: qty 3

## 2022-12-26 MED ORDER — AZITHROMYCIN 250 MG PO TABS: 250.0000 mg | ORAL_TABLET | Freq: Every day | ORAL | 0 refills | Status: AC

## 2022-12-26 MED ORDER — SODIUM CHLORIDE 0.9 % IV BOLUS
1000.0000 mL | Freq: Once | INTRAVENOUS | Status: AC
Start: 2022-12-26 — End: 2022-12-26
  Administered 2022-12-26: 1000 mL via INTRAVENOUS

## 2022-12-26 MED ORDER — AZITHROMYCIN 250 MG PO TABS
500.0000 mg | ORAL_TABLET | Freq: Once | ORAL | Status: AC
Start: 2022-12-26 — End: 2022-12-26
  Administered 2022-12-26: 500 mg via ORAL
  Filled 2022-12-26: qty 2

## 2022-12-26 MED ORDER — MAGNESIUM SULFATE 2 GM/50ML IV SOLN
2.0000 g | Freq: Once | INTRAVENOUS | Status: AC
  Administered 2022-12-26: 2 g via INTRAVENOUS
  Filled 2022-12-26: qty 50

## 2022-12-26 MED ORDER — SODIUM CHLORIDE 0.9 % IV SOLN
1.0000 g | Freq: Once | INTRAVENOUS | Status: AC
  Administered 2022-12-26: 1 g via INTRAVENOUS
  Filled 2022-12-26: qty 10

## 2022-12-26 MED ORDER — PREDNISONE 50 MG PO TABS: 50.0000 mg | ORAL_TABLET | Freq: Every day | ORAL | 0 refills | Status: AC

## 2022-12-26 NOTE — ED Triage Notes (Signed)
Patient BIB EMS from home with c/o COPD. Wheezing upper right lobes. Resp 40, x1 neb tx at home without relief. EMS gave duo neb 15mg /1mg , Soul Medrol . 158/94, 113, 22g left hand.

## 2022-12-26 NOTE — Discharge Instructions (Addendum)
You were seen here today for shortness of breath.  Your workup showed reassuring electrolytes, cell counts.  Your COVID/flu test is pending at discharge.  Please follow-up the result of this on MyChart.  Please use prednisone, azithromycin as prescribed and use albuterol as needed.  Please follow-up with your PCP.  Please return to the emergency department for chest pain, shortness of breath, severe vomiting or inability to keep down food, loss of consciousness, or any worsening symptom or concern.  Lo atendieron aqu hoy por dificultad para respirar.  Su anlisis mostr electrolitos tranquilizadores y recuentos de clulas.  Su prueba de COVID/gripe est pendiente al momento del alta.  Haga un seguimiento del resultado de Licensed conveyancer.  Utilice prednisona, azitromicina segn lo prescrito y utilice albuterol segn sea necesario.  Haga un seguimiento con su PCP.  Regrese al departamento de emergencias si tiene dolor en el pecho, dificultad para respirar, vmitos intensos o incapacidad para retener los alimentos, prdida del conocimiento o cualquier sntoma o preocupacin que empeore.

## 2022-12-26 NOTE — ED Provider Notes (Signed)
  Physical Exam  BP 133/60   Pulse (!) 107   Temp 98.4 F (36.9 C) (Oral)   Resp 18   SpO2 96%   Physical Exam  Procedures  Procedures  ED Course / MDM   Clinical Course as of 12/26/22 2024  [facility] Dec 26, 2022  1421 EKG 12-Lead Sinus rhythm.  Rate of 117.  Normal intervals.  No axis deviation.  No ST segment changes.  Overall nonischemic ECG. [JR]  1504 S- hx COPD, here for SOB, URI sx x1 day. Duoneb and solumedrol prior to arrival, tachycardic, getting more duonebs, rocephin, azithro. Declined covid/flu -Fu labs -DC with prednisone, azithro [HJ]    Clinical Course User Index [HJ] Janyth Pupa, MD [JR] Rolla Flatten, MD   Medical Decision Making Amount and/or Complexity of Data Reviewed Labs: ordered. Radiology: ordered. ECG/medicine tests:  Decision-making details documented in ED Course.  Risk Prescription drug management.   BMP with no gross metabolic or electrolyte abnormality.  CBC with no leukocytosis or anemia.  Patient reevaluated after DuoNeb's and reports that she feels almost completely back to normal.  Lungs are clear to auscultation bilaterally and work of breathing is normal.  Patient is saturating well on room air.  She is mildly tachycardic at discharge which is likely secondary to [facility] administration.  We discussed that she is likely experiencing a COPD exacerbation and should continue taking prednisone and azithromycin as prescribed.  Patient voiced appreciation for her care in the ED and reported she is excited to get home to her daughter-in-law.  Encouraged her to follow-up with her primary care physician.  Strict return precautions were discussed, patient voiced understanding.  She was discharged in stable condition.       Janyth Pupa, MD 12/26/22 2024    Charlynne Pander, MD 12/26/22 [phone]

## 2022-12-26 NOTE — ED Notes (Signed)
Pt desatting to 84% on room air. Placed on Cross 2L. SpO2 now 100%.

## 2022-12-26 NOTE — ED Provider Notes (Signed)
[place] EMERGENCY DEPARTMENT AT [facility] Provider Note   CSN: [id number] Arrival date & time: 12/26/22  1400     History  Chief Complaint  Patient presents with   Shortness of Breath    [name] [name] is a 72 y.o. female.  72 year old female with a past medical history of gold III criteria COPD, prior spontaneous pneumothorax, hyperlipidemia, GERD presents here for 1 day of shortness of breath.  Patient reports becoming short of breath yesterday.  She was using her inhalers and nebulizer treatments at home.  However, this was not relieving her symptoms.  She does have known sick contacts and her daughter-in-law and grandson, who both have URI symptoms and cough.  Her grandson was tested for COVID and flu, and tested negative for both of these.  She denies having associated fever.  She has not had any wheezing, but does report feeling a tightness sensation in her chest.  Denies any chest pain.  She does have an associated cough with this.  Her cough is newly productive of sputum.  At baseline, she has an intermittent cough associated with her COPD.  However, her typical cough is nonproductive in nature.  Patient has no prior history of DVT.  She denies any lower extremity edema.  The history is limited by a language barrier. A language interpreter was used [employer] Spanish interpreter).  Shortness of Breath      Home Medications Prior to Admission medications   Medication Sig Start Date End Date Taking? Authorizing Provider  acetaminophen (TYLENOL) 500 MG tablet Take 1 tablet (500 mg total) by mouth every 6 (six) hours as needed. 03/18/16   Loletta Specter, PA-C  albuterol (PROVENTIL) (2.5 MG/3ML) 0.083% nebulizer solution Take 3 mLs (2.5 mg total) by nebulization every 4 (four) hours as needed for wheezing or shortness of breath. 09/09/21   Nyoka Cowden, MD  albuterol (VENTOLIN HFA) 108 (90 Base) MCG/ACT inhaler INHALE 2 PUFFS BY MOUTH EVERY 4 HOURS AS NEEDED ONLY IF  YOU CANT CANNOT CATCH YOUR BREATH 12/05/22   Nyoka Cowden, MD  atorvastatin (LIPITOR) 20 MG tablet Take 1 tablet (20 mg total) by mouth at bedtime. 01/09/22   Wanda Plump, MD  Budeson-Glycopyrrol-Formoterol (BREZTRI AEROSPHERE) 160-9-4.8 MCG/ACT AERO Inhale 2 puffs into the lungs in the morning and at bedtime. 10/01/22   Nyoka Cowden, MD  buPROPion (WELLBUTRIN) 75 MG tablet 1 tablet daily x10 days, then 1 tablet twice daily 04/11/22   Arrie Senate, FNP  Calcium Carbonate (CALCIUM 600 PO) Take by mouth.    [provider]  carboxymethylcellulose (REFRESH PLUS) 0.5 % SOLN 1 drop 3 (three) times daily as needed.    [provider]  cetirizine (ZYRTEC) 10 MG tablet Take 1 tablet (10 mg total) by mouth daily. Patient taking differently: Take 10 mg by mouth daily as needed for allergies. 04/27/19   Hoy Register, MD  Ergocalciferol 50 MCG (2000 UT) CAPS Take by mouth.    [provider]  famotidine (PEPCID) 20 MG tablet One after supper 09/17/22   Nyoka Cowden, MD  ibuprofen (ADVIL,MOTRIN) 200 MG tablet Take 400 mg by mouth every 6 (six) hours as needed for headache or moderate pain.    [provider]  ipratropium-albuterol (DUONEB) 0.5-2.5 (3) MG/3ML SOLN INHALE 1 NEBULE VIA NEBULIZER EVERY 6 HOURS AS NEEDED 12/24/22   Nyoka Cowden, MD  Multiple Vitamin (MULTIVITAMIN WITH MINERALS) TABS tablet Take 1 tablet by mouth daily.  [provider]  pantoprazole (PROTONIX) 40 MG tablet Take 1 tablet (40 mg total) by mouth daily. Take 30-60 min before first meal of the day 09/17/22   Nyoka Cowden, MD      Allergies    Patient has no known allergies.    Review of Systems   As noted in HPI  Physical Exam Updated Vital Signs There were no vitals taken for this visit. Physical Exam Vitals reviewed.  Constitutional:      General: She is not in acute distress.    Appearance: Normal appearance. She is normal weight. She is not ill-appearing,  toxic-appearing or diaphoretic.  HENT:     Head: Normocephalic.     Nose: Nose normal.     Mouth/Throat:     Mouth: Mucous membranes are moist.  Cardiovascular:     Rate and Rhythm: Regular rhythm. Tachycardia present.     Pulses: Normal pulses.          Radial pulses are 2+ on the right side and 2+ on the left side.       Dorsalis pedis pulses are 2+ on the right side and 2+ on the left side.     Heart sounds: Normal heart sounds. No murmur heard.    No friction rub. No gallop.  Pulmonary:     Effort: Pulmonary effort is normal. No respiratory distress.     Breath sounds: Examination of the right-upper field reveals decreased breath sounds. Examination of the left-upper field reveals decreased breath sounds. Decreased breath sounds and rhonchi present. No wheezing or rales.  Abdominal:     General: There is no distension.     Palpations: Abdomen is soft.     Tenderness: There is no abdominal tenderness. There is no guarding or rebound.  Musculoskeletal:     Right lower leg: No edema.     Left lower leg: No edema.  Skin:    General: Skin is warm and dry.  Neurological:     Mental Status: She is alert.     ED Results / Procedures / Treatments   Labs (all labs ordered are listed, but only abnormal results are displayed) Labs Reviewed - No data to display  EKG None  Radiology No results found.  Procedures Procedures    Medications Ordered in ED Medications - No data to display  ED Course/ Medical Decision Making/ A&P Clinical Course as of 12/26/22 1713  Fri Dec 26, 2022  1421 EKG 12-Lead Sinus rhythm.  Rate of 117.  Normal intervals.  No axis deviation.  No ST segment changes.  Overall nonischemic ECG. [JR]  1504 S- hx COPD, here for SOB, URI sx x1 day. Duoneb and solumedrol prior to arrival, tachycardic, getting more duonebs, rocephin, azithro. Declined covid/flu -Fu labs -DC with prednisone, azithro [HJ]    Clinical Course User Index [HJ] Janyth Pupa,  MD [JR] Rolla Flatten, MD                                 Medical Decision Making Amount and/or Complexity of Data Reviewed Labs: ordered. Radiology: ordered and independent interpretation performed. ECG/medicine tests: ordered and independent interpretation performed. Decision-making details documented in ED Course.  Risk Prescription drug management.   72 year old female presents here for shortness of breath with worsened cough that is newly productive of sputum.  Afebrile, tachycardic, tachypneic on arrival here.  Patient arrives on 2 L nasal cannula.  During  my evaluation, supplemental oxygen titrated off.  Throughout evaluation, patient maintained O2 saturation of 95% or greater.  She did not demonstrate any increased work of breathing.  The patient was speaking in full sentences.  She does have diminished aeration, most prominent in the upper lobes.  Some scattered, coarse rhonchi.  No wheezing noted.  However, patient endorsing a tightness sensation in her chest.  She does report having some relief with the DuoNeb administered by EMS prior to arrival.  Initial differential diagnosis includes COPD exacerbation, pneumonia, pneumothorax, PE, URI.  Chest x-ray obtained.  I independently reviewed this image, there is no evidence of pneumothorax.  She does have some prominent interstitial markings in the bilateral lower lobes, which are consistent with her known chronic pulmonary disease.  No focal opacities consistent with pneumonia.  I considered PE.  However, patient received nebulizer treatment prior to arrival, which I suspect is what is driving her tachycardia.  She has no prior history of DVT.  Her symptoms would be more consistent with URI or COPD exacerbation rather than PE given her known exposure to sick contacts, worsened cough, and newly productive cough.  Patient also states that her symptoms feel similar nature to prior COPD exacerbations.  Do not feel that CTA of her chest is  indicated.  Will give symptomatic treatment with 2 additional DuoNeb nebulized treatments.  Patient received 125 mg Solu-Medrol with EMS prior to arrival.  No indication for additional steroid treatment here.  Based on gold criteria, patient does have severe COPD.  With her new sputum production, feel that she would benefit from empiric antibiotic therapy.  Patient given 1 g of IV Rocephin and 500 mg p.o. azithromycin.  On reevaluation after nebulizer treatment, patient endorses significant improvement in her symptoms.  Tachycardia is somewhat improved to the low 100s.  Laboratory workup is pending at the conclusion of my shift.  Care of this patient transferred to Dr. Jean Rosenthal.  Patient's presentation is most consistent with exacerbation of chronic illness.         Final Clinical Impression(s) / ED Diagnoses Final diagnoses:  COPD exacerbation [facility])    Rx / DC Orders ED Discharge Orders     None         Rolla Flatten, MD 12/26/22 Lyndal Rainbow, MD 12/26/22 [phone]

## 2022-12-28 NOTE — Progress Notes (Deleted)
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     10/31/2014 Preoperative Dx:  Spontaneous left pneumothorax with persistent air leak Postoperative Dx: same Procedure: Left video-assisted thoracoscopy, stapling of apical bleb, mechanical pleurodesis   11/10/2014 1st [facility] office visit/ [name] [name]   Chief Complaint  Patient presents with   PULMONARY CONSULT    Spontaneous Left Pneumothorax. Pt states that she has increased dyspnea with exertion. Pt c/o of occasional wet cough with yellow to clear mucus and chest pain with breathing. Pt denies any wheeze.   still way overusing saba at baseline mostly in neb form with doe x across the room and not using symbicort or pain meds correctly (see a/p) rec Plan A = Automatic = Symbiocrt 160 one twice daily and Incruse one click each am  Plan B = Backup - Only use your albuterol (Yellow/proventil) as a rescue medication Plan C = Crisis/ contingency - nebulizer, ok use with albuterol (or combination until you use it up)  every 4 hours if plan B is not working  For pain > tramadol 50 mg up to 2 every 4 hours  For nerves> alprazolam 0.5 mg up to one every 4 hours    09/17/2022  f/u ov/[name] [name] re: GOLD 3   maint on breztri  plus maint neb/ off cigs x 3 m  Chief Complaint  Patient presents with   Follow-up    Patient wants to know about Cancer Screening. Pt having SOB more often with exertion. Pt states neb meds not working anymore.  Dyspnea:  treadmill x 30 min helps her chest tight sensation which tends to be more at rest   Cough: none  Sleeping: flat bed one pillow  SABA use: using neb not hfa  02: none  Rec Plan A = Automatic = Always=    Breztri Take 2 puffs first thing  in am and then another 2 puffs about 12 hours later.  Work on inhaler technique:  Plan B = Backup (to supplement plan A, not to replace it) Only use your albuterol inhaler as a rescue medication  Plan C = Crisis (instead of Plan B but only if Plan B stops working) - only use your albuterol nebulizer if you first try Plan B   Also  Ok to try albuterol 15 min before an activity (on alternating days)  that you know would usually make you short of breath  Pantoprazole (protonix) 40 mg   Take  30-60 min before first meal of the day and Pepcid (famotidine)  20 mg after supper until return to office  GERD diet reviewed, bed blocks rec      Please schedule a follow up visit in 3 months but call sooner if needed - bring all you medications/inhalers/solutions with you  12/26/22  HC03 20   12/29/2022  f/u ov/[name] [name] re: GOLD 3 copd    maint on ***  No chief complaint on file.   Dyspnea:  *** Cough: *** Sleeping: *** resp cc  SABA use: *** 02: ***  Lung cancer screening :  ***    No obvious day to day or daytime variability or assoc excess/  purulent sputum or mucus plugs or hemoptysis or cp or chest tightness, subjective wheeze or overt sinus or hb symptoms.    Also denies any obvious fluctuation of symptoms with weather or environmental changes or other aggravating or alleviating factors except as outlined above   No unusual exposure hx or h/o childhood pna/ asthma or knowledge of premature birth.  Current Allergies, Complete Past Medical History, Past Surgical History, Family History, and Social History were reviewed in [employer] record.  ROS  The following are not active complaints unless bolded Hoarseness, sore throat, dysphagia, dental problems, itching, sneezing,  nasal congestion or discharge of excess mucus or purulent secretions, ear ache,   fever, chills, sweats, unintended wt loss or wt gain, classically pleuritic or exertional cp,  orthopnea pnd or arm/hand  swelling  or leg swelling, presyncope, palpitations, abdominal pain, anorexia, nausea, vomiting, diarrhea  or change in bowel habits or change in bladder habits, change in stools or change in urine, dysuria, hematuria,  rash, arthralgias, visual complaints, headache, numbness, weakness or ataxia or problems with walking or coordination,  change in mood or  memory.        No outpatient medications have been marked as taking for the 12/29/22 encounter (Appointment) with Nyoka Cowden, MD.              Objective:   Physical Exam  Wts  12/29/2022       ***  09/17/2022        158  09/09/2021          166  02/21/2021        168   06/12/2020       179 03/22/2020        176 06/27/2019        184   12/24/2018    180  11/24/2014      177 > 01/31/2015 180 >  08/29/2016    190     11/10/14 177 lb 3.2 oz (80.377 kg)  11/08/14 178 lb 5 oz (80.882 kg)  10/26/14 177 lb 1.6 oz (80.332 kg)    Vital signs reviewed  12/29/2022  - Note at rest 02 sats  ***% on ***   General appearance:    ***   Mild barr***    Assessment & Plan:

## 2022-12-29 ENCOUNTER — Ambulatory Visit: Payer: 59 | Admitting: Internal Medicine

## 2022-12-29 ENCOUNTER — Telehealth: Payer: Self-pay | Admitting: Internal Medicine

## 2022-12-29 DIAGNOSIS — J449 Chronic obstructive pulmonary disease, unspecified: Secondary | ICD-10-CM

## 2022-12-29 NOTE — Telephone Encounter (Signed)
Patient's son called to cancel patient's appt due to no transportation, rescheduled for 02/27/2023. Patient is needing nebulizer solution called into [facility] in [place], her meds are expired. Please advise

## 2022-12-30 ENCOUNTER — Telehealth: Payer: Self-pay | Admitting: Internal Medicine

## 2022-12-30 MED ORDER — PREDNISONE 10 MG PO TABS: 10.0000 mg | ORAL_TABLET | Freq: Every day | ORAL | 0 refills | Status: DC

## 2022-12-30 MED ORDER — ALBUTEROL SULFATE (2.5 MG/3ML) 0.083% IN NEBU: 2.5000 mg | INHALATION_SOLUTION | RESPIRATORY_TRACT | 12 refills | Status: DC | PRN

## 2022-12-30 NOTE — Telephone Encounter (Signed)
Nebulizer solution refilled.

## 2022-12-30 NOTE — Telephone Encounter (Signed)
Pt son calling in to get pt a prescription sent in to avoid her from having a copd flare up  [id number] - [place], [place] - [address]

## 2022-12-30 NOTE — Telephone Encounter (Signed)
Missed appt yesterday so won't be able to continue to prescribe if keeps missing appts but for now:  Prednisone 10 mg take  4 each am x 2 days,   2 each am x 2 days,  1 each am x 2 days and stop

## 2022-12-30 NOTE — Telephone Encounter (Signed)
Called and spoke with pts son ( as listed in dpr ) Rapheal  states he's mother is having a flare up. Described symptoms of sob, chest congestion and low oxygen stats. Picked up [employer] from the pharmacy and using nebs. Dr.Wert please advise

## 2022-12-30 NOTE — Telephone Encounter (Signed)
I called and spoke with the pt's daughter and notified of response per Dr Sherene Sires He verbalized understanding Nothing further needed Rx has been sent to [facility]

## 2022-12-31 ENCOUNTER — Inpatient Hospital Stay (HOSPITAL_COMMUNITY)
Admission: EM | Admit: 2022-12-31 | Discharge: 2023-01-06 | DRG: 193 | Disposition: A | Discharge status: Home / Self Care | Payer: 59 | Attending: Internal Medicine | Admitting: Internal Medicine

## 2022-12-31 ENCOUNTER — Other Ambulatory Visit: Payer: Self-pay

## 2022-12-31 ENCOUNTER — Emergency Department (HOSPITAL_COMMUNITY): Payer: 59

## 2022-12-31 DIAGNOSIS — Z6830 Body mass index (BMI) 30.0-30.9, adult: Secondary | ICD-10-CM

## 2022-12-31 DIAGNOSIS — Z825 Family history of asthma and other chronic lower respiratory diseases: Secondary | ICD-10-CM

## 2022-12-31 DIAGNOSIS — Z87891 Personal history of nicotine dependence: Secondary | ICD-10-CM

## 2022-12-31 DIAGNOSIS — Z7952 Long term (current) use of systemic steroids: Secondary | ICD-10-CM

## 2022-12-31 DIAGNOSIS — J123 Human metapneumovirus pneumonia: Principal | ICD-10-CM | POA: Diagnosis present

## 2022-12-31 DIAGNOSIS — E669 Obesity, unspecified: Secondary | ICD-10-CM | POA: Diagnosis present

## 2022-12-31 DIAGNOSIS — J440 Chronic obstructive pulmonary disease with acute lower respiratory infection: Secondary | ICD-10-CM | POA: Diagnosis present

## 2022-12-31 DIAGNOSIS — J439 Emphysema, unspecified: Secondary | ICD-10-CM | POA: Diagnosis present

## 2022-12-31 DIAGNOSIS — E440 Moderate protein-calorie malnutrition: Secondary | ICD-10-CM | POA: Diagnosis present

## 2022-12-31 DIAGNOSIS — H9190 Unspecified hearing loss, unspecified ear: Secondary | ICD-10-CM | POA: Diagnosis present

## 2022-12-31 DIAGNOSIS — J9601 Acute respiratory failure with hypoxia: Secondary | ICD-10-CM | POA: Diagnosis not present

## 2022-12-31 DIAGNOSIS — Z79899 Other long term (current) drug therapy: Secondary | ICD-10-CM

## 2022-12-31 DIAGNOSIS — Z8616 Personal history of COVID-19: Secondary | ICD-10-CM

## 2022-12-31 DIAGNOSIS — J441 Chronic obstructive pulmonary disease with (acute) exacerbation: Secondary | ICD-10-CM | POA: Diagnosis not present

## 2022-12-31 DIAGNOSIS — Z9842 Cataract extraction status, left eye: Secondary | ICD-10-CM

## 2022-12-31 DIAGNOSIS — Z833 Family history of diabetes mellitus: Secondary | ICD-10-CM

## 2022-12-31 DIAGNOSIS — E7800 Pure hypercholesterolemia, unspecified: Secondary | ICD-10-CM | POA: Diagnosis present

## 2022-12-31 DIAGNOSIS — F419 Anxiety disorder, unspecified: Secondary | ICD-10-CM | POA: Diagnosis present

## 2022-12-31 DIAGNOSIS — Z801 Family history of malignant neoplasm of trachea, bronchus and lung: Secondary | ICD-10-CM

## 2022-12-31 DIAGNOSIS — Z8249 Family history of ischemic heart disease and other diseases of the circulatory system: Secondary | ICD-10-CM

## 2022-12-31 LAB — I-STAT VENOUS BLOOD GAS, ED
Acid-Base Excess: 3 mmol/L — ABNORMAL HIGH (ref 0.0–2.0)
Bicarbonate: 27.5 mmol/L (ref 20.0–28.0)
Calcium, Ion: 1.13 mmol/L — ABNORMAL LOW (ref 1.15–1.40)
HCT: 42 % (ref 36.0–46.0)
Hemoglobin: 14.3 g/dL (ref 12.0–15.0)
O2 Saturation: 100 %
Potassium: 3.7 mmol/L (ref 3.5–5.1)
Sodium: 139 mmol/L (ref 135–145)
TCO2: 29 mmol/L (ref 22–32)
pCO2, Ven: 42.3 mm[Hg] — ABNORMAL LOW (ref 44–60)
pH, Ven: 7.421 (ref 7.25–7.43)
pO2, Ven: 185 mm[Hg] — ABNORMAL HIGH (ref 32–45)

## 2022-12-31 LAB — RESP PANEL BY RT-PCR (RSV, FLU A&B, COVID)  RVPGX2
Influenza A by PCR: NEGATIVE
Influenza B by PCR: NEGATIVE
Resp Syncytial Virus by PCR: NEGATIVE
SARS Coronavirus 2 by RT PCR: NEGATIVE

## 2022-12-31 LAB — COMPREHENSIVE METABOLIC PANEL
ALT: 34 U/L (ref 0–44)
AST: 33 U/L (ref 15–41)
Albumin: 3.6 g/dL (ref 3.5–5.0)
Alkaline Phosphatase: 64 U/L (ref 38–126)
Anion gap: 10 (ref 5–15)
BUN: 19 mg/dL (ref 8–23)
CO2: 25 mmol/L (ref 22–32)
Calcium: 9.2 mg/dL (ref 8.9–10.3)
Chloride: 103 mmol/L (ref 98–111)
Creatinine, Ser: 0.68 mg/dL (ref 0.44–1.00)
GFR, Estimated: >60 mL/min (ref >60)
Glucose, Bld: 112 mg/dL — ABNORMAL HIGH (ref 70–99)
Potassium: 3.8 mmol/L (ref 3.5–5.1)
Sodium: 138 mmol/L (ref 135–145)
Total Bilirubin: 0.9 mg/dL (ref <1.2)
Total Protein: 7.4 g/dL (ref 6.5–8.1)

## 2022-12-31 LAB — CBC WITH DIFFERENTIAL/PLATELET
Abs Immature Granulocytes: 0.13 10*3/uL — ABNORMAL HIGH (ref 0.00–0.07)
Basophils Absolute: 0 10*3/uL (ref 0.0–0.1)
Basophils Relative: 0 %
Eosinophils Absolute: 0 10*3/uL (ref 0.0–0.5)
Eosinophils Relative: 0 %
HCT: 42 % (ref 36.0–46.0)
Hemoglobin: 14 g/dL (ref 12.0–15.0)
Immature Granulocytes: 1 %
Lymphocytes Relative: 11 %
Lymphs Abs: 1.2 10*3/uL (ref 0.7–4.0)
MCH: 27.8 pg (ref 26.0–34.0)
MCHC: 33.3 g/dL (ref 30.0–36.0)
MCV: 83.5 fL (ref 80.0–100.0)
Monocytes Absolute: 0.6 10*3/uL (ref 0.1–1.0)
Monocytes Relative: 5 %
Neutro Abs: 9.1 10*3/uL — ABNORMAL HIGH (ref 1.7–7.7)
Neutrophils Relative %: 83 %
Platelets: 390 10*3/uL (ref 150–400)
RBC: 5.03 MIL/uL (ref 3.87–5.11)
RDW: 12.8 % (ref 11.5–15.5)
WBC: 11 10*3/uL — ABNORMAL HIGH (ref 4.0–10.5)
nRBC: 0 % (ref 0.0–0.2)

## 2022-12-31 LAB — TROPONIN I (HIGH SENSITIVITY): Troponin I (High Sensitivity): 11 ng/L (ref <18)

## 2022-12-31 LAB — BRAIN NATRIURETIC PEPTIDE: B Natriuretic Peptide: 56.7 pg/mL (ref 0.0–100.0)

## 2022-12-31 MED ORDER — IPRATROPIUM-ALBUTEROL 0.5-2.5 (3) MG/3ML IN SOLN
3.0000 mL | RESPIRATORY_TRACT | Status: DC
  Administered 2023-01-01 (×4): 3 mL via RESPIRATORY_TRACT
  Filled 2022-12-31 (×4): qty 3

## 2022-12-31 MED ORDER — MAGNESIUM SULFATE 2 GM/50ML IV SOLN
2.0000 g | Freq: Once | INTRAVENOUS | Status: AC
  Administered 2022-12-31: 2 g via INTRAVENOUS
  Filled 2022-12-31: qty 50

## 2022-12-31 MED ORDER — METHYLPREDNISOLONE SODIUM SUCC 125 MG IJ SOLR
125.0000 mg | Freq: Once | INTRAMUSCULAR | Status: AC
  Administered 2022-12-31: 125 mg via INTRAVENOUS
  Filled 2022-12-31: qty 2

## 2022-12-31 MED ORDER — IPRATROPIUM-ALBUTEROL 0.5-2.5 (3) MG/3ML IN SOLN
3.0000 mL | RESPIRATORY_TRACT | Status: AC
  Administered 2022-12-31 (×3): 3 mL via RESPIRATORY_TRACT
  Filled 2022-12-31: qty 3
  Filled 2022-12-31: qty 6

## 2022-12-31 MED ORDER — ALBUTEROL SULFATE (2.5 MG/3ML) 0.083% IN NEBU
2.5000 mg | INHALATION_SOLUTION | RESPIRATORY_TRACT | Status: DC | PRN
  Administered 2022-12-31 – 2023-01-01 (×3): 2.5 mg via RESPIRATORY_TRACT
  Filled 2022-12-31 (×3): qty 3

## 2022-12-31 NOTE — H&P (Signed)
PCP:   Arrie Senate, FNP   Chief Complaint:  Shortness of breath, wheeze.  HPI: This is a 72 year old female with past medical history of COPD gold standard 3, COPD, anxiety, migraine, HLD.  On 11/22 he presented to the ER with 1 day history of shortness of breath after being exposed to grandson with URI.  She was treated with IV steroids and antibiotics.  She improved in the ER.  Discharged home on Z-Pak and p.o. prednisone.  Since discharge she has shortness of breath that has not improved despite compliance with medication.  On the 26 she called her pulmonologist who added additional doses of p.o. prednisone.  Her shortness of breath persisted.  She denies fever, chills, nausea, vomiting.  She presented to the ER today.  In the ER patient hypoxic requiring 4 L oxygen via nasal cannula.  ABG reassuring pH 7.42, normal CO2 42.3.  Respiratory panel negative.  CXR nonspecific interstitial airspace opacities improved since last imaging.  Advanced COPD.  Patient with wheezing and tachypnea on physical examination by EDP.  Patient treated with IV magnesium, Solu-Medrol 125 mg, nebulizers.  Patient remains significantly short of breath/dyspneic.  Admission requested.  Review of Systems:  Per HPI  Past Medical History: Past Medical History:  Diagnosis Date   Anxiety    Complication of anesthesia    COPD (chronic obstructive pulmonary disease) (HCC)    COVID-19    X2   Diverticulitis    Dyspnea    Migraines    Tobacco use disorder 02/21/2021   Quit 02/2021 at age 33  - referred for annual screeing due 02/2021     Past Surgical History:  Procedure Laterality Date   CATARACT EXTRACTION Left    CESAREAN SECTION     STAPLING OF BLEBS Left 10/31/2014   Procedure: STAPLING OF BLEBS;  Surgeon: Alleen Borne, MD;  Location: [facility] OR;  Service: Thoracic;  Laterality: Left;   VIDEO ASSISTED THORACOSCOPY Left 10/31/2014   Procedure: LEFT VIDEO ASSISTED THORACOSCOPY;  Surgeon: Alleen Borne,  MD;  Location: [facility] OR;  Service: Thoracic;  Laterality: Left;    Medications: Prior to Admission medications   Medication Sig Start Date End Date Taking? Authorizing Provider  acetaminophen (TYLENOL) 500 MG tablet Take 1 tablet (500 mg total) by mouth every 6 (six) hours as needed. 03/18/16   Loletta Specter, PA-C  albuterol (PROVENTIL) (2.5 MG/3ML) 0.083% nebulizer solution Take 3 mLs (2.5 mg total) by nebulization every 4 (four) hours as needed for wheezing or shortness of breath. 12/30/22   Nyoka Cowden, MD  albuterol (VENTOLIN HFA) 108 (90 Base) MCG/ACT inhaler INHALE 2 PUFFS BY MOUTH EVERY 4 HOURS AS NEEDED ONLY IF YOU CANT CANNOT CATCH YOUR BREATH 12/05/22   Nyoka Cowden, MD  atorvastatin (LIPITOR) 20 MG tablet Take 1 tablet (20 mg total) by mouth at bedtime. 01/09/22   Wanda Plump, MD  Budeson-Glycopyrrol-Formoterol (BREZTRI AEROSPHERE) 160-9-4.8 MCG/ACT AERO Inhale 2 puffs into the lungs in the morning and at bedtime. 10/01/22   Nyoka Cowden, MD  buPROPion (WELLBUTRIN) 75 MG tablet 1 tablet daily x10 days, then 1 tablet twice daily 04/11/22   Arrie Senate, FNP  Calcium Carbonate (CALCIUM 600 PO) Take by mouth.    [provider]  carboxymethylcellulose (REFRESH PLUS) 0.5 % SOLN 1 drop 3 (three) times daily as needed.    [provider]  cetirizine (ZYRTEC) 10 MG tablet Take 1 tablet (10 mg total) by mouth daily. Patient  taking differently: Take 10 mg by mouth daily as needed for allergies. 04/27/19   Hoy Register, MD  Ergocalciferol 50 MCG (2000 UT) CAPS Take by mouth.    [provider]  famotidine (PEPCID) 20 MG tablet One after supper 09/17/22   Nyoka Cowden, MD  ibuprofen (ADVIL,MOTRIN) 200 MG tablet Take 400 mg by mouth every 6 (six) hours as needed for headache or moderate pain.    [provider]  ipratropium-albuterol (DUONEB) 0.5-2.5 (3) MG/3ML SOLN INHALE 1 NEBULE VIA NEBULIZER EVERY 6 HOURS AS NEEDED 12/24/22   Nyoka Cowden, MD  Multiple Vitamin (MULTIVITAMIN WITH MINERALS) TABS tablet Take 1 tablet by mouth daily.    [provider]  pantoprazole (PROTONIX) 40 MG tablet Take 1 tablet (40 mg total) by mouth daily. Take 30-60 min before first meal of the day 09/17/22   Nyoka Cowden, MD  predniSONE (DELTASONE) 10 MG tablet Take 1 tablet (10 mg total) by mouth daily with breakfast. 12/30/22   Nyoka Cowden, MD    Allergies:  No Known Allergies  Social History:  reports that she quit smoking about 16 months ago. Her smoking use included cigarettes. She has never used smokeless tobacco. She reports that she does not drink alcohol and does not use drugs.  Family History: Family History  Problem Relation Age of Onset   Lung cancer Father    COPD Sister    Cancer Brother        type?   CAD Brother    Diabetes Brother    Colon cancer Neg Hx    Breast cancer Neg Hx     Physical Exam: Vitals:   12/31/22 2015 12/31/22 2200 12/31/22 2300  BP: (!) 165/93 (!) 161/85 (!) 156/86  Pulse: 100 99 (!) 107  Resp: (!) 24 (!) 22 (!) 22  SpO2: 99% 99% 99%    General: A&O x 3, well-developed, dyspneic/pursed lip breathing, anxious Eyes: Pink conjunctiva, no scleral icterus ENT: Moist oral mucosa, neck supple, no thyromegaly Lungs: Tight lungs, scant air exchange, some accessory muscle use, tachypneic.  Scant wheezing heard Cardiovascular: Tachycardic, RRR, No carotid bruits, no JVD Abdomen: soft, positive BS, NTND, not an acute abdomen GU: not examined Neuro: CN II - XII grossly intact, sensation intact Musculoskeletal: strength 5/5 all extremities, no edema Skin: no rash, no subcutaneous crepitation, no decubitus Psych: appropriate patient  Labs on Admission:  Recent Labs    12/31/22 2017 12/31/22 2029  NA 138 139  K 3.8 3.7  CL 103  --   CO2 25  --   GLUCOSE 112*  --   BUN 19  --   CREATININE 0.68  --   CALCIUM 9.2  --    Recent Labs    12/31/22 2017  AST 33  ALT 34   ALKPHOS 64  BILITOT 0.9  PROT 7.4  ALBUMIN 3.6    Recent Labs    12/31/22 2017 12/31/22 2029  WBC 11.0*  --   NEUTROABS 9.1*  --   HGB 14.0 14.3  HCT 42.0 42.0  MCV 83.5  --   PLT 390  --     Micro Results: Recent Results (from the past 240 hour(s))  Resp panel by RT-PCR (RSV, Flu A&B, Covid) Anterior Nasal Swab     Status: None   Collection Time: 12/26/22  3:05 PM   Specimen: Anterior Nasal Swab  Result Value Ref Range Status   SARS Coronavirus 2 by RT PCR NEGATIVE NEGATIVE  Final   Influenza A by PCR NEGATIVE NEGATIVE Final   Influenza B by PCR NEGATIVE NEGATIVE Final    Comment: (NOTE) The Xpert Xpress SARS-CoV-2/FLU/RSV plus assay is intended as an aid in the diagnosis of influenza from Nasopharyngeal swab specimens and should not be used as a sole basis for treatment. Nasal washings and aspirates are unacceptable for Xpert Xpress SARS-CoV-2/FLU/RSV testing.  Fact Sheet for Patients: BloggerCourse.com  Fact Sheet for Healthcare Providers: SeriousBroker.it  This test is not yet approved or cleared by the [place] FDA and has been authorized for detection and/or diagnosis of SARS-CoV-2 by FDA under an Emergency Use Authorization (EUA). This EUA will remain in effect (meaning this test can be used) for the duration of the COVID-19 declaration under Section 564(b)(1) of the Act, 21 U.S.C. section 360bbb-3(b)(1), unless the authorization is terminated or revoked.     Resp Syncytial Virus by PCR NEGATIVE NEGATIVE Final    Comment: (NOTE) Fact Sheet for Patients: BloggerCourse.com  Fact Sheet for Healthcare Providers: SeriousBroker.it  This test is not yet approved or cleared by the [place] FDA and has been authorized for detection and/or diagnosis of SARS-CoV-2 by FDA under an Emergency Use Authorization (EUA). This EUA will remain in effect (meaning  this test can be used) for the duration of the COVID-19 declaration under Section 564(b)(1) of the Act, 21 U.S.C. section 360bbb-3(b)(1), unless the authorization is terminated or revoked.  Performed at [facility], [address], [place], [place] [address]   Resp panel by RT-PCR (RSV, Flu A&B, Covid) Anterior Nasal Swab     Status: None   Collection Time: 12/31/22  8:25 PM   Specimen: Anterior Nasal Swab  Result Value Ref Range Status   SARS Coronavirus 2 by RT PCR NEGATIVE NEGATIVE Final   Influenza A by PCR NEGATIVE NEGATIVE Final   Influenza B by PCR NEGATIVE NEGATIVE Final    Comment: (NOTE) The Xpert Xpress SARS-CoV-2/FLU/RSV plus assay is intended as an aid in the diagnosis of influenza from Nasopharyngeal swab specimens and should not be used as a sole basis for treatment. Nasal washings and aspirates are unacceptable for Xpert Xpress SARS-CoV-2/FLU/RSV testing.  Fact Sheet for Patients: BloggerCourse.com  Fact Sheet for Healthcare Providers: SeriousBroker.it  This test is not yet approved or cleared by the [place] FDA and has been authorized for detection and/or diagnosis of SARS-CoV-2 by FDA under an Emergency Use Authorization (EUA). This EUA will remain in effect (meaning this test can be used) for the duration of the COVID-19 declaration under Section 564(b)(1) of the Act, 21 U.S.C. section 360bbb-3(b)(1), unless the authorization is terminated or revoked.     Resp Syncytial Virus by PCR NEGATIVE NEGATIVE Final    Comment: (NOTE) Fact Sheet for Patients: BloggerCourse.com  Fact Sheet for Healthcare Providers: SeriousBroker.it  This test is not yet approved or cleared by the [place] FDA and has been authorized for detection and/or diagnosis of SARS-CoV-2 by FDA under an Emergency Use Authorization (EUA). This EUA will remain in effect  (meaning this test can be used) for the duration of the COVID-19 declaration under Section 564(b)(1) of the Act, 21 U.S.C. section 360bbb-3(b)(1), unless the authorization is terminated or revoked.  Performed at [facility], [address] [place], [place] [address]      Radiological Exams on Admission: DG Chest Port 1 View  Result Date: 12/31/2022 CLINICAL DATA:  Shortness of breath. EXAM: PORTABLE CHEST 1 VIEW COMPARISON:  12/26/2022 FINDINGS:  Slightly improved nonspecific interstitial and airspace opacities in the mid and lower lungs compared to 12/26/2022. Lucencies in the upper lungs suggestive of bullous change. Hyperinflation. Stable cardiomediastinal silhouette. Aortic atherosclerotic calcification. IMPRESSION: Slightly improved nonspecific interstitial and airspace opacities in the mid and lower lungs compared to 12/26/2022. Advanced emphysema Electronically Signed   By: Minerva Fester M.D.   On: 12/31/2022 21:26    Assessment/Plan Present on Admission:  Acute exacerbation of COPD  Acute respiratory failure with hypoxia -Continue IV steroids 60 mg IV every 12 -Scheduled and as needed nebulizers ordered -Scheduled Mucinex, and PRN tessalon ordered -Legionella and strep urinary antigens ordered. -IV antibiotics Rocephin and azithromycin ordered -Resp panel negative -Incentive spirometer   Pure hypercholesterolemia  Cleon Signorelli 12/31/2022, 11:35 PM

## 2022-12-31 NOTE — Progress Notes (Addendum)
   12/31/22 2049  Respiratory Assessment  Assessment Type Post-treatment  Respiratory Pattern Labored;Tachypnea  Chest Assessment Chest expansion symmetrical  Bilateral Breath Sounds Diminished;Expiratory wheezes  R Upper  Breath Sounds Diminished;Expiratory wheezes  L Upper Breath Sounds Diminished;Expiratory wheezes  R Lower Breath Sounds Diminished  L Lower Breath Sounds Diminished   Pt lung sounds improved after duo nebx3. Pt says she feels better but not "100%" RT will monitor as needed.

## 2022-12-31 NOTE — ED Notes (Signed)
Spoke to main lab about pts labs that was sent. Per lab they accidentally placed the blood in the "save" bin. Per lab they will run the lab work now.

## 2022-12-31 NOTE — ED Notes (Signed)
Pt attempted to ambulate to restroom, but unable to due to increased SOB.

## 2022-12-31 NOTE — ED Triage Notes (Signed)
Pt BIB [facility] from home, pt c/o SOB. On room air pt was 90%; 98% on 2L. Pt also states she hasn't eaten or drank much in the last 48 hours. On arrival to the facility pt appears to have worsened labored breathing.

## 2022-12-31 NOTE — ED Provider Notes (Signed)
[place] EMERGENCY DEPARTMENT AT [facility] Provider Note   CSN: [id number] Arrival date & time: 12/31/22  2002     History  Chief Complaint  Patient presents with   Shortness of Breath    [name] [name] is a 72 y.o. female.   Shortness of Breath 72 year old female history of COPD present for shortness of breath.  She is felt short of breath for about a week but has been worsening.  She was seen in the ED 5 days ago and discharged.  She is minimal to oral steroids.  No fevers, no production to her cough or hemoptysis.  No history of PE.  No chest pain.  With EMS received a nebulizer which did not significantly help.  Feels like her prior COPD with significant wheezing.     Home Medications Prior to Admission medications   Medication Sig Start Date End Date Taking? Authorizing Provider  acetaminophen (TYLENOL) 500 MG tablet Take 1 tablet (500 mg total) by mouth every 6 (six) hours as needed. 03/18/16   Loletta Specter, PA-C  albuterol (PROVENTIL) (2.5 MG/3ML) 0.083% nebulizer solution Take 3 mLs (2.5 mg total) by nebulization every 4 (four) hours as needed for wheezing or shortness of breath. 12/30/22   Nyoka Cowden, MD  albuterol (VENTOLIN HFA) 108 (90 Base) MCG/ACT inhaler INHALE 2 PUFFS BY MOUTH EVERY 4 HOURS AS NEEDED ONLY IF YOU CANT CANNOT CATCH YOUR BREATH 12/05/22   Nyoka Cowden, MD  atorvastatin (LIPITOR) 20 MG tablet Take 1 tablet (20 mg total) by mouth at bedtime. 01/09/22   Wanda Plump, MD  Budeson-Glycopyrrol-Formoterol (BREZTRI AEROSPHERE) 160-9-4.8 MCG/ACT AERO Inhale 2 puffs into the lungs in the morning and at bedtime. 10/01/22   Nyoka Cowden, MD  buPROPion (WELLBUTRIN) 75 MG tablet 1 tablet daily x10 days, then 1 tablet twice daily 04/11/22   Arrie Senate, FNP  Calcium Carbonate (CALCIUM 600 PO) Take by mouth.    [provider]  carboxymethylcellulose (REFRESH PLUS) 0.5 % SOLN 1 drop 3 (three) times daily as needed.     [provider]  cetirizine (ZYRTEC) 10 MG tablet Take 1 tablet (10 mg total) by mouth daily. Patient taking differently: Take 10 mg by mouth daily as needed for allergies. 04/27/19   Hoy Register, MD  Ergocalciferol 50 MCG (2000 UT) CAPS Take by mouth.    [provider]  famotidine (PEPCID) 20 MG tablet One after supper 09/17/22   Nyoka Cowden, MD  ibuprofen (ADVIL,MOTRIN) 200 MG tablet Take 400 mg by mouth every 6 (six) hours as needed for headache or moderate pain.    [provider]  ipratropium-albuterol (DUONEB) 0.5-2.5 (3) MG/3ML SOLN INHALE 1 NEBULE VIA NEBULIZER EVERY 6 HOURS AS NEEDED 12/24/22   Nyoka Cowden, MD  Multiple Vitamin (MULTIVITAMIN WITH MINERALS) TABS tablet Take 1 tablet by mouth daily.    [provider]  pantoprazole (PROTONIX) 40 MG tablet Take 1 tablet (40 mg total) by mouth daily. Take 30-60 min before first meal of the day 09/17/22   Nyoka Cowden, MD  predniSONE (DELTASONE) 10 MG tablet Take 1 tablet (10 mg total) by mouth daily with breakfast. 12/30/22   Nyoka Cowden, MD      Allergies    Patient has no known allergies.    Review of Systems   Review of Systems  Respiratory:  Positive for shortness of breath.   Review of systems completed and notable as per HPI.  ROS otherwise negative.   Physical Exam Updated Vital Signs BP (!) 156/86   Pulse (!) 107   Resp (!) 22   SpO2 99%  Physical Exam Vitals and nursing note reviewed.  Constitutional:      General: She is not in acute distress.    Appearance: She is well-developed.  HENT:     Head: Normocephalic and atraumatic.  Eyes:     Extraocular Movements: Extraocular movements intact.     Conjunctiva/sclera: Conjunctivae normal.     Pupils: Pupils are equal, round, and reactive to light.  Cardiovascular:     Rate and Rhythm: Normal rate and regular rhythm.     Heart sounds: No murmur heard. Pulmonary:     Effort: Tachypnea present.     Breath sounds:  Decreased breath sounds and wheezing present.  Abdominal:     Palpations: Abdomen is soft.     Tenderness: There is no abdominal tenderness.  Musculoskeletal:        General: No swelling.     Cervical back: Neck supple.     Right lower leg: No tenderness. No edema.     Left lower leg: No tenderness. No edema.  Skin:    General: Skin is warm and dry.     Capillary Refill: Capillary refill takes less than 2 seconds.  Neurological:     Mental Status: She is alert.  Psychiatric:        Mood and Affect: Mood normal.     ED Results / Procedures / Treatments   Labs (all labs ordered are listed, but only abnormal results are displayed) Labs Reviewed  CBC WITH DIFFERENTIAL/PLATELET - Abnormal; Notable for the following components:      Result Value   WBC 11.0 (*)    Neutro Abs 9.1 (*)    Abs Immature Granulocytes 0.13 (*)    All other components within normal limits  COMPREHENSIVE METABOLIC PANEL - Abnormal; Notable for the following components:   Glucose, Bld 112 (*)    All other components within normal limits  I-STAT VENOUS BLOOD GAS, ED - Abnormal; Notable for the following components:   pCO2, Ven 42.3 (*)    pO2, Ven 185 (*)    Acid-Base Excess 3.0 (*)    Calcium, Ion 1.13 (*)    All other components within normal limits  RESP PANEL BY RT-PCR (RSV, FLU A&B, COVID)  RVPGX2  BRAIN NATRIURETIC PEPTIDE  URINALYSIS, ROUTINE W REFLEX MICROSCOPIC  TROPONIN I (HIGH SENSITIVITY)  TROPONIN I (HIGH SENSITIVITY)    EKG EKG Interpretation Date/Time:  Wednesday December 31 2022 20:24:00 EST Ventricular Rate:  102 PR Interval:  122 QRS Duration:  84 QT Interval:  357 QTC Calculation: 465 R Axis:   70  Text Interpretation: Sinus tachycardia Ventricular premature complex Aberrant conduction of SV complex(es) Probable left atrial enlargement Minimal ST depression, inferior leads No significant change since last tracing Confirmed by Fulton Reek [phone]) on 12/31/2022 8:28:15  PM  Radiology DG Chest Port 1 View  Result Date: 12/31/2022 CLINICAL DATA:  Shortness of breath. EXAM: PORTABLE CHEST 1 VIEW COMPARISON:  12/26/2022 FINDINGS: Slightly improved nonspecific interstitial and airspace opacities in the mid and lower lungs compared to 12/26/2022. Lucencies in the upper lungs suggestive of bullous change. Hyperinflation. Stable cardiomediastinal silhouette. Aortic atherosclerotic calcification. IMPRESSION: Slightly improved nonspecific interstitial and airspace opacities in the mid and lower lungs compared to 12/26/2022. Advanced emphysema Electronically Signed   By: Minerva Fester M.D.   On: 12/31/2022 21:26  Procedures Procedures    Medications Ordered in ED Medications  albuterol (PROVENTIL) (2.5 MG/3ML) 0.083% nebulizer solution 2.5 mg (2.5 mg Nebulization Given 12/31/22 2202)  ipratropium-albuterol (DUONEB) 0.5-2.5 (3) MG/3ML nebulizer solution 3 mL (3 mLs Nebulization Not Given 12/31/22 2106)  magnesium sulfate IVPB 2 g 50 mL (0 g Intravenous Stopped 12/31/22 2211)  methylPREDNISolone sodium succinate (SOLU-MEDROL) 125 mg/2 mL injection 125 mg (125 mg Intravenous Given 12/31/22 2023)  ipratropium-albuterol (DUONEB) 0.5-2.5 (3) MG/3ML nebulizer solution 3 mL (3 mLs Nebulization Given 12/31/22 2035)    ED Course/ Medical Decision Making/ A&P                                 Medical Decision Making Amount and/or Complexity of Data Reviewed Labs: ordered. Radiology: ordered.  Risk Prescription drug management. Decision regarding hospitalization.   Medical Decision Making:   MYLEIGH WHITFILL is a 72 y.o. female who presented to the ED today with shortness of breath.  All signs reviewed.  Exam she is mildly tachypneic, slight increased work of breathing with new oxygen requirement.  She has diminished breath sounds with expiratory wheezing bilaterally concerning for COPD exacerbation.  Lower suspicion for PE.  Will evaluate for possible pneumonia.  Will  give breathing treatment, steroids and reassess.   Patient placed on continuous vitals and telemetry monitoring while in ED which was reviewed periodically.  Reviewed and confirmed nursing documentation for past medical history, family history, social history.  Reassessment and Plan:   Breathing improved after treatment but is still quite tight.  No hypercapnia.  Mild leukocytosis.  Chest x-ray without pneumonia.  Troponin is normal.  Low concern for ACS.  I think she needs admission for COPD exacerbation.  Discussed with Dr. Joneen Roach and admitted.   Patient's presentation is most consistent with acute complicated illness / injury requiring diagnostic workup.           Final Clinical Impression(s) / ED Diagnoses Final diagnoses:  COPD exacerbation [facility])    Rx / DC Orders ED Discharge Orders     None         Laurence Spates, MD 12/31/22 2342

## 2023-01-01 ENCOUNTER — Inpatient Hospital Stay (HOSPITAL_COMMUNITY): Payer: 59

## 2023-01-01 ENCOUNTER — Encounter (HOSPITAL_COMMUNITY): Payer: Self-pay | Admitting: Family Medicine

## 2023-01-01 DIAGNOSIS — J123 Human metapneumovirus pneumonia: Secondary | ICD-10-CM | POA: Diagnosis present

## 2023-01-01 DIAGNOSIS — Z87891 Personal history of nicotine dependence: Secondary | ICD-10-CM | POA: Diagnosis not present

## 2023-01-01 DIAGNOSIS — Z7952 Long term (current) use of systemic steroids: Secondary | ICD-10-CM | POA: Diagnosis not present

## 2023-01-01 DIAGNOSIS — J439 Emphysema, unspecified: Secondary | ICD-10-CM | POA: Diagnosis present

## 2023-01-01 DIAGNOSIS — J441 Chronic obstructive pulmonary disease with (acute) exacerbation: Secondary | ICD-10-CM | POA: Diagnosis present

## 2023-01-01 DIAGNOSIS — I5031 Acute diastolic (congestive) heart failure: Secondary | ICD-10-CM | POA: Diagnosis not present

## 2023-01-01 DIAGNOSIS — F419 Anxiety disorder, unspecified: Secondary | ICD-10-CM | POA: Diagnosis present

## 2023-01-01 DIAGNOSIS — Z825 Family history of asthma and other chronic lower respiratory diseases: Secondary | ICD-10-CM | POA: Diagnosis not present

## 2023-01-01 DIAGNOSIS — E669 Obesity, unspecified: Secondary | ICD-10-CM | POA: Diagnosis present

## 2023-01-01 DIAGNOSIS — Z8616 Personal history of COVID-19: Secondary | ICD-10-CM | POA: Diagnosis not present

## 2023-01-01 DIAGNOSIS — Z833 Family history of diabetes mellitus: Secondary | ICD-10-CM | POA: Diagnosis not present

## 2023-01-01 DIAGNOSIS — J9601 Acute respiratory failure with hypoxia: Secondary | ICD-10-CM | POA: Diagnosis present

## 2023-01-01 DIAGNOSIS — Z9842 Cataract extraction status, left eye: Secondary | ICD-10-CM | POA: Diagnosis not present

## 2023-01-01 DIAGNOSIS — Z79899 Other long term (current) drug therapy: Secondary | ICD-10-CM | POA: Diagnosis not present

## 2023-01-01 DIAGNOSIS — E440 Moderate protein-calorie malnutrition: Secondary | ICD-10-CM | POA: Diagnosis present

## 2023-01-01 DIAGNOSIS — Z801 Family history of malignant neoplasm of trachea, bronchus and lung: Secondary | ICD-10-CM | POA: Diagnosis not present

## 2023-01-01 DIAGNOSIS — H9190 Unspecified hearing loss, unspecified ear: Secondary | ICD-10-CM | POA: Diagnosis present

## 2023-01-01 DIAGNOSIS — J440 Chronic obstructive pulmonary disease with acute lower respiratory infection: Secondary | ICD-10-CM | POA: Diagnosis present

## 2023-01-01 DIAGNOSIS — Z6830 Body mass index (BMI) 30.0-30.9, adult: Secondary | ICD-10-CM | POA: Diagnosis not present

## 2023-01-01 DIAGNOSIS — Z8249 Family history of ischemic heart disease and other diseases of the circulatory system: Secondary | ICD-10-CM | POA: Diagnosis not present

## 2023-01-01 DIAGNOSIS — E7800 Pure hypercholesterolemia, unspecified: Secondary | ICD-10-CM | POA: Diagnosis present

## 2023-01-01 LAB — RESPIRATORY PANEL BY PCR

## 2023-01-01 LAB — GLUCOSE, CAPILLARY: Glucose-Capillary: 134 mg/dL — ABNORMAL HIGH (ref 70–99)

## 2023-01-01 LAB — URINALYSIS, ROUTINE W REFLEX MICROSCOPIC
Bilirubin Urine: NEGATIVE
Glucose, UA: NEGATIVE mg/dL
Hgb urine dipstick: NEGATIVE
Ketones, ur: 20 mg/dL — AB
Leukocytes,Ua: NEGATIVE
Nitrite: NEGATIVE
Protein, ur: NEGATIVE mg/dL
Specific Gravity, Urine: 1.012 (ref 1.005–1.030)
pH: 5 (ref 5.0–8.0)

## 2023-01-01 LAB — CBC
HCT: 42.5 % (ref 36.0–46.0)
Hemoglobin: 13.9 g/dL (ref 12.0–15.0)
MCH: 27.2 pg (ref 26.0–34.0)
MCHC: 32.7 g/dL (ref 30.0–36.0)
MCV: 83.2 fL (ref 80.0–100.0)
Platelets: 367 10*3/uL (ref 150–400)
RBC: 5.11 MIL/uL (ref 3.87–5.11)
RDW: 12.7 % (ref 11.5–15.5)
WBC: 11.5 10*3/uL — ABNORMAL HIGH (ref 4.0–10.5)
nRBC: 0 % (ref 0.0–0.2)

## 2023-01-01 LAB — BASIC METABOLIC PANEL
Anion gap: 14 (ref 5–15)
BUN: 21 mg/dL (ref 8–23)
CO2: 26 mmol/L (ref 22–32)
Calcium: 8.3 mg/dL — ABNORMAL LOW (ref 8.9–10.3)
Chloride: 97 mmol/L — ABNORMAL LOW (ref 98–111)
Creatinine, Ser: 0.63 mg/dL (ref 0.44–1.00)
GFR, Estimated: >60 mL/min (ref >60)
Glucose, Bld: 119 mg/dL — ABNORMAL HIGH (ref 70–99)
Potassium: 3.4 mmol/L — ABNORMAL LOW (ref 3.5–5.1)
Sodium: 137 mmol/L (ref 135–145)

## 2023-01-01 LAB — PROCALCITONIN: Procalcitonin: <0.1 ng/mL

## 2023-01-01 LAB — CREATININE, SERUM
Creatinine, Ser: 1.54 mg/dL — ABNORMAL HIGH (ref 0.44–1.00)
GFR, Estimated: 36 mL/min — ABNORMAL LOW (ref >60)

## 2023-01-01 LAB — STREP PNEUMONIAE URINARY ANTIGEN: Strep Pneumo Urinary Antigen: NEGATIVE

## 2023-01-01 MED ORDER — DM-GUAIFENESIN ER 30-600 MG PO TB12
1.0000 | ORAL_TABLET | Freq: Two times a day (BID) | ORAL | Status: DC
  Administered 2023-01-01 – 2023-01-06 (×12): 1 via ORAL
  Filled 2023-01-01 (×15): qty 1

## 2023-01-01 MED ORDER — UMECLIDINIUM-VILANTEROL 62.5-25 MCG/ACT IN AEPB
1.0000 | INHALATION_SPRAY | Freq: Every day | RESPIRATORY_TRACT | Status: DC
  Administered 2023-01-01: 1 via RESPIRATORY_TRACT
  Filled 2023-01-01: qty 14

## 2023-01-01 MED ORDER — METHYLPREDNISOLONE SODIUM SUCC 40 MG IJ SOLR
80.0000 mg | INTRAMUSCULAR | Status: DC
  Administered 2023-01-01: 80 mg via INTRAVENOUS
  Filled 2023-01-01: qty 2

## 2023-01-01 MED ORDER — METHYLPREDNISOLONE SODIUM SUCC 125 MG IJ SOLR
80.0000 mg | Freq: Two times a day (BID) | INTRAMUSCULAR | Status: DC
  Administered 2023-01-01: 80 mg via INTRAVENOUS
  Filled 2023-01-01: qty 2

## 2023-01-01 MED ORDER — HYDRALAZINE HCL 20 MG/ML IJ SOLN
10.0000 mg | INTRAMUSCULAR | Status: DC | PRN
  Administered 2023-01-01: 10 mg via INTRAVENOUS
  Filled 2023-01-01: qty 1

## 2023-01-01 MED ORDER — PREDNISONE 20 MG PO TABS: 40.0000 mg | ORAL_TABLET | Freq: Every day | ORAL | Status: DC

## 2023-01-01 MED ORDER — LEVALBUTEROL HCL 0.63 MG/3ML IN NEBU
0.6300 mg | INHALATION_SOLUTION | RESPIRATORY_TRACT | Status: DC
  Filled 2023-01-01: qty 3

## 2023-01-01 MED ORDER — METHYLPREDNISOLONE SODIUM SUCC 125 MG IJ SOLR: 120.0000 mg | Freq: Every day | INTRAMUSCULAR | Status: DC

## 2023-01-01 MED ORDER — BUDESON-GLYCOPYRROL-FORMOTEROL 160-9-4.8 MCG/ACT IN AERO: 2.0000 | INHALATION_SPRAY | Freq: Two times a day (BID) | RESPIRATORY_TRACT | Status: DC

## 2023-01-01 MED ORDER — SODIUM CHLORIDE 0.9 % IV SOLN
500.0000 mg | Freq: Every day | INTRAVENOUS | Status: DC
  Administered 2023-01-01 (×2): 500 mg via INTRAVENOUS
  Filled 2023-01-01 (×3): qty 5

## 2023-01-01 MED ORDER — REVEFENACIN 175 MCG/3ML IN SOLN
175.0000 ug | Freq: Every day | RESPIRATORY_TRACT | Status: DC
  Administered 2023-01-01 – 2023-01-06 (×6): 175 ug via RESPIRATORY_TRACT
  Filled 2023-01-01 (×6): qty 3

## 2023-01-01 MED ORDER — CHLORHEXIDINE GLUCONATE CLOTH 2 % EX PADS
6.0000 | MEDICATED_PAD | Freq: Every day | CUTANEOUS | Status: DC
  Administered 2023-01-02 – 2023-01-06 (×5): 6 via TOPICAL

## 2023-01-01 MED ORDER — IPRATROPIUM-ALBUTEROL 0.5-2.5 (3) MG/3ML IN SOLN
3.0000 mL | RESPIRATORY_TRACT | Status: DC
  Administered 2023-01-01 – 2023-01-02 (×7): 3 mL via RESPIRATORY_TRACT
  Filled 2023-01-01 (×7): qty 3

## 2023-01-01 MED ORDER — MELATONIN 3 MG PO TABS
3.0000 mg | ORAL_TABLET | Freq: Once | ORAL | Status: AC
  Administered 2023-01-01: 3 mg via ORAL
  Filled 2023-01-01: qty 1

## 2023-01-01 MED ORDER — LEVALBUTEROL HCL 0.63 MG/3ML IN NEBU
0.6300 mg | INHALATION_SOLUTION | RESPIRATORY_TRACT | Status: DC | PRN
  Administered 2023-01-01: 0.63 mg via RESPIRATORY_TRACT

## 2023-01-01 MED ORDER — FUROSEMIDE 10 MG/ML IJ SOLN
60.0000 mg | Freq: Once | INTRAMUSCULAR | Status: AC
  Administered 2023-01-01: 60 mg via INTRAVENOUS
  Filled 2023-01-01: qty 6

## 2023-01-01 MED ORDER — ARFORMOTEROL TARTRATE 15 MCG/2ML IN NEBU
15.0000 ug | INHALATION_SOLUTION | Freq: Two times a day (BID) | RESPIRATORY_TRACT | Status: DC
  Administered 2023-01-01 – 2023-01-06 (×10): 15 ug via RESPIRATORY_TRACT
  Filled 2023-01-01 (×10): qty 2

## 2023-01-01 MED ORDER — DEXMEDETOMIDINE HCL IN NACL 400 MCG/100ML IV SOLN
0.0000 ug/kg/h | INTRAVENOUS | Status: DC
Start: 2023-01-01 — End: 2023-01-02
  Filled 2023-01-01: qty 100

## 2023-01-01 MED ORDER — SODIUM CHLORIDE 0.9 % IV SOLN
1.0000 g | Freq: Every day | INTRAVENOUS | Status: AC
  Administered 2023-01-01 – 2023-01-05 (×6): 1 g via INTRAVENOUS
  Filled 2023-01-01 (×6): qty 10

## 2023-01-01 MED ORDER — ALPRAZOLAM 0.5 MG PO TABS
0.5000 mg | ORAL_TABLET | Freq: Two times a day (BID) | ORAL | Status: DC | PRN
  Administered 2023-01-01 (×2): 0.5 mg via ORAL
  Filled 2023-01-01 (×2): qty 1

## 2023-01-01 MED ORDER — BENZONATATE 100 MG PO CAPS
100.0000 mg | ORAL_CAPSULE | Freq: Three times a day (TID) | ORAL | Status: DC | PRN
  Administered 2023-01-01 – 2023-01-05 (×2): 100 mg via ORAL
  Filled 2023-01-01 (×3): qty 1

## 2023-01-01 MED ORDER — ORAL CARE MOUTH RINSE: 15.0000 mL | OROMUCOSAL | Status: DC | PRN

## 2023-01-01 MED ORDER — METHYLPREDNISOLONE SODIUM SUCC 40 MG IJ SOLR: 40.0000 mg | Freq: Two times a day (BID) | INTRAMUSCULAR | Status: DC

## 2023-01-01 MED ORDER — PREDNISONE 20 MG PO TABS
40.0000 mg | ORAL_TABLET | Freq: Every day | ORAL | Status: DC
Start: 2023-01-02 — End: 2023-01-01

## 2023-01-01 MED ORDER — ENOXAPARIN SODIUM 40 MG/0.4ML IJ SOSY
40.0000 mg | PREFILLED_SYRINGE | Freq: Every day | INTRAMUSCULAR | Status: DC
  Administered 2023-01-01 – 2023-01-05 (×5): 40 mg via SUBCUTANEOUS
  Filled 2023-01-01 (×5): qty 0.4

## 2023-01-01 MED ORDER — AMLODIPINE BESYLATE 5 MG PO TABS
5.0000 mg | ORAL_TABLET | Freq: Every day | ORAL | Status: DC
  Administered 2023-01-01 – 2023-01-06 (×6): 5 mg via ORAL
  Filled 2023-01-01 (×6): qty 1

## 2023-01-01 MED ORDER — CARMEX CLASSIC LIP BALM EX OINT
TOPICAL_OINTMENT | CUTANEOUS | Status: DC | PRN
  Filled 2023-01-01: qty 10

## 2023-01-01 NOTE — Progress Notes (Signed)
  Progress Note   Patient: [name] [name] LOV:[id number] DOB: [birth year] DOA: 12/31/2022     0 DOS: the patient was seen and examined on 01/01/2023   Brief hospital course: 72 year old female with past medical history of COPD gold standard 3, COPD, anxiety, migraine, HLD.  On 11/22 he presented to the ER with 1 day history of shortness of breath after being exposed to grandson with URI.  She was treated with IV steroids and antibiotics.  She improved in the ER.  Discharged home on Z-Pak and p.o. prednisone.  Since discharge she has shortness of breath that has not improved despite compliance with medication.  On the 26 she called her pulmonologist who added additional doses of p.o. prednisone.  Her shortness of breath persisted. Pt was admitted for COPD exacerbation  Assessment and Plan:  Acute exacerbation of COPD with metapneumovirus  Acute respiratory failure with hypoxia -Continued on IV solumedrol 80mg  -Pt continued on scheduled and PRN nebulizers  -Still with increased sob -Chart reviewed. Pt is known to Pulmonary service. Has known hx of severe emphysema and had prior spontaneous pneumothorax in 2016  -CXR this admit re-demonstrates severe emphysema -Consulted PCCM for assistance    Pure hypercholesterolemia  -Anxiety -will give trial of xanax as needed       Subjective: Complains of continued sob this AM  Physical Exam: Vitals:   01/01/23 1500 01/01/23 1530 01/01/23 1600 01/01/23 1700  BP:  (!) 163/92 139/74 (!) 146/79  Pulse:  (!) 104    Resp:  (!) 22 17 16   Temp: 97.9 F (36.6 C) 98.5 F (36.9 C)    TempSrc: Axillary Axillary    SpO2:  99% 100% 100%  Weight: 72.1 kg     Height:       General exam: Awake, laying in bed, in nad Respiratory system: Increased respiratory effort, decreased BS Cardiovascular system: regular rate, s1, s2 Gastrointestinal system: Soft, nondistended, positive BS Central nervous system: CN2-12 grossly intact, strength intact Extremities:  Perfused, no clubbing Skin: Normal skin turgor, no notable skin lesions seen Psychiatry: Mood normal // no visual hallucinations   Data Reviewed:  Labs reviewed: Na 137, K 3.4, Cr 0.63, WBC 11.5, Hgb 13.9, 367  Family Communication: Pt in room, family not at bedside  Disposition: Status is: Inpatient Remains inpatient appropriate because: severity of illness  Planned Discharge Destination: Home    Author: Rickey Barbara, MD 01/01/2023 5:43 PM  For on call review [email or web address].

## 2023-01-01 NOTE — Hospital Course (Signed)
72 year old female with past medical history of COPD gold standard 3, COPD, anxiety, migraine, HLD.  On 11/22 he presented to the ER with 1 day history of shortness of breath after being exposed to grandson with URI.  She was treated with IV steroids and antibiotics.  She improved in the ER.  Discharged home on Z-Pak and p.o. prednisone.  Since discharge she has shortness of breath that has not improved despite compliance with medication.  On the 26 she called her pulmonologist who added additional doses of p.o. prednisone.  Her shortness of breath persisted. Pt was admitted for COPD exacerbation

## 2023-01-01 NOTE — Progress Notes (Signed)
RT assisted with transport of this pt from 6E to 61M while on BiPAP. Pt tolerated well with SVS and no complications. 61M RT given report.

## 2023-01-01 NOTE — ED Notes (Signed)
ED TO INPATIENT HANDOFF REPORT  ED Nurse Name and Phone #: Merdith Boyd   S Name/Age/Gender [name] [name] 72 y.o. female Room/Bed: RESUSC/RESUSC  Code Status   Code Status: Full Code  Home/SNF/Other Home Patient oriented to: self, place, time, and situation Is this baseline? Yes   Triage Complete: Triage complete  Chief Complaint Acute respiratory failure with hypoxemia (HCC) [J96.01] Acute exacerbation of chronic obstructive pulmonary disease (COPD) (HCC) [J44.1]  Triage Note Pt BIB [facility] from home, pt c/o SOB. On room air pt was 90%; 98% on 2L. Pt also states she hasn't eaten or drank much in the last 48 hours. On arrival to the facility pt appears to have worsened labored breathing.    Allergies No Known Allergies  Level of Care/Admitting Diagnosis ED Disposition     ED Disposition  Admit   Condition  --   Comment  Hospital Area: [facility] [[id number]]  Level of Care: Progressive [102]  Admit to Progressive based on following criteria: RESPIRATORY PROBLEMS hypoxemic/hypercapnic respiratory failure that is responsive to NIPPV (BiPAP) or High Flow Nasal Cannula (6-80 lpm). Frequent assessment/intervention, no > Q2 hrs < Q4 hrs, to maintain oxygenation and pulmonary hygiene.  May admit patient to [facility] or [facility] if equivalent level of care is available:: Yes  Covid Evaluation: Confirmed COVID Negative  Diagnosis: Acute exacerbation of chronic obstructive pulmonary disease (COPD) (HCC) [161096]  Admitting Physician: Gery Pray [[id number]]  Attending Physician: Gery Pray [[id number]]  Certification:: I certify this patient will need inpatient services for at least 2 midnights  Expected Medical Readiness: 01/03/2023          B Medical/Surgery History Past Medical History:  Diagnosis Date   Anxiety    Complication of anesthesia    COPD (chronic obstructive pulmonary disease) (HCC)    COVID-19    X2   Diverticulitis    Dyspnea     Migraines    Tobacco use disorder 02/21/2021   Quit 02/2021 at age 34  - referred for annual screeing due 02/2021     Past Surgical History:  Procedure Laterality Date   CATARACT EXTRACTION Left    CESAREAN SECTION     STAPLING OF BLEBS Left 10/31/2014   Procedure: STAPLING OF BLEBS;  Surgeon: Alleen Borne, MD;  Location: [facility] OR;  Service: Thoracic;  Laterality: Left;   VIDEO ASSISTED THORACOSCOPY Left 10/31/2014   Procedure: LEFT VIDEO ASSISTED THORACOSCOPY;  Surgeon: Alleen Borne, MD;  Location: [facility] OR;  Service: Thoracic;  Laterality: Left;     A IV Location/Drains/Wounds Patient Lines/Drains/Airways Status     Active Line/Drains/Airways     Name Placement date Placement time Site Days   Peripheral IV 12/31/22 20 G Left;Posterior Hand 12/31/22  2016  Hand  1            Intake/Output Last 24 hours  Intake/Output Summary (Last 24 hours) at 01/01/2023 0056 Last data filed at 12/31/2022 2211 Gross per 24 hour  Intake 47.04 ml  Output --  Net 47.04 ml    Labs/Imaging Results for orders placed or performed during the hospital encounter of 12/31/22 (from the past 48 hour(s))  CBC with Differential/Platelet     Status: Abnormal   Collection Time: 12/31/22  8:17 PM  Result Value Ref Range   WBC 11.0 (H) 4.0 - 10.5 K/uL   RBC 5.03 3.87 - 5.11 MIL/uL   Hemoglobin 14.0 12.0 - 15.0 g/dL   HCT 04.5 40.9 - 81.1 %  MCV 83.5 80.0 - 100.0 fL   MCH 27.8 26.0 - 34.0 pg   MCHC 33.3 30.0 - 36.0 g/dL   RDW 25.9 56.3 - 87.5 %   Platelets 390 150 - 400 K/uL   nRBC 0.0 0.0 - 0.2 %   Neutrophils Relative % 83 %   Neutro Abs 9.1 (H) 1.7 - 7.7 K/uL   Lymphocytes Relative 11 %   Lymphs Abs 1.2 0.7 - 4.0 K/uL   Monocytes Relative 5 %   Monocytes Absolute 0.6 0.1 - 1.0 K/uL   Eosinophils Relative 0 %   Eosinophils Absolute 0.0 0.0 - 0.5 K/uL   Basophils Relative 0 %   Basophils Absolute 0.0 0.0 - 0.1 K/uL   Immature Granulocytes 1 %   Abs Immature Granulocytes 0.13 (H) 0.00 -  0.07 K/uL    Comment: Performed at [facility], [address], [place], [place] [address]  Comprehensive metabolic panel     Status: Abnormal   Collection Time: 12/31/22  8:17 PM  Result Value Ref Range   Sodium 138 135 - 145 mmol/L   Potassium 3.8 3.5 - 5.1 mmol/L   Chloride 103 98 - 111 mmol/L   CO2 25 22 - 32 mmol/L   Glucose, Bld 112 (H) 70 - 99 mg/dL    Comment: Glucose reference range applies only to samples taken after fasting for at least 8 hours.   BUN 19 8 - 23 mg/dL   Creatinine, Ser 9.51 0.44 - 1.00 mg/dL   Calcium 9.2 8.9 - 88.4 mg/dL   Total Protein 7.4 6.5 - 8.1 g/dL   Albumin 3.6 3.5 - 5.0 g/dL   AST 33 15 - 41 U/L   ALT 34 0 - 44 U/L   Alkaline Phosphatase 64 38 - 126 U/L   Total Bilirubin 0.9 <1.2 mg/dL   GFR, Estimated >16 >60 mL/min    Comment: (NOTE) Calculated using the CKD-EPI Creatinine Equation (2021)    Anion gap 10 5 - 15    Comment: Performed at [facility], [address] [place], [place] [address]  Brain natriuretic peptide     Status: None   Collection Time: 12/31/22  8:17 PM  Result Value Ref Range   B Natriuretic Peptide 56.7 0.0 - 100.0 pg/mL    Comment: Performed at [facility], [address], [place], [place] [address]  Resp panel by RT-PCR (RSV, Flu A&B, Covid) Anterior Nasal Swab     Status: None   Collection Time: 12/31/22  8:25 PM   Specimen: Anterior Nasal Swab  Result Value Ref Range   SARS Coronavirus 2 by RT PCR NEGATIVE NEGATIVE   Influenza A by PCR NEGATIVE NEGATIVE   Influenza B by PCR NEGATIVE NEGATIVE    Comment: (NOTE) The Xpert Xpress SARS-CoV-2/FLU/RSV plus assay is intended as an aid in the diagnosis of influenza from Nasopharyngeal swab specimens and should not be used as a sole basis for treatment. Nasal washings and aspirates are unacceptable for Xpert Xpress SARS-CoV-2/FLU/RSV testing.  Fact Sheet for Patients: BloggerCourse.com  Fact Sheet for Healthcare  Providers: SeriousBroker.it  This test is not yet approved or cleared by the [place] FDA and has been authorized for detection and/or diagnosis of SARS-CoV-2 by FDA under an Emergency Use Authorization (EUA). This EUA will remain in effect (meaning this test can be used) for the duration of the COVID-19 declaration under Section 564(b)(1) of the Act, 21 U.S.C. section 360bbb-3(b)(1), unless the authorization is  terminated or revoked.     Resp Syncytial Virus by PCR NEGATIVE NEGATIVE    Comment: (NOTE) Fact Sheet for Patients: BloggerCourse.com  Fact Sheet for Healthcare Providers: SeriousBroker.it  This test is not yet approved or cleared by the [place] FDA and has been authorized for detection and/or diagnosis of SARS-CoV-2 by FDA under an Emergency Use Authorization (EUA). This EUA will remain in effect (meaning this test can be used) for the duration of the COVID-19 declaration under Section 564(b)(1) of the Act, 21 U.S.C. section 360bbb-3(b)(1), unless the authorization is terminated or revoked.  Performed at [facility], [address] [place], [place] [address]   Troponin I (High Sensitivity)     Status: None   Collection Time: 12/31/22  8:28 PM  Result Value Ref Range   Troponin I (High Sensitivity) 11 <18 ng/L    Comment: (NOTE) Elevated high sensitivity troponin I (hsTnI) values and significant  changes across serial measurements may suggest ACS but many other  chronic and acute conditions are known to elevate hsTnI results.  Refer to the "Links" section for chest pain algorithms and additional  guidance. Performed at [facility], [address], [place], [place] [address]   I-Stat venous blood gas, ED     Status: Abnormal   Collection Time: 12/31/22  8:29 PM  Result Value Ref Range   pH, Ven 7.421 7.25 - 7.43   pCO2, Ven 42.3 (L) 44 - 60 mmHg   pO2, Ven 185  (H) 32 - 45 mmHg   Bicarbonate 27.5 20.0 - 28.0 mmol/L   TCO2 29 22 - 32 mmol/L   O2 Saturation 100 %   Acid-Base Excess 3.0 (H) 0.0 - 2.0 mmol/L   Sodium 139 135 - 145 mmol/L   Potassium 3.7 3.5 - 5.1 mmol/L   Calcium, Ion 1.13 (L) 1.15 - 1.40 mmol/L   HCT 42.0 36.0 - 46.0 %   Hemoglobin 14.3 12.0 - 15.0 g/dL   Sample type VENOUS    DG Chest Port 1 View  Result Date: 12/31/2022 CLINICAL DATA:  Shortness of breath. EXAM: PORTABLE CHEST 1 VIEW COMPARISON:  12/26/2022 FINDINGS: Slightly improved nonspecific interstitial and airspace opacities in the mid and lower lungs compared to 12/26/2022. Lucencies in the upper lungs suggestive of bullous change. Hyperinflation. Stable cardiomediastinal silhouette. Aortic atherosclerotic calcification. IMPRESSION: Slightly improved nonspecific interstitial and airspace opacities in the mid and lower lungs compared to 12/26/2022. Advanced emphysema Electronically Signed   By: Minerva Fester M.D.   On: 12/31/2022 21:26    Pending Labs Unresulted Labs (From admission, onward)     Start     Ordered   01/08/23 0500  Creatinine, serum  (enoxaparin (LOVENOX)    CrCl >/= 30 ml/min)  Weekly,   R     Comments: while on enoxaparin therapy    01/01/23 0017   01/01/23 0016  CBC  (enoxaparin (LOVENOX)    CrCl >/= 30 ml/min)  Once,   R       Comments: Baseline for enoxaparin therapy IF NOT ALREADY DRAWN.  Notify MD if PLT < 100 K.    01/01/23 0017   01/01/23 0016  Creatinine, serum  (enoxaparin (LOVENOX)    CrCl >/= 30 ml/min)  Once,   R       Comments: Baseline for enoxaparin therapy IF NOT ALREADY DRAWN.    01/01/23 0017   12/31/22 2018  Urinalysis, Routine w reflex microscopic -Urine, Clean Catch  Once,  URGENT       Question:  Specimen Source  Answer:  Urine, Clean Catch   12/31/22 2017            Vitals/Pain Today's Vitals   12/31/22 2015 12/31/22 2015 12/31/22 2200 12/31/22 2300  BP: (!) 165/93  (!) 161/85 (!) 156/86  Pulse: 100  99 (!) 107   Resp: (!) 24  (!) 22 (!) 22  Temp:    98.5 F (36.9 C)  TempSrc:    Oral  SpO2: 99%  99% 99%  PainSc:  0-No pain      Isolation Precautions No active isolations  Medications Medications  albuterol (PROVENTIL) (2.5 MG/3ML) 0.083% nebulizer solution 2.5 mg (2.5 mg Nebulization Given 12/31/22 2202)  ipratropium-albuterol (DUONEB) 0.5-2.5 (3) MG/3ML nebulizer solution 3 mL (3 mLs Nebulization Not Given 12/31/22 2106)  enoxaparin (LOVENOX) injection 40 mg (has no administration in time range)  umeclidinium-vilanterol (ANORO ELLIPTA) 62.5-25 MCG/ACT 1 puff (has no administration in time range)  cefTRIAXone (ROCEPHIN) 1 g in sodium chloride 0.9 % 100 mL IVPB (has no administration in time range)  azithromycin (ZITHROMAX) 500 mg in sodium chloride 0.9 % 250 mL IVPB (has no administration in time range)  methylPREDNISolone sodium succinate (SOLU-MEDROL) 125 mg/2 mL injection 120 mg (has no administration in time range)    Followed by  predniSONE (DELTASONE) tablet 40 mg (has no administration in time range)  dextromethorphan-guaiFENesin (MUCINEX DM) 30-600 MG per 12 hr tablet 1 tablet (has no administration in time range)  benzonatate (TESSALON) capsule 100 mg (has no administration in time range)  magnesium sulfate IVPB 2 g 50 mL (0 g Intravenous Stopped 12/31/22 2211)  methylPREDNISolone sodium succinate (SOLU-MEDROL) 125 mg/2 mL injection 125 mg (125 mg Intravenous Given 12/31/22 2023)  ipratropium-albuterol (DUONEB) 0.5-2.5 (3) MG/3ML nebulizer solution 3 mL (3 mLs Nebulization Given 12/31/22 2035)    Mobility walks     Focused Assessments    R Recommendations: See Admitting Provider Note  Report given to:   Additional Notes: no O2 at baseline, walks with no walker.

## 2023-01-01 NOTE — Progress Notes (Signed)
OT Cancellation Note  Patient Details Name: KATLYN LANDUYT MRN: [id number] DOB: 10/11/1950   Cancelled Treatment:    Reason Eval/Treat Not Completed: Patient declined, no reason specified;Other (comment) pt dyspneic on entry and politely asked for therapy to follow up tomorrow.    Lorre Munroe 01/01/2023, 10:53 AM

## 2023-01-01 NOTE — Progress Notes (Signed)
PT Cancellation Note  Patient Details Name: KENIKA NEUHAUSER MRN: [id number] DOB: 17-Feb-1950   Cancelled Treatment:    Reason Eval/Treat Not Completed: Patient not medically ready.  Pt is presently struggling to breathe.  Will hold today and see as able 11/29. 01/01/2023  Jacinto Halim., PT Acute Rehabilitation Services [phone]  (office)   Eliseo Gum Sara Selvidge 01/01/2023, 2:55 PM

## 2023-01-01 NOTE — Consult Note (Signed)
NAME:  [name] [name], MRN:  [id number], DOB:  September 10, 1950, LOS: 0 ADMISSION DATE:  12/31/2022, CONSULTATION DATE:  11/28 REFERRING MD:  Rhona Leavens- [facility] CHIEF COMPLAINT:  copd exacerbation   History of Present Illness:  [name] [name] is a 72 y/o woman with a history of COPD and an upper respiratory infection about 2 weeks ago with worsening of her breathing. After several days it felt like the cold moved into her chest and she had a wet cough. She has a history of COPD diagnosed in 2013. 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 She quit smoking in earlier 2024 per outpatient pulmonology notes. Since admission she has been requiring q2h nebs today and continues to have significant dyspnea. RR has been at bedside and a pulmonology consult was placed. Her son is at bedside assisting with translation. She can speak some English but is also hard of hearing.   Pertinent  Medical History  COPD, former tobacco abuse Blebs requiring L VATS in 2016  Significant Hospital Events: Including procedures, antibiotic start and stop dates in addition to other pertinent events   11/27 admitted 11/28 upgraded to ICU  Interim History / Subjective:    Objective   Blood pressure (!) 187/84, pulse (!) 106, temperature 98.2 F (36.8 C), temperature source Oral, resp. rate 20, height 5' 0.6" (1.539 m), weight 62.6 kg, SpO2 100%.        Intake/Output Summary (Last 24 hours) at 01/01/2023 1432 Last data filed at 01/01/2023 1135 Gross per 24 hour  Intake 527.04 ml  Output 700 ml  Net -172.96 ml   Filed Weights   01/01/23 0150  Weight: 62.6 kg    Examination: General: ill appearing woman sitting up at bedside resting on the bedside table HENT: [place]/AT, eyes anicteric Lungs: moving minimal air, tachypnea, no wheezing. 2-word conversational dyspnea. Cardiovascular: S1S2, RRR Abdomen: obese, nondistended Extremities: no cyanosis Neuro: Awake, alert, moving extremities. Comprehension appears intact when her son speaks to  her. Derm: warm, dry, no rashes  CXR personally reviewed> Increased lucency RUL not no pneumothorax. Increased interstitial markings bilateral lower lobes.   RVP + human metapneumovirus Pneumococcus negative Legionella pending  Resolved Hospital Problem list     Assessment & Plan:  Acute respiratory failure with hypoxia due to AE COPD Viral pneumonia due to human metapneumovirus  Possible acute pulmonary edema; not moving enough air to determine if she has rhales -STAT CXR completed to confirm no pneumothorax -BiPAP; may need anxiolytics -changing DPI to nebulized LAMA/LABA daily -duonebs q4h scheduled -increase steroids to 80mg  BID -move to ICU for closer monitoring; she would want intubation if required -lasix x1 dose -con't ceftriaxone and azithromycin; check procalcitonin since we have an alternative source causing decompensation  Possible AKI -repeat entire BMP rather than just creatinine -strict I/O -renally dose meds, avoid nephrotoxic meds  Son at bedside during consult & updated on care plan.  Best Practice (right click and "Reselect all SmartList Selections" daily)   Diet/type: NPO DVT prophylaxis: enoxaparin (LOVENOX) injection 40 mg Start: 01/01/23 1000   Pressure ulcer(s): not present on admission  GI prophylaxis: N/A Lines: N/A Foley:  N/A Code Status:  full code Last date of multidisciplinary goals of care discussion [11/28]  Labs   CBC: Recent Labs  Lab 12/26/22 1433 12/31/22 2017 12/31/22 2029 01/01/23 0333  WBC 6.6 11.0*  --  11.5*  NEUTROABS  --  9.1*  --   --   HGB 13.6 14.0 14.3 13.9  HCT 42.0 42.0 42.0 42.5  MCV 87.7 83.5  --  83.2  PLT 210 390  --  367    Basic Metabolic Panel: Recent Labs  Lab 12/26/22 1433 12/31/22 2017 12/31/22 2029 01/01/23 0333  NA 136 138 139  --   K 3.5 3.8 3.7  --   CL 106 103  --   --   CO2 20* 25  --   --   GLUCOSE 98 112*  --   --   BUN 17 19  --   --   CREATININE 0.65 0.68  --  1.54*  CALCIUM  8.8* 9.2  --   --    GFR: Estimated Creatinine Clearance: 28.1 mL/min (A) (by C-G formula based on SCr of 1.54 mg/dL (H)). Recent Labs  Lab 12/26/22 1433 12/31/22 2017 01/01/23 0333  WBC 6.6 11.0* 11.5*    Liver Function Tests: Recent Labs  Lab 12/31/22 2017  AST 33  ALT 34  ALKPHOS 64  BILITOT 0.9  PROT 7.4  ALBUMIN 3.6   No results for input(s): "LIPASE", "AMYLASE" in the last 168 hours. No results for input(s): "AMMONIA" in the last 168 hours.  ABG    Component Value Date/Time   HCO3 27.5 12/31/2022 2029   TCO2 29 12/31/2022 2029   O2SAT 100 12/31/2022 2029     Coagulation Profile: No results for input(s): "INR", "PROTIME" in the last 168 hours.  Cardiac Enzymes: No results for input(s): "CKTOTAL", "CKMB", "CKMBINDEX", "TROPONINI" in the last 168 hours.  HbA1C: Hgb A1c MFr Bld  Date/Time Value Ref Range Status  10/15/2021 01:37 PM 5.4 4.6 - 6.5 % Final    Comment:    Glycemic Control Guidelines for People with Diabetes:Non Diabetic:  <6%Goal of Therapy: <7%Additional Action Suggested:  >8%   12/18/2015 02:30 AM 5.6 4.8 - 5.6 % Final    Comment:    (NOTE)         Pre-diabetes: 5.7 - 6.4         Diabetes: >6.4         Glycemic control for adults with diabetes: <7.0     CBG: No results for input(s): "GLUCAP" in the last 168 hours.  Review of Systems:   Limited due to respiratory distress  Past Medical History:  She,  has a past medical history of Anxiety, Complication of anesthesia, COPD (chronic obstructive pulmonary disease) (HCC), COVID-19, Diverticulitis, Dyspnea, Migraines, and Tobacco use disorder (02/21/2021).   Surgical History:   Past Surgical History:  Procedure Laterality Date   CATARACT EXTRACTION Left    CESAREAN SECTION     STAPLING OF BLEBS Left 10/31/2014   Procedure: STAPLING OF BLEBS;  Surgeon: Alleen Borne, MD;  Location: [facility] OR;  Service: Thoracic;  Laterality: Left;   VIDEO ASSISTED THORACOSCOPY Left 10/31/2014    Procedure: LEFT VIDEO ASSISTED THORACOSCOPY;  Surgeon: Alleen Borne, MD;  Location: [facility] OR;  Service: Thoracic;  Laterality: Left;     Social History:   reports that she quit smoking about 16 months ago. Her smoking use included cigarettes. She has never used smokeless tobacco. She reports that she does not drink alcohol and does not use drugs.   Family History:  Her family history includes CAD in her brother; COPD in her sister; Cancer in her brother; Diabetes in her brother; Lung cancer in her father. There is no history of Colon cancer or Breast cancer.   Allergies No Known Allergies   Home Medications  Prior to Admission medications   Medication Sig Start  Date End Date Taking? Authorizing Provider  albuterol (PROVENTIL) (2.5 MG/3ML) 0.083% nebulizer solution Take 3 mLs (2.5 mg total) by nebulization every 4 (four) hours as needed for wheezing or shortness of breath. 12/30/22  Yes Nyoka Cowden, MD  albuterol (VENTOLIN HFA) 108 (90 Base) MCG/ACT inhaler INHALE 2 PUFFS BY MOUTH EVERY 4 HOURS AS NEEDED ONLY IF YOU CANT CANNOT CATCH YOUR BREATH 12/05/22  Yes Nyoka Cowden, MD  Budeson-Glycopyrrol-Formoterol (BREZTRI AEROSPHERE) 160-9-4.8 MCG/ACT AERO Inhale 2 puffs into the lungs in the morning and at bedtime. 10/01/22  Yes Nyoka Cowden, MD  Calcium Carbonate (CALCIUM 600 PO) Take 1 tablet by mouth daily.   Yes [provider]  calcium carbonate (TUMS - DOSED IN MG ELEMENTAL CALCIUM) 500 MG chewable tablet Chew 1 tablet by mouth 3 (three) times daily.   Yes [provider]  carboxymethylcellulose (REFRESH PLUS) 0.5 % SOLN 1 drop 3 (three) times daily as needed.   Yes [provider]  cetirizine (ZYRTEC) 10 MG tablet Take 1 tablet (10 mg total) by mouth daily. Patient taking differently: Take 10 mg by mouth daily as needed for allergies. 04/27/19  Yes Hoy Register, MD  CYANOCOBALAMIN PO Take 1 tablet by mouth daily.   Yes [provider]   Ergocalciferol 50 MCG (2000 UT) CAPS Take by mouth.   Yes [provider]  ibuprofen (ADVIL,MOTRIN) 200 MG tablet Take 400 mg by mouth every 6 (six) hours as needed for headache or moderate pain.   Yes [provider]  ipratropium-albuterol (DUONEB) 0.5-2.5 (3) MG/3ML SOLN INHALE 1 NEBULE VIA NEBULIZER EVERY 6 HOURS AS NEEDED 12/24/22  Yes Nyoka Cowden, MD  predniSONE (DELTASONE) 10 MG tablet Take 1 tablet (10 mg total) by mouth daily with breakfast. 12/30/22  Yes Nyoka Cowden, MD     Critical care time: 40 min    Steffanie Dunn, DO 01/01/23 2:55 PM [facility]  For contact information, see Amion. If no response to pager, please call PCCM consult pager. After hours, 7PM- 7AM, please call Elink.

## 2023-01-01 NOTE — Progress Notes (Signed)
BP 183/98 at 1203.  Spoke to Dr. Red Christians order for prn IV hydralazine received.

## 2023-01-01 NOTE — Progress Notes (Signed)
   01/01/23 0209  Assess: MEWS Score  Temp 97.8 F (36.6 C)  BP (!) 162/82  MAP (mmHg) 106  Pulse Rate (!) 106  ECG Heart Rate (!) 111  Resp (!) 24  Level of Consciousness Alert  SpO2 97 %  O2 Device Nasal Cannula  O2 Flow Rate (L/min) 4 L/min  Assess: MEWS Score  MEWS Temp 0  MEWS Systolic 0  MEWS Pulse 2  MEWS RR 1  MEWS LOC 0  MEWS Score 3  MEWS Score Color Yellow  Assess: if the MEWS score is Yellow or Red  Were vital signs accurate and taken at a resting state? Yes  Does the patient meet 2 or more of the SIRS criteria? Yes  Does the patient have a confirmed or suspected source of infection? No  MEWS guidelines implemented  Yes, yellow  Treat  MEWS Interventions Considered administering scheduled or prn medications/treatments as ordered  Take Vital Signs  Increase Vital Sign Frequency  Yellow: Q2hr x1, continue Q4hrs until patient remains green for 12hrs  Escalate  MEWS: Escalate Yellow: Discuss with charge nurse and consider notifying provider and/or RRT  Notify: Charge Nurse/RN  Name of Charge Nurse/RN Notified Dana, RN  Assess: SIRS CRITERIA  SIRS Temperature  0  SIRS Pulse 1  SIRS Respirations  1  SIRS WBC 0  SIRS Score Sum  2

## 2023-01-01 NOTE — Plan of Care (Signed)
  Problem: Health Behavior/Discharge Planning: Goal: Ability to manage health-related needs will improve Outcome: Progressing   Problem: Clinical Measurements: Goal: Respiratory complications will improve Outcome: Progressing Goal: Cardiovascular complication will be avoided Outcome: Progressing   Problem: Activity: Goal: Risk for activity intolerance will decrease Outcome: Progressing   Problem: Safety: Goal: Ability to remain free from injury will improve Outcome: Progressing

## 2023-01-01 NOTE — Significant Event (Signed)
Rapid Response Event Note   Reason for Call :  Shortness of breath, increased work of breathing  Initial Focused Assessment:  Patient is alert sitting on the side of the bed.  She is using accessory muscles to breath.  Lung sounds are diminished through out. Heart tones regular Congested cough   Interventions:  CCM consult (Whitney NP and Dr Chestine Spore at bedside) Bipap per RT New PIV Right FA 20ga  She looks much better on bipap, she is able to lay back and relax  Transfer to ICU (3M07)  Plan of Care:     Event Summary:   MD Notified: Rhona Leavens,  CCM consulted Call Time:  Arrival Time: End Time:  Marcellina Millin, RN

## 2023-01-01 NOTE — Progress Notes (Addendum)
Spoke to Dr Rhona Leavens about patients breathing.  Patient diminished bilaterally and short of breath on assessment.  Stated he would see patient soon.

## 2023-01-01 NOTE — Progress Notes (Signed)
Patient complaining of increased shortness of breath.  Spoke to [employer] (RT) she recommended PCCM consult.  Rapid Resonse notified and Dr. Rhona Leavens notified.    Patient's O2 sats in >94% on 3L.

## 2023-01-01 NOTE — Progress Notes (Incomplete)
Initial Nutrition Assessment  DOCUMENTATION CODES:      INTERVENTION:  ***   NUTRITION DIAGNOSIS:     related to   as evidenced by  .  ***  GOAL:      ***  MONITOR:      REASON FOR ASSESSMENT:   Consult Assessment of nutrition requirement/status  ASSESSMENT:  72 y/o female presented to the ED with one day history of shortness of breath after being exposed to grandson with URI.   PMH: COPD, anxiety, migraine, HLD, anxiety, COVID-19 x2, diverticulitis, dyspnea, tobacco use disorder.  No recorded intakes yet.   Per EMR, weight loss of 13% x 2.5 months. Edema non-pitting to RLE, LLE.  Medications reviewed and include solumedrol.  Labs reviewed  NUTRITION - FOCUSED PHYSICAL EXAM:  {RD Focused Exam List:21252}  Diet Order:   Diet Order             Diet Heart Room service appropriate? Yes; Fluid consistency: Thin  Diet effective now                   EDUCATION NEEDS:      Skin:  Skin Assessment: Reviewed RN Assessment  Last BM:  01/01/23  Height:   Ht Readings from Last 1 Encounters:  01/01/23 5' 0.6" (1.539 m)    Weight:   Wt Readings from Last 1 Encounters:  01/01/23 62.6 kg    Ideal Body Weight:  48.9 kg  BMI:  Body mass index is 26.42 kg/m.  Estimated Nutritional Needs:   Kcal:  1500-1750 kcal/day  Protein:  64-73 gm/day  Fluid:  1500-1750 mL/day    Alvino Chapel, RDLD Clinical Dietitian See AMION for contact information

## 2023-01-02 ENCOUNTER — Inpatient Hospital Stay (HOSPITAL_COMMUNITY): Payer: 59

## 2023-01-02 DIAGNOSIS — I5031 Acute diastolic (congestive) heart failure: Secondary | ICD-10-CM | POA: Diagnosis not present

## 2023-01-02 DIAGNOSIS — J9601 Acute respiratory failure with hypoxia: Secondary | ICD-10-CM | POA: Diagnosis not present

## 2023-01-02 DIAGNOSIS — J441 Chronic obstructive pulmonary disease with (acute) exacerbation: Secondary | ICD-10-CM | POA: Diagnosis not present

## 2023-01-02 LAB — BASIC METABOLIC PANEL
Anion gap: 11 (ref 5–15)
BUN: 22 mg/dL (ref 8–23)
CO2: 28 mmol/L (ref 22–32)
Calcium: 8.4 mg/dL — ABNORMAL LOW (ref 8.9–10.3)
Chloride: 100 mmol/L (ref 98–111)
Creatinine, Ser: 0.67 mg/dL (ref 0.44–1.00)
GFR, Estimated: >60 mL/min (ref >60)
Glucose, Bld: 89 mg/dL (ref 70–99)
Potassium: 3.4 mmol/L — ABNORMAL LOW (ref 3.5–5.1)
Sodium: 139 mmol/L (ref 135–145)

## 2023-01-02 LAB — ECHOCARDIOGRAM COMPLETE
AR max vel: 2.05 cm2
AV Area VTI: 2.1 cm2
AV Area mean vel: 1.93 cm2
AV Mean grad: 6 mm[Hg]
AV Peak grad: 12.7 mm[Hg]
Ao pk vel: 1.78 m/s
Area-P 1/2: 3.83 cm2
Height: 60.6 in
S' Lateral: 1.65 cm
Weight: 2543.23 [oz_av]

## 2023-01-02 LAB — MRSA NEXT GEN BY PCR, NASAL: MRSA by PCR Next Gen: NOT DETECTED

## 2023-01-02 MED ORDER — IPRATROPIUM-ALBUTEROL 0.5-2.5 (3) MG/3ML IN SOLN
3.0000 mL | RESPIRATORY_TRACT | Status: DC
  Administered 2023-01-03 – 2023-01-06 (×18): 3 mL via RESPIRATORY_TRACT
  Filled 2023-01-02 (×18): qty 3

## 2023-01-02 MED ORDER — CALCIUM GLUCONATE-NACL 2-0.675 GM/100ML-% IV SOLN
2.0000 g | Freq: Once | INTRAVENOUS | Status: AC
  Administered 2023-01-02: 2000 mg via INTRAVENOUS
  Filled 2023-01-02: qty 100

## 2023-01-02 MED ORDER — AZITHROMYCIN 500 MG PO TABS
250.0000 mg | ORAL_TABLET | Freq: Every day | ORAL | Status: AC
  Administered 2023-01-02 – 2023-01-05 (×4): 250 mg via ORAL
  Filled 2023-01-02 (×5): qty 1

## 2023-01-02 MED ORDER — POTASSIUM CHLORIDE CRYS ER 20 MEQ PO TBCR
40.0000 meq | EXTENDED_RELEASE_TABLET | Freq: Once | ORAL | Status: AC
  Administered 2023-01-02: 40 meq via ORAL
  Filled 2023-01-02: qty 2

## 2023-01-02 MED ORDER — METHYLPREDNISOLONE SODIUM SUCC 40 MG IJ SOLR
40.0000 mg | Freq: Two times a day (BID) | INTRAMUSCULAR | Status: DC
  Administered 2023-01-02 – 2023-01-04 (×5): 40 mg via INTRAVENOUS
  Filled 2023-01-02 (×5): qty 1

## 2023-01-02 MED ORDER — PERFLUTREN LIPID MICROSPHERE
1.0000 mL | INTRAVENOUS | Status: AC | PRN
  Administered 2023-01-02: 3 mL via INTRAVENOUS

## 2023-01-02 MED ORDER — POTASSIUM CHLORIDE 20 MEQ PO PACK
40.0000 meq | PACK | Freq: Once | ORAL | Status: AC
  Administered 2023-01-02: 40 meq
  Filled 2023-01-02: qty 2

## 2023-01-02 MED ORDER — FUROSEMIDE 10 MG/ML IJ SOLN
60.0000 mg | Freq: Once | INTRAMUSCULAR | Status: AC
  Administered 2023-01-02: 60 mg via INTRAVENOUS
  Filled 2023-01-02: qty 6

## 2023-01-02 MED ORDER — ENSURE ENLIVE PO LIQD
237.0000 mL | Freq: Three times a day (TID) | ORAL | Status: DC
  Administered 2023-01-02 – 2023-01-06 (×10): 237 mL via ORAL

## 2023-01-02 NOTE — Progress Notes (Signed)
Initial Nutrition Assessment  DOCUMENTATION CODES:   Non-severe (moderate) malnutrition in context of acute illness/injury  INTERVENTION:   Modify diet to Dysphagia 3 (easy to chew) due to significant dyspnea with minimal exertion, fatigue. Eating only liquids, soft foods currently  Ensure Enlive po TID, each supplement provides 350 kcal and 20 grams of protein.  If po intake does not improve, recommend considering Cortrak placement with initiation of supplemental nutrition   NUTRITION DIAGNOSIS:   Moderate Malnutrition related to acute illness as evidenced by energy intake < 75% for > 7 days, mild muscle depletion.  GOAL:   Patient will meet greater than or equal to 90% of their needs  MONITOR:   PO intake, Supplement acceptance, Labs, Weight trends  REASON FOR ASSESSMENT:   Consult Assessment of nutrition requirement/status  ASSESSMENT:   72 y/o female presented to the ED with one day history of shortness of breath after being exposed to grandson with URI.   PMH: COPD, anxiety, migraine, HLD, anxiety, COVID-19 x2, diverticulitis, dyspnea, tobacco use disorder.  11/27 Admitted with acute respiratory failure 11/28 Transfer to ICU,  +BiPAp  Currently off BiPap, on [place]. Pt sitting up in chair on visit, attempting to eat some lunch. Pt gety very SOB with minimal exertion. Pt looks worn out.   Pt reports her illness started 11/16; pt reports eating minimally since then except for soup made by her daughter. Today, pt ate part of mashed potatoes and some soup. She indicates this is the most she has eaten  Tongue with white patches; pt denies pain but swished her mouth well with water and white remained. Discussed with RN, ?thrush.   Current wt 72.1 kg; pt reports she does not know her UBW, does not weigh herself but indicates she thinks she may have lost weight. Per weight encounters, no recent wt loss but weights have fluctuated up and down over the past 1-2  years  Labs:potassium 3.4 (L), BUN/Creatinine wdl, no magnesium or phosphorus Meds: KCl, solumedrol  NUTRITION - FOCUSED PHYSICAL EXAM:  Flowsheet Row Most Recent Value  Orbital Region No depletion  Upper Arm Region No depletion  Thoracic and Lumbar Region No depletion  Buccal Region No depletion  Temple Region Mild depletion  Clavicle Bone Region Mild depletion  Clavicle and Acromion Bone Region Mild depletion  Scapular Bone Region Mild depletion  Dorsal Hand Mild depletion  Patellar Region Unable to assess  Anterior Thigh Region Unable to assess  Posterior Calf Region Unable to assess  Edema (RD Assessment) Mild       Diet Order:  HEART HEALTHY   EDUCATION NEEDS:   Education needs have been addressed  Skin:  Skin Assessment: Reviewed RN Assessment  Last BM:  01/01/23  Height:   Ht Readings from Last 1 Encounters:  01/01/23 5' 0.6" (1.539 m)    Weight:   Wt Readings from Last 1 Encounters:  01/01/23 72.1 kg    Ideal Body Weight:  48.9 kg  BMI:  Body mass index is 30.43 kg/m.  Estimated Nutritional Needs:   Kcal:  1500-1750 kcal/day  Protein:  64-73 gm/day  Fluid:  1500-1750 mL/day    Romelle Starcher MS, RDN, LDN, CNSC Registered Dietitian 3 Clinical Nutrition RD Pager and On-Call Pager Number Located in [place]

## 2023-01-02 NOTE — Progress Notes (Signed)
  Echocardiogram 2D Echocardiogram has been performed.  [name] [name] 01/02/2023, 11:20 AM

## 2023-01-02 NOTE — Progress Notes (Signed)
Pt taken off bipap at this time and placed on 4L nasal cannula without complication.

## 2023-01-02 NOTE — Evaluation (Signed)
Physical Therapy Evaluation Patient Details Name: [name] [name] MRN: [id number] DOB: May 12, 1950 Today's Date: 01/02/2023  History of Present Illness  Pt is a 72 y/o female admitted 11/27 for acute respiratory failure with hypoemia in setting of COPD exacerbation. On 11/28, pt with increasing difficulty breathing requiring BiPAP and transfer to ICU. Recent ED visit 11/22 due to SOB in setting of URI; treated with IV steroids and antibiotics then discharged home. PMH: COPD, anxiety, migraine, HLD   Clinical Impression  Pt admitted with above diagnosis. Active and independent PTA. Currently limited by generalized weakness and diminished respiratory capacity. Pt reports notable improvement since admission. Ambulates 150 feet with RW this morning with supervision for safety and +2 for line/lead management. SpO2 maintained 93-95% on 2L supplemental O2 throughout session. Moderate dyspnea, improves with seated rest.  Anticipate further improvement in functional independence as respiratory status improves. Will update recs as indicated. Pt currently with functional limitations due to the deficits listed below (see PT Problem List). Pt will benefit from acute skilled PT to increase their independence and safety with mobility to allow discharge.           If plan is discharge home, recommend the following: A little help with walking and/or transfers;Assistance with cooking/housework;Assist for transportation;Help with stairs or ramp for entrance   Can travel by private vehicle        Equipment Recommendations Other (comment) (May benefit from RW; Will update if needed next visit)  Recommendations for Other Services       Functional Status Assessment Patient has had a recent decline in their functional status and demonstrates the ability to make significant improvements in function in a reasonable and predictable amount of time.     Precautions / Restrictions Precautions Precautions: Fall;Other  (comment) Precaution Comments: monitor O2 Restrictions Weight Bearing Restrictions: No      Mobility  Bed Mobility               General bed mobility comments: EOB on entry    Transfers Overall transfer level: Needs assistance Equipment used: Rolling walker (2 wheels) Transfers: Sit to/from Stand Sit to Stand: Supervision           General transfer comment: Stands from recliner without physical assist, slow to rise but stable with light RW use for support.    Ambulation/Gait Ambulation/Gait assistance: Supervision, +2 safety/equipment Gait Distance (Feet): 150 Feet Assistive device: Rolling walker (2 wheels) Gait Pattern/deviations: Step-through pattern, Decreased stride length Gait velocity: dec Gait velocity interpretation: <1.8 ft/sec, indicate of risk for recurrent falls   General Gait Details: Reduced pace but stable with RW for support. Educated on safe use with AD, pacing, energy conservation and pursed lip breathing techniques. SpO2 maintained 93-96% throughout. HR 110s. No buckling or LOB with RW for support. +2 for safety/equipment use only.  Stairs            Wheelchair Mobility     Tilt Bed    Modified Rankin (Stroke Patients Only)       Balance Overall balance assessment: Needs assistance Sitting-balance support: Feet supported, No upper extremity supported Sitting balance-Leahy Scale: Good     Standing balance support: No upper extremity supported, During functional activity, Bilateral upper extremity supported Standing balance-Leahy Scale: Fair                               Pertinent Vitals/Pain Pain Assessment Pain Assessment: No/denies pain  Home Living Family/patient expects to be discharged to:: Private residence Living Arrangements: Alone Available Help at Discharge: Family;Available PRN/intermittently Type of Home: Mobile home Home Access: Stairs to enter Entrance Stairs-Rails: Tax inspector of Steps: 4   Home Layout: One level Home Equipment: Shower seat      Prior Function Prior Level of Function : Independent/Modified Independent;Driving             Mobility Comments: no AD, has a treadmill at home that she uses on good days ADLs Comments: Indep with ADLs, IADLs but has good days and bad days with breathing. 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     Extremity/Trunk Assessment   Upper Extremity Assessment Upper Extremity Assessment: Defer to OT evaluation    Lower Extremity Assessment Lower Extremity Assessment: Generalized weakness    Cervical / Trunk Assessment Cervical / Trunk Assessment: Normal  Communication   Communication Communication: Other (comment) (interpreter used during session per pt request)  Cognition Arousal: Alert Behavior During Therapy: WFL for tasks assessed/performed Overall Cognitive Status: Within Functional Limits for tasks assessed                                          General Comments General comments (skin integrity, edema, etc.): SpO2 93% + while ambulating on 2L supplemental O2.    Exercises     Assessment/Plan    PT Assessment Patient needs continued PT services  PT Problem List Decreased strength;Decreased activity tolerance;Decreased balance;Decreased mobility;Decreased knowledge of use of DME;Cardiopulmonary status limiting activity       PT Treatment Interventions DME instruction;Gait training;Stair training;Functional mobility training;Therapeutic activities;Therapeutic exercise;Neuromuscular re-education;Balance training;Patient/family education    PT Goals (Current goals can be found in the Care Plan section)  Acute Rehab PT Goals Patient Stated Goal: get well, feel strong PT Goal Formulation: With patient Time For Goal Achievement: 01/16/23 Potential to Achieve Goals: Good    Frequency Min 1X/week     Co-evaluation PT/OT/SLP  Co-Evaluation/Treatment: Yes Reason for Co-Treatment: Other (comment);For patient/therapist safety;To address functional/ADL transfers (impaired activity tolerance, recent tenuous respiratory status) PT goals addressed during session: Mobility/safety with mobility;Balance;Proper use of DME OT goals addressed during session: ADL's and self-care;Proper use of Adaptive equipment and DME       AM-PAC PT "6 Clicks" Mobility  Outcome Measure Help needed turning from your back to your side while in a flat bed without using bedrails?: None Help needed moving from lying on your back to sitting on the side of a flat bed without using bedrails?: A Little Help needed moving to and from a bed to a chair (including a wheelchair)?: A Little Help needed standing up from a chair using your arms (e.g., wheelchair or bedside chair)?: A Little Help needed to walk in hospital room?: A Little Help needed climbing 3-5 steps with a railing? : A Little 6 Click Score: 19    End of Session Equipment Utilized During Treatment: Gait belt;Oxygen Activity Tolerance: Patient tolerated treatment well Patient left: in chair;with call bell/phone within reach Nurse Communication: Mobility status PT Visit Diagnosis: Other abnormalities of gait and mobility (R26.89);Muscle weakness (generalized) (M62.81)    Time: 1610-9604 PT Time Calculation (min) (ACUTE ONLY): 29 min   Charges:   PT Evaluation $PT Eval Low Complexity: 1 Low   PT General Charges $$ ACUTE PT VISIT: 1 Visit  Kathlyn [place], PT, DPT [facility]  Rehabilitation Services Physical Therapist Office: [phone] Website: [email or web address]   Berton Mount 01/02/2023, 12:33 PM

## 2023-01-02 NOTE — Evaluation (Signed)
Occupational Therapy Evaluation Patient Details Name: [name] [name] MRN: [id number] DOB: October 26, 1950 Today's Date: 01/02/2023   History of Present Illness Pt is a 72 y/o female admitted 11/27 for acute respiratory failure with hypoemia in setting of COPD exacerbation. On 11/28, pt with increasing difficulty breathing requiring BiPAP and transfer to ICU. Recent ED visit 11/22 due to SOB in setting of URI; treated with IV steroids and antibiotics then discharged home. PMH: COPD, anxiety, migraine, HLD   Clinical Impression   PTA, pt reports living alone and typically Independent in ADLs, IADLs and mobility without AD. Pt presents with deficits in cardiopulmonary tolerance and strength though feeling significantly better than yesterday. Pt able to manage ADLs, transfers and mobility with RW without physical assistance. Provided energy conservation handout in Spanish for pt to review in order to maximize independence/safety at home. Anticipate no OT needs at DC.  HR to 110s with activity SpO2 93-95% on 2 L with activity       If plan is discharge home, recommend the following: Other (comment) (PRN)    Functional Status Assessment  Patient has had a recent decline in their functional status and demonstrates the ability to make significant improvements in function in a reasonable and predictable amount of time.  Equipment Recommendations  None recommended by OT    Recommendations for Other Services       Precautions / Restrictions Precautions Precautions: Fall;Other (comment) Precaution Comments: monitor O2 Restrictions Weight Bearing Restrictions: No      Mobility Bed Mobility               General bed mobility comments: EOB on entry    Transfers Overall transfer level: Needs assistance Equipment used: None, Rolling walker (2 wheels) Transfers: Sit to/from Stand, Bed to chair/wheelchair/BSC Sit to Stand: Supervision     Step pivot transfers: Supervision      General transfer comment: Able to stand from bedside and step to chair without physical assist as echo finished at bedside. Able to stand from recliner with RW (opted for RW due to feeling weak)      Balance Overall balance assessment: Needs assistance Sitting-balance support: Feet supported, No upper extremity supported Sitting balance-Leahy Scale: Good     Standing balance support: No upper extremity supported, During functional activity, Bilateral upper extremity supported Standing balance-Leahy Scale: Fair                             ADL either performed or assessed with clinical judgement   ADL Overall ADL's : Needs assistance/impaired Eating/Feeding: Independent   Grooming: Supervision/safety;Standing   Upper Body Bathing: Set up   Lower Body Bathing: Supervison/ safety   Upper Body Dressing : Set up   Lower Body Dressing: Supervision/safety   Toilet Transfer: Supervision/safety;Ambulation   Toileting- Clothing Manipulation and Hygiene: Supervision/safety         General ADL Comments: moving well though limited by increased SOB with activity. provided energy conservation handout in spanish for pt to review     Vision Ability to See in Adequate Light: 0 Adequate Patient Visual Report: No change from baseline Vision Assessment?: No apparent visual deficits     Perception         Praxis         Pertinent Vitals/Pain Pain Assessment Pain Assessment: No/denies pain     Extremity/Trunk Assessment Upper Extremity Assessment Upper Extremity Assessment: Overall WFL for tasks assessed;Right hand dominant   Lower  Extremity Assessment Lower Extremity Assessment: Defer to PT evaluation   Cervical / Trunk Assessment Cervical / Trunk Assessment: Normal   Communication Communication Communication: Other (comment) (interpreter used during session per pt request)   Cognition Arousal: Alert Behavior During Therapy: WFL for tasks  assessed/performed Overall Cognitive Status: Within Functional Limits for tasks assessed                                       General Comments       Exercises     Shoulder Instructions      Home Living Family/patient expects to be discharged to:: Private residence Living Arrangements: Alone Available Help at Discharge: Family;Available PRN/intermittently Type of Home: Mobile home Home Access: Stairs to enter Entrance Stairs-Number of Steps: 4 Entrance Stairs-Rails: Left;Right Home Layout: One level     Bathroom Shower/Tub: Tub/shower unit;Walk-in shower   Bathroom Toilet: Standard     Home Equipment: Shower seat          Prior Functioning/Environment Prior Level of Function : Independent/Modified Independent;Driving             Mobility Comments: no AD, has a treadmill at home that she uses on good days ADLs Comments: Indep with ADLs, IADLs but has good days and bad days with breathing. 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, get back to valet work OT Goal Formulation: With patient Time For Goal Achievement: 01/16/23 Potential to Achieve Goals: Good  OT Frequency: Min 1X/week    Co-evaluation PT/OT/SLP Co-Evaluation/Treatment: Yes Reason for Co-Treatment: Other (comment) (impaired activity tolerance, recent tenuous respiratory status)   OT goals addressed during session: ADL's and self-care;Proper use of Adaptive equipment and DME      AM-PAC OT "6 Clicks" Daily Activity     Outcome Measure Help from another person eating  meals?: None Help from another person taking care of personal grooming?: A Little Help from another person toileting, which includes using toliet, bedpan, or urinal?: A Little Help from another person bathing (including washing, rinsing, drying)?: A Little Help from another person to put on and taking off regular upper body clothing?: A Little Help from another person to put on and taking off regular lower body clothing?: A Little 6 Click Score: 19   End of Session Equipment Utilized During Treatment: Rolling walker (2 wheels);Oxygen Nurse Communication: Mobility status  Activity Tolerance: Patient tolerated treatment well Patient left: in chair  OT Visit Diagnosis: Other (comment) (decreased cardiopulmonary tolerance)                Time: 6962-9528 OT Time Calculation (min): 29 min Charges:  OT General Charges $OT Visit: 1 Visit OT Evaluation $OT Eval Moderate Complexity: 1 Mod  Bradd Canary, OTR/L Acute Rehab Services Office: [phone]   Lorre Munroe 01/02/2023, 11:54 AM

## 2023-01-02 NOTE — Progress Notes (Signed)
NAME:  [name] [name], MRN:  [id number], DOB:  Jul 24, 1950, LOS: 1 ADMISSION DATE:  12/31/2022, CONSULTATION DATE:  11/28 REFERRING MD:  Rhona Leavens- [facility] CHIEF COMPLAINT:  copd exacerbation   History of Present Illness:  [name] [name] is a 72 y/o woman with a history of COPD and an upper respiratory infection about 2 weeks ago with worsening of her breathing. After several days it felt like the cold moved into her chest and she had a wet cough. She has a history of COPD diagnosed in 2013. 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 She quit smoking in earlier 2024 per outpatient pulmonology notes. Since admission she has been requiring q2h nebs today and continues to have significant dyspnea. RR has been at bedside and a pulmonology consult was placed. Her son is at bedside assisting with translation. She can speak some English but is also hard of hearing.   Pertinent  Medical History  COPD, former tobacco abuse Blebs requiring L VATS in 2016  Significant Hospital Events: Including procedures, antibiotic start and stop dates in addition to other pertinent events   11/27 admitted 11/28 upgraded to ICU  Interim History / Subjective:  Patient stated she is feeling better, continue to have cough and shortness of breath, unable to bring up phlegm She remained afebrile  Objective   Blood pressure (!) 101/55, pulse 84, temperature 98.9 F (37.2 C), temperature source Axillary, resp. rate 13, height 5' 0.6" (1.539 m), weight 72.1 kg, SpO2 95%.    Vent Mode: PCV;BIPAP FiO2 (%):  [30 %-60 %] 30 % Set Rate:  [15 bmp] 15 bmp PEEP:  [5 cmH20] 5 cmH20   Intake/Output Summary (Last 24 hours) at 01/02/2023 9562 Last data filed at 01/02/2023 0100 Gross per 24 hour  Intake 700 ml  Output 1550 ml  Net -850 ml   Filed Weights   01/01/23 0150 01/01/23 1500  Weight: 62.6 kg 72.1 kg    Examination: General: Acute on chronically ill-appearing obese female, sitting on recliner HEENT: [place]/AT, eyes anicteric.  moist mucus  membranes Neuro: Alert, awake following commands Chest: Bilateral expiratory wheezes, no rhonchi or crackles Heart: Regular rate and rhythm, no murmurs or gallops Abdomen: Soft, nontender, nondistended, bowel sounds present Skin: No rash  RVP + human metapneumovirus Pneumococcus negative Legionella pending  Resolved Hospital Problem list     Assessment & Plan:  Acute respiratory failure with hypoxia due acute COPD exacerbation Viral pneumonia due to human metapneumovirus  Possible acute pulmonary edema; not moving enough air to determine if she has rhales Patient came off of BiPAP Currently on 2 L nasal cannula oxygen Continue DuoNeb every 4 hours scheduled Continue DPI to nebulized LAMA/LABA daily Decrease steroid to 40 mg twice daily Trial of Lasix Will get echocardiogram to rule out CHF con't ceftriaxone and azithromycin; check procalcitonin since we have an alternative source causing decompensation  AKI was ruled out  Obesity Diet and exercise counseling provided  Best Practice (right click and "Reselect all SmartList Selections" daily)   Diet/type: Regular consistency DVT prophylaxis: enoxaparin (LOVENOX) injection 40 mg Start: 01/01/23 1000   Pressure ulcer(s): not present on admission  GI prophylaxis: N/A Lines: N/A Foley:  N/A Code Status:  full code Last date of multidisciplinary goals of care discussion [11/28]  Labs   CBC: Recent Labs  Lab 12/26/22 1433 12/31/22 2017 12/31/22 2029 01/01/23 0333  WBC 6.6 11.0*  --  11.5*  NEUTROABS  --  9.1*  --   --   HGB 13.6 14.0  14.3 13.9  HCT 42.0 42.0 42.0 42.5  MCV 87.7 83.5  --  83.2  PLT 210 390  --  367    Basic Metabolic Panel: Recent Labs  Lab 12/26/22 1433 12/31/22 2017 12/31/22 2029 01/01/23 0333 01/01/23 1617 01/02/23 0302  NA 136 138 139  --  137 139  K 3.5 3.8 3.7  --  3.4* 3.4*  CL 106 103  --   --  97* 100  CO2 20* 25  --   --  26 28  GLUCOSE 98 112*  --   --  119* 89  BUN 17 19   --   --  21 22  CREATININE 0.65 0.68  --  1.54* 0.63 0.67  CALCIUM 8.8* 9.2  --   --  8.3* 8.4*   GFR: Estimated Creatinine Clearance: 58 mL/min (by C-G formula based on SCr of 0.67 mg/dL). Recent Labs  Lab 12/26/22 1433 12/31/22 2017 01/01/23 0333 01/01/23 1617  PROCALCITON  --   --   --  <0.10  WBC 6.6 11.0* 11.5*  --     Liver Function Tests: Recent Labs  Lab 12/31/22 2017  AST 33  ALT 34  ALKPHOS 64  BILITOT 0.9  PROT 7.4  ALBUMIN 3.6   No results for input(s): "LIPASE", "AMYLASE" in the last 168 hours. No results for input(s): "AMMONIA" in the last 168 hours.  ABG    Component Value Date/Time   HCO3 27.5 12/31/2022 2029   TCO2 29 12/31/2022 2029   O2SAT 100 12/31/2022 2029     Coagulation Profile: No results for input(s): "INR", "PROTIME" in the last 168 hours.  Cardiac Enzymes: No results for input(s): "CKTOTAL", "CKMB", "CKMBINDEX", "TROPONINI" in the last 168 hours.  HbA1C: Hgb A1c MFr Bld  Date/Time Value Ref Range Status  10/15/2021 01:37 PM 5.4 4.6 - 6.5 % Final    Comment:    Glycemic Control Guidelines for People with Diabetes:Non Diabetic:  <6%Goal of Therapy: <7%Additional Action Suggested:  >8%   12/18/2015 02:30 AM 5.6 4.8 - 5.6 % Final    Comment:    (NOTE)         Pre-diabetes: 5.7 - 6.4         Diabetes: >6.4         Glycemic control for adults with diabetes: <7.0     CBG: Recent Labs  Lab 01/01/23 1522  GLUCAP 134*      Cheri Fowler, MD [facility] See Amion for pager If no response to pager, please call [phone] until 7pm After 7pm, Please call E-link [phone]

## 2023-01-03 DIAGNOSIS — J441 Chronic obstructive pulmonary disease with (acute) exacerbation: Secondary | ICD-10-CM | POA: Diagnosis not present

## 2023-01-03 DIAGNOSIS — J9601 Acute respiratory failure with hypoxia: Secondary | ICD-10-CM | POA: Diagnosis not present

## 2023-01-03 DIAGNOSIS — E440 Moderate protein-calorie malnutrition: Secondary | ICD-10-CM | POA: Insufficient documentation

## 2023-01-03 LAB — BASIC METABOLIC PANEL
Anion gap: 9 (ref 5–15)
BUN: 29 mg/dL — ABNORMAL HIGH (ref 8–23)
CO2: 29 mmol/L (ref 22–32)
Calcium: 9.4 mg/dL (ref 8.9–10.3)
Chloride: 99 mmol/L (ref 98–111)
Creatinine, Ser: 0.61 mg/dL (ref 0.44–1.00)
GFR, Estimated: >60 mL/min (ref >60)
Glucose, Bld: 136 mg/dL — ABNORMAL HIGH (ref 70–99)
Potassium: 4.4 mmol/L (ref 3.5–5.1)
Sodium: 137 mmol/L (ref 135–145)

## 2023-01-03 NOTE — Progress Notes (Signed)
[facility] PROGRESS NOTE    Progress Note  [name]  XBM:[id number] DOB: 03/01/1950 DOA: 12/31/2022 PCP: Arrie Senate, FNP     Brief Narrative:   [name] [name] is an 72 y.o. female past medical history of COPD and upper respiratory tract infection about 2 weeks ago, smoker who quit earlier this year in 2024,  Comes in with worsening shortness of breath  Assessment/Plan:   Acute respiratory failure with hypoxemia secondary to COPD with acute exacerbation (HCC)/metapneumovirus Admitted under PCCM and started on IV steroids inhalers and a trial of Lasix. They were able to get her off BiPAP and wean her to 2 L of oxygen.  She is negative about 2.5 L. 2D echo showed an EF of 65% grade 1 diastolic dysfunction no wall motion abnormality. On admission she was started on Rocephin and azithromycin she has remained afebrile will complete a 5-day course. Continue to monitor blood glucose.  Acute kidney injury: Has been ruled out.  Pure hypercholesterolemia Continue statins.  Malnutrition of moderate degree Noted.   DVT prophylaxis: lovenox Family Communication:none Status is: Inpatient Remains inpatient appropriate because: Acute COPD exacerbation    Code Status:     Code Status Orders  (From admission, onward)           Start     Ordered   01/01/23 0016  Full code  Continuous       Question:  By:  Answer:  Consent: discussion documented in EHR   01/01/23 0017           Code Status History     Date Active Date Inactive Code Status Order ID Comments User Context   03/02/2020 2321 03/03/2020 1829 Full Code [id number]  Rometta Emery, MD ED   12/18/2015 0141 12/19/2015 1833 Full Code [id number]  Therisa Doyne, MD Inpatient   10/31/2014 1725 11/04/2014 1607 Full Code [id number]  Alleen Borne, MD Inpatient   10/26/2014 2247 10/31/2014 1725 Full Code [id number]  Alleen Borne, MD Inpatient         IV Access:   Peripheral  IV   Procedures and diagnostic studies:   ECHOCARDIOGRAM COMPLETE  Result Date: 01/02/2023    ECHOCARDIOGRAM REPORT   Patient Name:   [name] [name] Date of Exam: 01/02/2023 Medical Rec #:  [id number]      Height:       60.6 in Accession #:    [id number]     Weight:       159.0 lb Date of Birth:  Jun 18, 1950      BSA:          1.705 m Patient Age:    72 years       BP:           101/55 mmHg Patient Gender: F              HR:           102 bpm. Exam Location:  Inpatient Procedure: 2D Echo, Cardiac Doppler, Color Doppler and Intracardiac            Opacification Agent Indications:    CHF, Acute Diastolic  History:        Patient has prior history of Echocardiogram examinations, most                 recent 12/19/2015. COPD.  Sonographer:    Karma Ganja Referring Phys: Cheri Fowler  Sonographer Comments: Technically challenging study due to limited acoustic windows. Image  acquisition challenging due to respiratory motion. IMPRESSIONS  1. Left ventricular ejection fraction, by estimation, is 65 to 70%. The left ventricle has normal function. The left ventricle has no regional wall motion abnormalities. There is mild left ventricular hypertrophy. Left ventricular diastolic parameters are consistent with Grade I diastolic dysfunction (impaired relaxation).  2. Right ventricular systolic function is normal. The right ventricular size is mildly enlarged. There is mildly elevated pulmonary artery systolic pressure.  3. The mitral valve is grossly normal. No evidence of mitral valve regurgitation.  4. The aortic valve was not well visualized. Aortic valve regurgitation is not visualized.  5. The inferior vena cava is normal in size with <50% respiratory variability, suggesting right atrial pressure of 8 mmHg. FINDINGS  Left Ventricle: Left ventricular ejection fraction, by estimation, is 65 to 70%. The left ventricle has normal function. The left ventricle has no regional wall motion abnormalities. Definity contrast  agent was given IV to delineate the left ventricular  endocardial borders. The left ventricular internal cavity size was normal in size. There is mild left ventricular hypertrophy. Left ventricular diastolic parameters are consistent with Grade I diastolic dysfunction (impaired relaxation). Right Ventricle: The right ventricular size is mildly enlarged. Right ventricular systolic function is normal. There is mildly elevated pulmonary artery systolic pressure. The tricuspid regurgitant velocity is 2.91 m/s, and with an assumed right atrial pressure of 8 mmHg, the estimated right ventricular systolic pressure is 41.9 mmHg. Left Atrium: Left atrial size was normal in size. Right Atrium: Right atrial size was normal in size. Pericardium: There is no evidence of pericardial effusion. Mitral Valve: The mitral valve is grossly normal. No evidence of mitral valve regurgitation. Tricuspid Valve: Tricuspid valve regurgitation is mild. Aortic Valve: The aortic valve was not well visualized. Aortic valve regurgitation is not visualized. Aortic valve mean gradient measures 6.0 mmHg. Aortic valve peak gradient measures 12.7 mmHg. Aortic valve area, by VTI measures 2.10 cm. Pulmonic Valve: Pulmonic valve regurgitation is not visualized. Aorta: The aortic root and ascending aorta are structurally normal, with no evidence of dilitation. Venous: The inferior vena cava is normal in size with less than 50% respiratory variability, suggesting right atrial pressure of 8 mmHg. IAS/Shunts: No atrial level shunt detected by color flow Doppler.  LEFT VENTRICLE PLAX 2D LVIDd:         3.00 cm   Diastology LVIDs:         1.65 cm   LV e' medial:    7.62 cm/s LV PW:         1.10 cm   LV E/e' medial:  9.1 LV IVS:        1.10 cm   LV e' lateral:   7.94 cm/s LVOT diam:     1.90 cm   LV E/e' lateral: 8.7 LV SV:         51 LV SV Index:   30 LVOT Area:     2.84 cm  RIGHT VENTRICLE             IVC RV Basal diam:  4.40 cm     IVC diam: 2.10 cm RV S  prime:     28.70 cm/s TAPSE (M-mode): 2.7 cm LEFT ATRIUM             Index        RIGHT ATRIUM           Index LA diam:        2.60 cm 1.52 cm/m   RA  Area:     13.00 cm LA Vol (A2C):   12.6 ml 7.39 ml/m   RA Volume:   24.30 ml  14.25 ml/m LA Vol (A4C):   18.6 ml 10.91 ml/m LA Biplane Vol: 15.5 ml 9.09 ml/m  AORTIC VALVE AV Area (Vmax):    2.05 cm AV Area (Vmean):   1.93 cm AV Area (VTI):     2.10 cm AV Vmax:           178.00 cm/s AV Vmean:          111.000 cm/s AV VTI:            0.244 m AV Peak Grad:      12.7 mmHg AV Mean Grad:      6.0 mmHg LVOT Vmax:         129.00 cm/s LVOT Vmean:        75.500 cm/s LVOT VTI:          0.181 m LVOT/AV VTI ratio: 0.74  AORTA Ao Root diam: 3.00 cm MITRAL VALVE               TRICUSPID VALVE MV Area (PHT): 3.83 cm    TR Peak grad:   33.9 mmHg MV Decel Time: 198 msec    TR Vmax:        291.00 cm/s MV E velocity: 69.20 cm/s MV A velocity: 69.70 cm/s  SHUNTS MV E/A ratio:  0.99        Systemic VTI:  0.18 m                            Systemic Diam: 1.90 cm Carolan Clines Electronically signed by Carolan Clines Signature Date/Time: 01/02/2023/11:36:22 AM    Final    DG CHEST PORT 1 VIEW  Result Date: 01/01/2023 CLINICAL DATA:  Hypoxia. EXAM: PORTABLE CHEST 1 VIEW COMPARISON:  12/31/2022 and older studies.  CT, 03/02/2020. FINDINGS: Cardiac silhouette normal in size.  No mediastinal or hilar masses. Lungs are hyperexpanded. There is chronic interstitial thickening most evident in the lower lungs. There is relative lucency in the upper lungs, right greater than left, consistent with emphysema. No lung consolidation. No evidence of edema. No pleural effusion or pneumothorax. Skeletal structures are grossly intact. IMPRESSION: 1. No acute cardiopulmonary disease. 2. Chronic lung changes consistent with emphysema and chronic interstitial thickening. Electronically Signed   By: Amie Portland M.D.   On: 01/01/2023 14:44     Medical Consultants:   None.   Subjective:     JOSALINE LEZAMA relates her breathing is about the same as yesterday.  Objective:    Vitals:   01/03/23 0321 01/03/23 0400 01/03/23 0500 01/03/23 0600  BP:  105/60 114/62 113/68  Pulse:      Resp:  15 15 13   Temp: 97.9 F (36.6 C)     TempSrc: Axillary     SpO2:  96% 95% 92%  Weight:      Height:       SpO2: 92 % O2 Flow Rate (L/min): 2 L/min FiO2 (%): 30 %   Intake/Output Summary (Last 24 hours) at 01/03/2023 0703 Last data filed at 01/03/2023 0651 Gross per 24 hour  Intake 340 ml  Output 1925 ml  Net -1585 ml   Filed Weights   01/01/23 0150 01/01/23 1500  Weight: 62.6 kg 72.1 kg    Exam: General exam: In no acute distress. Respiratory system: Good air movement and clear  to auscultation. Cardiovascular system: S1 & S2 heard, RRR. No JVD. Gastrointestinal system: Abdomen is nondistended, soft and nontender.  Extremities: No pedal edema. Skin: No rashes, lesions or ulcers Psychiatry: Judgement and insight appear normal. Mood & affect appropriate.    Data Reviewed:    Labs: Basic Metabolic Panel: Recent Labs  Lab 12/31/22 2017 12/31/22 2029 01/01/23 0333 01/01/23 1617 01/02/23 0302 01/03/23 0229  NA 138 139  --  137 139 137  K 3.8 3.7  --  3.4* 3.4* 4.4  CL 103  --   --  97* 100 99  CO2 25  --   --  26 28 29   GLUCOSE 112*  --   --  119* 89 136*  BUN 19  --   --  21 22 29*  CREATININE 0.68  --  1.54* 0.63 0.67 0.61  CALCIUM 9.2  --   --  8.3* 8.4* 9.4   GFR Estimated Creatinine Clearance: 58 mL/min (by C-G formula based on SCr of 0.61 mg/dL). Liver Function Tests: Recent Labs  Lab 12/31/22 2017  AST 33  ALT 34  ALKPHOS 64  BILITOT 0.9  PROT 7.4  ALBUMIN 3.6   No results for input(s): "LIPASE", "AMYLASE" in the last 168 hours. No results for input(s): "AMMONIA" in the last 168 hours. Coagulation profile No results for input(s): "INR", "PROTIME" in the last 168 hours. COVID-19 Labs  No results for input(s): "DDIMER", "FERRITIN",  "LDH", "CRP" in the last 72 hours.  Lab Results  Component Value Date   SARSCOV2NAA NEGATIVE 12/31/2022   SARSCOV2NAA NEGATIVE 12/26/2022   SARSCOV2NAA NEGATIVE 02/02/2021    CBC: Recent Labs  Lab 12/31/22 2017 12/31/22 2029 01/01/23 0333  WBC 11.0*  --  11.5*  NEUTROABS 9.1*  --   --   HGB 14.0 14.3 13.9  HCT 42.0 42.0 42.5  MCV 83.5  --  83.2  PLT 390  --  367   Cardiac Enzymes: No results for input(s): "CKTOTAL", "CKMB", "CKMBINDEX", "TROPONINI" in the last 168 hours. BNP (last 3 results) Recent Labs    09/17/22 1059  PROBNP 29.0   CBG: Recent Labs  Lab 01/01/23 1522  GLUCAP 134*   D-Dimer: No results for input(s): "DDIMER" in the last 72 hours. Hgb A1c: No results for input(s): "HGBA1C" in the last 72 hours. Lipid Profile: No results for input(s): "CHOL", "HDL", "LDLCALC", "TRIG", "CHOLHDL", "LDLDIRECT" in the last 72 hours. Thyroid function studies: No results for input(s): "TSH", "T4TOTAL", "T3FREE", "THYROIDAB" in the last 72 hours.  Invalid input(s): "FREET3" Anemia work up: No results for input(s): "VITAMINB12", "FOLATE", "FERRITIN", "TIBC", "IRON", "RETICCTPCT" in the last 72 hours. Sepsis Labs: Recent Labs  Lab 12/31/22 2017 01/01/23 0333 01/01/23 1617  PROCALCITON  --   --  <0.10  WBC 11.0* 11.5*  --    Microbiology Recent Results (from the past 240 hour(s))  Resp panel by RT-PCR (RSV, Flu A&B, Covid) Anterior Nasal Swab     Status: None   Collection Time: 12/26/22  3:05 PM   Specimen: Anterior Nasal Swab  Result Value Ref Range Status   SARS Coronavirus 2 by RT PCR NEGATIVE NEGATIVE Final   Influenza A by PCR NEGATIVE NEGATIVE Final   Influenza B by PCR NEGATIVE NEGATIVE Final    Comment: (NOTE) The Xpert Xpress SARS-CoV-2/FLU/RSV plus assay is intended as an aid in the diagnosis of influenza from Nasopharyngeal swab specimens and should not be used as a sole basis for treatment. Nasal washings and aspirates are  unacceptable for Xpert  Xpress SARS-CoV-2/FLU/RSV testing.  Fact Sheet for Patients: BloggerCourse.com  Fact Sheet for Healthcare Providers: SeriousBroker.it  This test is not yet approved or cleared by the [place] FDA and has been authorized for detection and/or diagnosis of SARS-CoV-2 by FDA under an Emergency Use Authorization (EUA). This EUA will remain in effect (meaning this test can be used) for the duration of the COVID-19 declaration under Section 564(b)(1) of the Act, 21 U.S.C. section 360bbb-3(b)(1), unless the authorization is terminated or revoked.     Resp Syncytial Virus by PCR NEGATIVE NEGATIVE Final    Comment: (NOTE) Fact Sheet for Patients: BloggerCourse.com  Fact Sheet for Healthcare Providers: SeriousBroker.it  This test is not yet approved or cleared by the [place] FDA and has been authorized for detection and/or diagnosis of SARS-CoV-2 by FDA under an Emergency Use Authorization (EUA). This EUA will remain in effect (meaning this test can be used) for the duration of the COVID-19 declaration under Section 564(b)(1) of the Act, 21 U.S.C. section 360bbb-3(b)(1), unless the authorization is terminated or revoked.  Performed at [facility], [address], [place], [place] [address]   Resp panel by RT-PCR (RSV, Flu A&B, Covid) Anterior Nasal Swab     Status: None   Collection Time: 12/31/22  8:25 PM   Specimen: Anterior Nasal Swab  Result Value Ref Range Status   SARS Coronavirus 2 by RT PCR NEGATIVE NEGATIVE Final   Influenza A by PCR NEGATIVE NEGATIVE Final   Influenza B by PCR NEGATIVE NEGATIVE Final    Comment: (NOTE) The Xpert Xpress SARS-CoV-2/FLU/RSV plus assay is intended as an aid in the diagnosis of influenza from Nasopharyngeal swab specimens and should not be used as a sole basis for treatment. Nasal washings and aspirates are unacceptable for  Xpert Xpress SARS-CoV-2/FLU/RSV testing.  Fact Sheet for Patients: BloggerCourse.com  Fact Sheet for Healthcare Providers: SeriousBroker.it  This test is not yet approved or cleared by the [place] FDA and has been authorized for detection and/or diagnosis of SARS-CoV-2 by FDA under an Emergency Use Authorization (EUA). This EUA will remain in effect (meaning this test can be used) for the duration of the COVID-19 declaration under Section 564(b)(1) of the Act, 21 U.S.C. section 360bbb-3(b)(1), unless the authorization is terminated or revoked.     Resp Syncytial Virus by PCR NEGATIVE NEGATIVE Final    Comment: (NOTE) Fact Sheet for Patients: BloggerCourse.com  Fact Sheet for Healthcare Providers: SeriousBroker.it  This test is not yet approved or cleared by the [place] FDA and has been authorized for detection and/or diagnosis of SARS-CoV-2 by FDA under an Emergency Use Authorization (EUA). This EUA will remain in effect (meaning this test can be used) for the duration of the COVID-19 declaration under Section 564(b)(1) of the Act, 21 U.S.C. section 360bbb-3(b)(1), unless the authorization is terminated or revoked.  Performed at [facility], [address] [place], [place] [address]   Respiratory (~20 pathogens) panel by PCR     Status: Abnormal   Collection Time: 01/01/23 10:01 AM   Specimen: Nasopharyngeal Swab; Respiratory  Result Value Ref Range Status   Adenovirus NOT DETECTED NOT DETECTED Final   Coronavirus 229E NOT DETECTED NOT DETECTED Final    Comment: (NOTE) The Coronavirus on the Respiratory Panel, DOES NOT test for the novel  Coronavirus (2019 nCoV)    Coronavirus HKU1 NOT DETECTED NOT DETECTED Final   Coronavirus NL63 NOT DETECTED NOT DETECTED Final   Coronavirus  OC43 NOT DETECTED NOT DETECTED Final   Metapneumovirus DETECTED (A) NOT  DETECTED Final   Rhinovirus / Enterovirus NOT DETECTED NOT DETECTED Final   Influenza A NOT DETECTED NOT DETECTED Final   Influenza B NOT DETECTED NOT DETECTED Final   Parainfluenza Virus 1 NOT DETECTED NOT DETECTED Final   Parainfluenza Virus 2 NOT DETECTED NOT DETECTED Final   Parainfluenza Virus 3 NOT DETECTED NOT DETECTED Final   Parainfluenza Virus 4 NOT DETECTED NOT DETECTED Final   Respiratory Syncytial Virus NOT DETECTED NOT DETECTED Final   Bordetella pertussis NOT DETECTED NOT DETECTED Final   Bordetella Parapertussis NOT DETECTED NOT DETECTED Final   Chlamydophila pneumoniae NOT DETECTED NOT DETECTED Final   Mycoplasma pneumoniae NOT DETECTED NOT DETECTED Final    Comment: Performed at [facility], [address], [place], [place] [address]  MRSA Next Gen by PCR, Nasal     Status: None   Collection Time: 01/02/23  5:25 PM   Specimen: Nasal Mucosa; Nasal Swab  Result Value Ref Range Status   MRSA by PCR Next Gen NOT DETECTED NOT DETECTED Final    Comment: (NOTE) The GeneXpert MRSA Assay (FDA approved for NASAL specimens only), is one component of a comprehensive MRSA colonization surveillance program. It is not intended to diagnose MRSA infection nor to guide or monitor treatment for MRSA infections. Test performance is not FDA approved in patients less than 66 years old. Performed at [facility], [address], [place], [place] [address]      Medications:    amLODipine  5 mg Oral Daily   arformoterol  15 mcg Nebulization BID   azithromycin  250 mg Oral Daily   Chlorhexidine Gluconate Cloth  6 each Topical Daily   dextromethorphan-guaiFENesin  1 tablet Oral BID   enoxaparin (LOVENOX) injection  40 mg Subcutaneous Daily   feeding supplement  237 mL Oral TID BM   ipratropium-albuterol  3 mL Nebulization Q4H WA   methylPREDNISolone (SOLU-MEDROL) injection  40 mg Intravenous Q12H   revefenacin  175 mcg Nebulization Daily   Continuous Infusions:   cefTRIAXone (ROCEPHIN)  IV Stopped (01/02/23 2245)      LOS: 2 days   Marinda Elk  [facility]  01/03/2023, 7:03 AM

## 2023-01-03 NOTE — Plan of Care (Signed)

## 2023-01-04 DIAGNOSIS — J9601 Acute respiratory failure with hypoxia: Secondary | ICD-10-CM | POA: Diagnosis not present

## 2023-01-04 LAB — BASIC METABOLIC PANEL
Anion gap: 13 (ref 5–15)
BUN: 27 mg/dL — ABNORMAL HIGH (ref 8–23)
CO2: 30 mmol/L (ref 22–32)
Calcium: 9.8 mg/dL (ref 8.9–10.3)
Chloride: 96 mmol/L — ABNORMAL LOW (ref 98–111)
Creatinine, Ser: 0.61 mg/dL (ref 0.44–1.00)
GFR, Estimated: >60 mL/min (ref >60)
Glucose, Bld: 131 mg/dL — ABNORMAL HIGH (ref 70–99)
Potassium: 4.8 mmol/L (ref 3.5–5.1)
Sodium: 139 mmol/L (ref 135–145)

## 2023-01-04 LAB — BRAIN NATRIURETIC PEPTIDE: B Natriuretic Peptide: 33.1 pg/mL (ref 0.0–100.0)

## 2023-01-04 MED ORDER — METHYLPREDNISOLONE SODIUM SUCC 40 MG IJ SOLR
40.0000 mg | Freq: Every day | INTRAMUSCULAR | Status: DC
Start: 2023-01-05 — End: 2023-01-06
  Administered 2023-01-05 – 2023-01-06 (×2): 40 mg via INTRAVENOUS
  Filled 2023-01-04 (×2): qty 1

## 2023-01-04 MED ORDER — LORATADINE 10 MG PO TABS
10.0000 mg | ORAL_TABLET | Freq: Every day | ORAL | Status: DC
  Administered 2023-01-04 – 2023-01-06 (×3): 10 mg via ORAL
  Filled 2023-01-04 (×3): qty 1

## 2023-01-04 MED ORDER — CALCIUM CARBONATE 1250 (500 CA) MG PO TABS
1.0000 | ORAL_TABLET | Freq: Every day | ORAL | Status: DC
  Administered 2023-01-05: 1250 mg via ORAL
  Filled 2023-01-04 (×3): qty 1

## 2023-01-04 MED ORDER — FUROSEMIDE 10 MG/ML IJ SOLN
40.0000 mg | Freq: Once | INTRAMUSCULAR | Status: AC
  Administered 2023-01-04: 40 mg via INTRAVENOUS
  Filled 2023-01-04: qty 4

## 2023-01-04 MED ORDER — PROSOURCE PLUS PO LIQD
30.0000 mL | Freq: Two times a day (BID) | ORAL | Status: DC
  Administered 2023-01-05 (×2): 30 mL via ORAL
  Filled 2023-01-04 (×2): qty 30

## 2023-01-04 MED ORDER — VITAMIN B-12 1000 MCG PO TABS
1000.0000 ug | ORAL_TABLET | Freq: Every day | ORAL | Status: DC
  Administered 2023-01-04 – 2023-01-06 (×3): 1000 ug via ORAL
  Filled 2023-01-04 (×3): qty 1

## 2023-01-04 MED ORDER — CALCIUM CARBONATE 600 MG PO TABS: 600.0000 mg | ORAL_TABLET | Freq: Every day | ORAL | Status: DC

## 2023-01-04 NOTE — Progress Notes (Signed)
Mobility Specialist Progress Note;   01/04/23 1435  Mobility  Activity Ambulated with assistance in hallway  Level of Assistance Contact guard assist, steadying assist  Assistive Device Front wheel walker  Distance Ambulated (ft) 300 ft  Activity Response Tolerated well  Mobility Referral Yes  $Mobility charge 1 Mobility  Mobility Specialist Start Time (ACUTE ONLY) 1435  Mobility Specialist Stop Time (ACUTE ONLY) 1455  Mobility Specialist Time Calculation (min) (ACUTE ONLY) 20 min   Pt eager for mobility. On 2LO2 upon arrival. Required MinG assistance during ambulation for safety. Ambulated on 2LO2, VSS thorughout. Took 1x standing rest break d/t fatigue. Encouraged pursed lip breathing throughout ambulation. No c/o throughout session. Pt returned back to chair with all needs met, on 2LO2. Eager for more mobility.  Caesar Bookman Mobility Specialist Please contact via SecureChat or Rehab Office [phone]

## 2023-01-04 NOTE — Progress Notes (Signed)
PROGRESS NOTE                                                                                                                                                                                                             Patient Demographics:    [name] [name], is a 72 y.o. female, DOB - [birth year], ZOX:[id number]  Outpatient Primary MD for the patient is [name], [name], FNP    LOS - 3  Admit date - 12/31/2022    Chief Complaint  Patient presents with   Shortness of Breath       Brief Narrative (HPI from H&P)   72 y.o. female past medical history of COPD and upper respiratory tract infection about 2 weeks ago, smoker who quit earlier this year in 2024,  Comes in with worsening shortness of breath     Subjective:    Brittanni Kugelman today has, No headache, No chest pain, No abdominal pain - No Nausea, No new weakness tingling or numbness, improved SOB   Assessment  & Plan :    Acute respiratory failure with hypoxemia secondary to COPD with acute exacerbation (HCC)/metapneumovirus   Admitted under PCCM and started on IV steroids inhalers, initially required BiPAP currently on nasal cannula oxygen, echo noted with grade 1 diastolic dysfunction, preserved EF of 65%.  Also on empiric IV antibiotics as she had leukocytosis and there was suspicion of bacterial community-acquired pneumonia.  Continue care with IV steroids, IV Lasix on 01/04/2023, advance activity and titrate down oxygen, encouraged to sit in chair use I-S and flutter valve for pulmonary toiletry.     Pure hypercholesterolemia  Continue statins.   Malnutrition of moderate degree - Noted.  Placed on protein supplementation.        Condition - Fair  Family Communication  :  None  Code Status :  Full  Consults  :  None  PUD Prophylaxis :    Procedures  :     TTE - 1. Left ventricular ejection fraction, by estimation, is 65 to 70%. The left ventricle  has normal function. The left ventricle has no regional wall motion abnormalities. There is mild left ventricular hypertrophy. Left ventricular diastolic parameters are consistent with Grade I diastolic dysfunction (impaired relaxation).  2. Right ventricular systolic function is normal. The right ventricular size is mildly enlarged. There is mildly  elevated pulmonary artery systolic pressure.  3. The mitral valve is grossly normal. No evidence of mitral valve regurgitation.  4. The aortic valve was not well visualized. Aortic valve regurgitation is not visualized.  5. The inferior vena cava is normal in size with <50% respiratory variability, suggesting right atrial pressure of 8 mmHg      Disposition Plan  :    Status is: Inpatient   DVT Prophylaxis  :    enoxaparin (LOVENOX) injection 40 mg Start: 01/01/23 1000    Lab Results  Component Value Date   PLT 367 01/01/2023    Diet :  Diet Order             DIET DYS 3 Room service appropriate? Yes with Assist; Fluid consistency: Thin  Diet effective now                    Inpatient Medications  Scheduled Meds:  amLODipine  5 mg Oral Daily   arformoterol  15 mcg Nebulization BID   azithromycin  250 mg Oral Daily   [START ON 01/05/2023] calcium carbonate  600 mg Oral Q breakfast   Chlorhexidine Gluconate Cloth  6 each Topical Daily   cyanocobalamin  1,000 mcg Oral Daily   dextromethorphan-guaiFENesin  1 tablet Oral BID   enoxaparin (LOVENOX) injection  40 mg Subcutaneous Daily   feeding supplement  237 mL Oral TID BM   furosemide  40 mg Intravenous Once   ipratropium-albuterol  3 mL Nebulization Q4H WA   loratadine  10 mg Oral Daily   [START ON 01/05/2023] methylPREDNISolone (SOLU-MEDROL) injection  40 mg Intravenous Daily   revefenacin  175 mcg Nebulization Daily   Continuous Infusions:  cefTRIAXone (ROCEPHIN)  IV Stopped (01/04/23 0838)   PRN Meds:.benzonatate, hydrALAZINE, lip balm, mouth rinse    Objective:    Vitals:   01/04/23 0030 01/04/23 0505 01/04/23 0759 01/04/23 0843  BP: (!) 140/85 121/82  122/73  Pulse: 99 85    Resp:    (!) 24  Temp: (!) 97.3 F (36.3 C) 97.7 F (36.5 C)  97.9 F (36.6 C)  TempSrc: Oral Oral  Oral  SpO2: 98% (!) 75% 92% 98%  Weight:      Height:        Wt Readings from Last 3 Encounters:  01/01/23 72.1 kg  10/16/22 72.6 kg  09/17/22 71.8 kg     Intake/Output Summary (Last 24 hours) at 01/04/2023 0272 Last data filed at 01/03/2023 1000 Gross per 24 hour  Intake 240 ml  Output --  Net 240 ml     Physical Exam  Awake Alert, No new F.N deficits, Normal affect [place].AT,PERRAL Supple Neck, No JVD,   Symmetrical Chest wall movement, Good air movement bilaterally, CTAB RRR,No Gallops,Rubs or new Murmurs,  +ve B.Sounds, Abd Soft, No tenderness,   No Cyanosis, Clubbing or edema     Data Review:    Recent Labs  Lab 12/31/22 2017 12/31/22 2029 01/01/23 0333  WBC 11.0*  --  11.5*  HGB 14.0 14.3 13.9  HCT 42.0 42.0 42.5  PLT 390  --  367  MCV 83.5  --  83.2  MCH 27.8  --  27.2  MCHC 33.3  --  32.7  RDW 12.8  --  12.7  LYMPHSABS 1.2  --   --   MONOABS 0.6  --   --   EOSABS 0.0  --   --   BASOSABS 0.0  --   --  Recent Labs  Lab 12/31/22 2017 12/31/22 2029 01/01/23 0333 01/01/23 1617 01/02/23 0302 01/03/23 0229  NA 138 139  --  137 139 137  K 3.8 3.7  --  3.4* 3.4* 4.4  CL 103  --   --  97* 100 99  CO2 25  --   --  26 28 29   ANIONGAP 10  --   --  14 11 9   GLUCOSE 112*  --   --  119* 89 136*  BUN 19  --   --  21 22 29*  CREATININE 0.68  --  1.54* 0.63 0.67 0.61  AST 33  --   --   --   --   --   ALT 34  --   --   --   --   --   ALKPHOS 64  --   --   --   --   --   BILITOT 0.9  --   --   --   --   --   ALBUMIN 3.6  --   --   --   --   --   PROCALCITON  --   --   --  <0.10  --   --   BNP 56.7  --   --   --   --   --   CALCIUM 9.2  --   --  8.3* 8.4* 9.4      Recent Labs  Lab 12/31/22 2017 01/01/23 1617 01/02/23 0302  01/03/23 0229  PROCALCITON  --  <0.10  --   --   BNP 56.7  --   --   --   CALCIUM 9.2 8.3* 8.4* 9.4    --------------------------------------------------------------------------------------------------------------- Lab Results  Component Value Date   CHOL 186 04/11/2022   HDL 70 04/11/2022   LDLCALC 104 (H) 04/11/2022   TRIG 63 04/11/2022   CHOLHDL 2.7 04/11/2022    Lab Results  Component Value Date   HGBA1C 5.4 10/15/2021   Radiology Reports ECHOCARDIOGRAM COMPLETE  Result Date: 01/02/2023    ECHOCARDIOGRAM REPORT   Patient Name:   ELDON SANTIZO Date of Exam: 01/02/2023 Medical Rec #:  [id number]      Height:       60.6 in Accession #:    [id number]     Weight:       159.0 lb Date of Birth:  09-04-1950      BSA:          1.705 m Patient Age:    72 years       BP:           101/55 mmHg Patient Gender: F              HR:           102 bpm. Exam Location:  Inpatient Procedure: 2D Echo, Cardiac Doppler, Color Doppler and Intracardiac            Opacification Agent Indications:    CHF, Acute Diastolic  History:        Patient has prior history of Echocardiogram examinations, most                 recent 12/19/2015. COPD.  Sonographer:    Karma Ganja Referring Phys: Cheri Fowler  Sonographer Comments: Technically challenging study due to limited acoustic windows. Image acquisition challenging due to respiratory motion. IMPRESSIONS  1. Left ventricular ejection fraction, by estimation, is 65 to 70%. The left ventricle has  normal function. The left ventricle has no regional wall motion abnormalities. There is mild left ventricular hypertrophy. Left ventricular diastolic parameters are consistent with Grade I diastolic dysfunction (impaired relaxation).  2. Right ventricular systolic function is normal. The right ventricular size is mildly enlarged. There is mildly elevated pulmonary artery systolic pressure.  3. The mitral valve is grossly normal. No evidence of mitral valve regurgitation.  4. The  aortic valve was not well visualized. Aortic valve regurgitation is not visualized.  5. The inferior vena cava is normal in size with <50% respiratory variability, suggesting right atrial pressure of 8 mmHg. FINDINGS  Left Ventricle: Left ventricular ejection fraction, by estimation, is 65 to 70%. The left ventricle has normal function. The left ventricle has no regional wall motion abnormalities. Definity contrast agent was given IV to delineate the left ventricular  endocardial borders. The left ventricular internal cavity size was normal in size. There is mild left ventricular hypertrophy. Left ventricular diastolic parameters are consistent with Grade I diastolic dysfunction (impaired relaxation). Right Ventricle: The right ventricular size is mildly enlarged. Right ventricular systolic function is normal. There is mildly elevated pulmonary artery systolic pressure. The tricuspid regurgitant velocity is 2.91 m/s, and with an assumed right atrial pressure of 8 mmHg, the estimated right ventricular systolic pressure is 41.9 mmHg. Left Atrium: Left atrial size was normal in size. Right Atrium: Right atrial size was normal in size. Pericardium: There is no evidence of pericardial effusion. Mitral Valve: The mitral valve is grossly normal. No evidence of mitral valve regurgitation. Tricuspid Valve: Tricuspid valve regurgitation is mild. Aortic Valve: The aortic valve was not well visualized. Aortic valve regurgitation is not visualized. Aortic valve mean gradient measures 6.0 mmHg. Aortic valve peak gradient measures 12.7 mmHg. Aortic valve area, by VTI measures 2.10 cm. Pulmonic Valve: Pulmonic valve regurgitation is not visualized. Aorta: The aortic root and ascending aorta are structurally normal, with no evidence of dilitation. Venous: The inferior vena cava is normal in size with less than 50% respiratory variability, suggesting right atrial pressure of 8 mmHg. IAS/Shunts: No atrial level shunt detected by  color flow Doppler.  LEFT VENTRICLE PLAX 2D LVIDd:         3.00 cm   Diastology LVIDs:         1.65 cm   LV e' medial:    7.62 cm/s LV PW:         1.10 cm   LV E/e' medial:  9.1 LV IVS:        1.10 cm   LV e' lateral:   7.94 cm/s LVOT diam:     1.90 cm   LV E/e' lateral: 8.7 LV SV:         51 LV SV Index:   30 LVOT Area:     2.84 cm  RIGHT VENTRICLE             IVC RV Basal diam:  4.40 cm     IVC diam: 2.10 cm RV S prime:     28.70 cm/s TAPSE (M-mode): 2.7 cm LEFT ATRIUM             Index        RIGHT ATRIUM           Index LA diam:        2.60 cm 1.52 cm/m   RA Area:     13.00 cm LA Vol (A2C):   12.6 ml 7.39 ml/m   RA Volume:   24.30  ml  14.25 ml/m LA Vol (A4C):   18.6 ml 10.91 ml/m LA Biplane Vol: 15.5 ml 9.09 ml/m  AORTIC VALVE AV Area (Vmax):    2.05 cm AV Area (Vmean):   1.93 cm AV Area (VTI):     2.10 cm AV Vmax:           178.00 cm/s AV Vmean:          111.000 cm/s AV VTI:            0.244 m AV Peak Grad:      12.7 mmHg AV Mean Grad:      6.0 mmHg LVOT Vmax:         129.00 cm/s LVOT Vmean:        75.500 cm/s LVOT VTI:          0.181 m LVOT/AV VTI ratio: 0.74  AORTA Ao Root diam: 3.00 cm MITRAL VALVE               TRICUSPID VALVE MV Area (PHT): 3.83 cm    TR Peak grad:   33.9 mmHg MV Decel Time: 198 msec    TR Vmax:        291.00 cm/s MV E velocity: 69.20 cm/s MV A velocity: 69.70 cm/s  SHUNTS MV E/A ratio:  0.99        Systemic VTI:  0.18 m                            Systemic Diam: 1.90 cm Carolan Clines Electronically signed by Carolan Clines Signature Date/Time: 01/02/2023/11:36:22 AM    Final    DG CHEST PORT 1 VIEW  Result Date: 01/01/2023 CLINICAL DATA:  Hypoxia. EXAM: PORTABLE CHEST 1 VIEW COMPARISON:  12/31/2022 and older studies.  CT, 03/02/2020. FINDINGS: Cardiac silhouette normal in size.  No mediastinal or hilar masses. Lungs are hyperexpanded. There is chronic interstitial thickening most evident in the lower lungs. There is relative lucency in the upper lungs, right greater than left,  consistent with emphysema. No lung consolidation. No evidence of edema. No pleural effusion or pneumothorax. Skeletal structures are grossly intact. IMPRESSION: 1. No acute cardiopulmonary disease. 2. Chronic lung changes consistent with emphysema and chronic interstitial thickening. Electronically Signed   By: Amie Portland M.D.   On: 01/01/2023 14:44   DG Chest Port 1 View  Result Date: 12/31/2022 CLINICAL DATA:  Shortness of breath. EXAM: PORTABLE CHEST 1 VIEW COMPARISON:  12/26/2022 FINDINGS: Slightly improved nonspecific interstitial and airspace opacities in the mid and lower lungs compared to 12/26/2022. Lucencies in the upper lungs suggestive of bullous change. Hyperinflation. Stable cardiomediastinal silhouette. Aortic atherosclerotic calcification. IMPRESSION: Slightly improved nonspecific interstitial and airspace opacities in the mid and lower lungs compared to 12/26/2022. Advanced emphysema Electronically Signed   By: Minerva Fester M.D.   On: 12/31/2022 21:26      Signature  -   Susa Raring M.D on 01/04/2023 at 9:22 AM   -  To page go to [email or web address]

## 2023-01-05 DIAGNOSIS — J9601 Acute respiratory failure with hypoxia: Secondary | ICD-10-CM | POA: Diagnosis not present

## 2023-01-05 LAB — CBC WITH DIFFERENTIAL/PLATELET
Abs Immature Granulocytes: 0.14 10*3/uL — ABNORMAL HIGH (ref 0.00–0.07)
Basophils Absolute: 0 10*3/uL (ref 0.0–0.1)
Basophils Relative: 0 %
Eosinophils Absolute: 0 10*3/uL (ref 0.0–0.5)
Eosinophils Relative: 0 %
HCT: 40.1 % (ref 36.0–46.0)
Hemoglobin: 13 g/dL (ref 12.0–15.0)
Immature Granulocytes: 1 %
Lymphocytes Relative: 14 %
Lymphs Abs: 1.7 10*3/uL (ref 0.7–4.0)
MCH: 27.6 pg (ref 26.0–34.0)
MCHC: 32.4 g/dL (ref 30.0–36.0)
MCV: 85.1 fL (ref 80.0–100.0)
Monocytes Absolute: 0.9 10*3/uL (ref 0.1–1.0)
Monocytes Relative: 7 %
Neutro Abs: 9.6 10*3/uL — ABNORMAL HIGH (ref 1.7–7.7)
Neutrophils Relative %: 78 %
Platelets: 358 10*3/uL (ref 150–400)
RBC: 4.71 MIL/uL (ref 3.87–5.11)
RDW: 12.9 % (ref 11.5–15.5)
WBC: 12.3 10*3/uL — ABNORMAL HIGH (ref 4.0–10.5)
nRBC: 0 % (ref 0.0–0.2)

## 2023-01-05 LAB — BASIC METABOLIC PANEL
Anion gap: 7 (ref 5–15)
BUN: 29 mg/dL — ABNORMAL HIGH (ref 8–23)
CO2: 33 mmol/L — ABNORMAL HIGH (ref 22–32)
Calcium: 9 mg/dL (ref 8.9–10.3)
Chloride: 97 mmol/L — ABNORMAL LOW (ref 98–111)
Creatinine, Ser: 0.68 mg/dL (ref 0.44–1.00)
GFR, Estimated: >60 mL/min (ref >60)
Glucose, Bld: 84 mg/dL (ref 70–99)
Potassium: 3.7 mmol/L (ref 3.5–5.1)
Sodium: 137 mmol/L (ref 135–145)

## 2023-01-05 LAB — LEGIONELLA PNEUMOPHILA SEROGP 1 UR AG: L. pneumophila Serogp 1 Ur Ag: NEGATIVE

## 2023-01-05 LAB — C-REACTIVE PROTEIN: CRP: <0.5 mg/dL (ref <1.0)

## 2023-01-05 LAB — MAGNESIUM: Magnesium: 2.2 mg/dL (ref 1.7–2.4)

## 2023-01-05 LAB — BRAIN NATRIURETIC PEPTIDE: B Natriuretic Peptide: 30.8 pg/mL (ref 0.0–100.0)

## 2023-01-05 MED ORDER — POTASSIUM CHLORIDE CRYS ER 20 MEQ PO TBCR
40.0000 meq | EXTENDED_RELEASE_TABLET | Freq: Once | ORAL | Status: AC
  Administered 2023-01-05: 40 meq via ORAL
  Filled 2023-01-05: qty 2

## 2023-01-05 MED ORDER — FUROSEMIDE 10 MG/ML IJ SOLN
40.0000 mg | Freq: Once | INTRAMUSCULAR | Status: AC
  Administered 2023-01-05: 40 mg via INTRAVENOUS
  Filled 2023-01-05: qty 4

## 2023-01-05 NOTE — Progress Notes (Signed)
PROGRESS NOTE                                                                                                                                                                                                             Patient Demographics:    [name] [name], is a 72 y.o. female, DOB - May 16, 1950, ONG:[id number]  Outpatient Primary MD for the patient is [name] [name], FNP    LOS - 4  Admit date - 12/31/2022    Chief Complaint  Patient presents with   Shortness of Breath       Brief Narrative (HPI from H&P)   72 y.o. female past medical history of COPD and upper respiratory tract infection about 2 weeks ago, smoker who quit earlier this year in 2024,  Comes in with worsening shortness of breath     Subjective:   Patient in bed, appears comfortable, denies any headache, no fever, no chest pain or pressure, no shortness of breath , no abdominal pain. No new focal weakness.   Assessment  & Plan :    Acute respiratory failure with hypoxemia secondary to COPD with acute exacerbation (HCC)/metapneumovirus   Admitted under PCCM and started on IV steroids inhalers, initially required BiPAP currently on nasal cannula oxygen, echo noted with grade 1 diastolic dysfunction, preserved EF of 65%.  Also on empiric IV antibiotics as she had leukocytosis and there was suspicion of bacterial community-acquired pneumonia.  Continue care with IV steroids, IV Lasix on 01/04/2023 - repeat 01/05/23, advance activity and titrate down oxygen, encouraged to sit in chair use I-S and flutter valve for pulmonary toiletry.   SpO2: 96 % O2 Flow Rate (L/min): 2 L/min FiO2 (%): 30 %  Pure hypercholesterolemia  Continue statins.   Malnutrition of moderate degree - Noted.  Placed on protein supplementation.        Condition - Fair  Family Communication  :  None  Code Status :  Full  Consults  :  None  PUD Prophylaxis :    Procedures   :     TTE - 1. Left ventricular ejection fraction, by estimation, is 65 to 70%. The left ventricle has normal function. The left ventricle has no regional wall motion abnormalities. There is mild left ventricular hypertrophy. Left ventricular diastolic parameters are consistent with Grade I diastolic dysfunction (impaired relaxation).  2. Right ventricular systolic function is normal. The right ventricular size is mildly enlarged. There is mildly elevated pulmonary artery systolic pressure.  3. The mitral valve is grossly normal. No evidence of mitral valve regurgitation.  4. The aortic valve was not well visualized. Aortic valve regurgitation is not visualized.  5. The inferior vena cava is normal in size with <50% respiratory variability, suggesting right atrial pressure of 8 mmHg      Disposition Plan  :    Status is: Inpatient   DVT Prophylaxis  :    enoxaparin (LOVENOX) injection 40 mg Start: 01/01/23 1000    Lab Results  Component Value Date   PLT 358 01/05/2023    Diet :  Diet Order             DIET DYS 3 Room service appropriate? Yes with Assist; Fluid consistency: Thin  Diet effective now                    Inpatient Medications  Scheduled Meds:  (feeding supplement) PROSource Plus  30 mL Oral BID BM   amLODipine  5 mg Oral Daily   arformoterol  15 mcg Nebulization BID   calcium carbonate  1 tablet Oral Q breakfast   Chlorhexidine Gluconate Cloth  6 each Topical Daily   cyanocobalamin  1,000 mcg Oral Daily   dextromethorphan-guaiFENesin  1 tablet Oral BID   enoxaparin (LOVENOX) injection  40 mg Subcutaneous Daily   feeding supplement  237 mL Oral TID BM   furosemide  40 mg Intravenous Once   ipratropium-albuterol  3 mL Nebulization Q4H WA   loratadine  10 mg Oral Daily   methylPREDNISolone (SOLU-MEDROL) injection  40 mg Intravenous Daily   potassium chloride  40 mEq Oral Once   revefenacin  175 mcg Nebulization Daily   Continuous Infusions:  cefTRIAXone  (ROCEPHIN)  IV 1 g (01/04/23 2203)   PRN Meds:.benzonatate, hydrALAZINE, lip balm, mouth rinse    Objective:   Vitals:   01/04/23 2338 01/05/23 0452 01/05/23 0514 01/05/23 0804  BP: 122/73 129/67 120/65   Pulse: 90  87 94  Resp: 20  16 (!) 21  Temp: 97.6 F (36.4 C)  (!) 97.4 F (36.3 C)   TempSrc: Oral  Oral   SpO2: 98%  94% 96%  Weight:      Height:        Wt Readings from Last 3 Encounters:  01/01/23 72.1 kg  10/16/22 72.6 kg  09/17/22 71.8 kg     Intake/Output Summary (Last 24 hours) at 01/05/2023 1009 Last data filed at 01/04/2023 1300 Gross per 24 hour  Intake --  Output 700 ml  Net -700 ml     Physical Exam  Awake Alert, No new F.N deficits, Normal affect [place].AT,PERRAL Supple Neck, No JVD,   Symmetrical Chest wall movement, Good air movement bilaterally, few rales RRR,No Gallops,Rubs or new Murmurs,  +ve B.Sounds, Abd Soft, No tenderness,   No Cyanosis, Clubbing or edema     Data Review:    Recent Labs  Lab 12/31/22 2017 12/31/22 2029 01/01/23 0333 01/05/23 0333  WBC 11.0*  --  11.5* 12.3*  HGB 14.0 14.3 13.9 13.0  HCT 42.0 42.0 42.5 40.1  PLT 390  --  367 358  MCV 83.5  --  83.2 85.1  MCH 27.8  --  27.2 27.6  MCHC 33.3  --  32.7 32.4  RDW 12.8  --  12.7 12.9  LYMPHSABS 1.2  --   --  1.7  MONOABS 0.6  --   --  0.9  EOSABS 0.0  --   --  0.0  BASOSABS 0.0  --   --  0.0    Recent Labs  Lab 12/31/22 2017 12/31/22 2029 01/01/23 1617 01/02/23 0302 01/03/23 0229 01/04/23 1007 01/05/23 0333  NA 138   < > 137 139 137 139 137  K 3.8   < > 3.4* 3.4* 4.4 4.8 3.7  CL 103  --  97* 100 99 96* 97*  CO2 25  --  26 28 29 30  33*  ANIONGAP 10  --  14 11 9 13 7   GLUCOSE 112*  --  119* 89 136* 131* 84  BUN 19  --  21 22 29* 27* 29*  CREATININE 0.68   < > 0.63 0.67 0.61 0.61 0.68  AST 33  --   --   --   --   --   --   ALT 34  --   --   --   --   --   --   ALKPHOS 64  --   --   --   --   --   --   BILITOT 0.9  --   --   --   --   --   --    ALBUMIN 3.6  --   --   --   --   --   --   CRP  --   --   --   --   --   --  <0.5  PROCALCITON  --   --  <0.10  --   --   --   --   BNP 56.7  --   --   --   --  33.1 30.8  MG  --   --   --   --   --   --  2.2  CALCIUM 9.2  --  8.3* 8.4* 9.4 9.8 9.0   < > = values in this interval not displayed.      Recent Labs  Lab 12/31/22 2017 01/01/23 1617 01/02/23 0302 01/03/23 0229 01/04/23 1007 01/05/23 0333  CRP  --   --   --   --   --  <0.5  PROCALCITON  --  <0.10  --   --   --   --   BNP 56.7  --   --   --  33.1 30.8  MG  --   --   --   --   --  2.2  CALCIUM 9.2 8.3* 8.4* 9.4 9.8 9.0    --------------------------------------------------------------------------------------------------------------- Lab Results  Component Value Date   CHOL 186 04/11/2022   HDL 70 04/11/2022   LDLCALC 104 (H) 04/11/2022   TRIG 63 04/11/2022   CHOLHDL 2.7 04/11/2022    Lab Results  Component Value Date   HGBA1C 5.4 10/15/2021   Radiology Reports ECHOCARDIOGRAM COMPLETE  Result Date: 01/02/2023    ECHOCARDIOGRAM REPORT   Patient Name:   [name] [name] Date of Exam: 01/02/2023 Medical Rec #:  [id number]      Height:       60.6 in Accession #:    [id number]     Weight:       159.0 lb Date of Birth:  08-16-1950      BSA:          1.705 m Patient Age:    72 years       BP:  101/55 mmHg Patient Gender: F              HR:           102 bpm. Exam Location:  Inpatient Procedure: 2D Echo, Cardiac Doppler, Color Doppler and Intracardiac            Opacification Agent Indications:    CHF, Acute Diastolic  History:        Patient has prior history of Echocardiogram examinations, most                 recent 12/19/2015. COPD.  Sonographer:    Karma Ganja Referring Phys: Cheri Fowler  Sonographer Comments: Technically challenging study due to limited acoustic windows. Image acquisition challenging due to respiratory motion. IMPRESSIONS  1. Left ventricular ejection fraction, by estimation, is 65 to 70%. The  left ventricle has normal function. The left ventricle has no regional wall motion abnormalities. There is mild left ventricular hypertrophy. Left ventricular diastolic parameters are consistent with Grade I diastolic dysfunction (impaired relaxation).  2. Right ventricular systolic function is normal. The right ventricular size is mildly enlarged. There is mildly elevated pulmonary artery systolic pressure.  3. The mitral valve is grossly normal. No evidence of mitral valve regurgitation.  4. The aortic valve was not well visualized. Aortic valve regurgitation is not visualized.  5. The inferior vena cava is normal in size with <50% respiratory variability, suggesting right atrial pressure of 8 mmHg. FINDINGS  Left Ventricle: Left ventricular ejection fraction, by estimation, is 65 to 70%. The left ventricle has normal function. The left ventricle has no regional wall motion abnormalities. Definity contrast agent was given IV to delineate the left ventricular  endocardial borders. The left ventricular internal cavity size was normal in size. There is mild left ventricular hypertrophy. Left ventricular diastolic parameters are consistent with Grade I diastolic dysfunction (impaired relaxation). Right Ventricle: The right ventricular size is mildly enlarged. Right ventricular systolic function is normal. There is mildly elevated pulmonary artery systolic pressure. The tricuspid regurgitant velocity is 2.91 m/s, and with an assumed right atrial pressure of 8 mmHg, the estimated right ventricular systolic pressure is 41.9 mmHg. Left Atrium: Left atrial size was normal in size. Right Atrium: Right atrial size was normal in size. Pericardium: There is no evidence of pericardial effusion. Mitral Valve: The mitral valve is grossly normal. No evidence of mitral valve regurgitation. Tricuspid Valve: Tricuspid valve regurgitation is mild. Aortic Valve: The aortic valve was not well visualized. Aortic valve regurgitation is  not visualized. Aortic valve mean gradient measures 6.0 mmHg. Aortic valve peak gradient measures 12.7 mmHg. Aortic valve area, by VTI measures 2.10 cm. Pulmonic Valve: Pulmonic valve regurgitation is not visualized. Aorta: The aortic root and ascending aorta are structurally normal, with no evidence of dilitation. Venous: The inferior vena cava is normal in size with less than 50% respiratory variability, suggesting right atrial pressure of 8 mmHg. IAS/Shunts: No atrial level shunt detected by color flow Doppler.  LEFT VENTRICLE PLAX 2D LVIDd:         3.00 cm   Diastology LVIDs:         1.65 cm   LV e' medial:    7.62 cm/s LV PW:         1.10 cm   LV E/e' medial:  9.1 LV IVS:        1.10 cm   LV e' lateral:   7.94 cm/s LVOT diam:     1.90 cm  LV E/e' lateral: 8.7 LV SV:         51 LV SV Index:   30 LVOT Area:     2.84 cm  RIGHT VENTRICLE             IVC RV Basal diam:  4.40 cm     IVC diam: 2.10 cm RV S prime:     28.70 cm/s TAPSE (M-mode): 2.7 cm LEFT ATRIUM             Index        RIGHT ATRIUM           Index LA diam:        2.60 cm 1.52 cm/m   RA Area:     13.00 cm LA Vol (A2C):   12.6 ml 7.39 ml/m   RA Volume:   24.30 ml  14.25 ml/m LA Vol (A4C):   18.6 ml 10.91 ml/m LA Biplane Vol: 15.5 ml 9.09 ml/m  AORTIC VALVE AV Area (Vmax):    2.05 cm AV Area (Vmean):   1.93 cm AV Area (VTI):     2.10 cm AV Vmax:           178.00 cm/s AV Vmean:          111.000 cm/s AV VTI:            0.244 m AV Peak Grad:      12.7 mmHg AV Mean Grad:      6.0 mmHg LVOT Vmax:         129.00 cm/s LVOT Vmean:        75.500 cm/s LVOT VTI:          0.181 m LVOT/AV VTI ratio: 0.74  AORTA Ao Root diam: 3.00 cm MITRAL VALVE               TRICUSPID VALVE MV Area (PHT): 3.83 cm    TR Peak grad:   33.9 mmHg MV Decel Time: 198 msec    TR Vmax:        291.00 cm/s MV E velocity: 69.20 cm/s MV A velocity: 69.70 cm/s  SHUNTS MV E/A ratio:  0.99        Systemic VTI:  0.18 m                            Systemic Diam: 1.90 cm Carolan Clines  Electronically signed by Carolan Clines Signature Date/Time: 01/02/2023/11:36:22 AM    Final    DG CHEST PORT 1 VIEW  Result Date: 01/01/2023 CLINICAL DATA:  Hypoxia. EXAM: PORTABLE CHEST 1 VIEW COMPARISON:  12/31/2022 and older studies.  CT, 03/02/2020. FINDINGS: Cardiac silhouette normal in size.  No mediastinal or hilar masses. Lungs are hyperexpanded. There is chronic interstitial thickening most evident in the lower lungs. There is relative lucency in the upper lungs, right greater than left, consistent with emphysema. No lung consolidation. No evidence of edema. No pleural effusion or pneumothorax. Skeletal structures are grossly intact. IMPRESSION: 1. No acute cardiopulmonary disease. 2. Chronic lung changes consistent with emphysema and chronic interstitial thickening. Electronically Signed   By: Amie Portland M.D.   On: 01/01/2023 14:44      Signature  -   Susa Raring M.D on 01/05/2023 at 10:09 AM   -  To page go to [email or web address]

## 2023-01-05 NOTE — Progress Notes (Signed)
Occupational Therapy Treatment Patient Details Name: [name] [name] MRN: [id number] DOB: 01/06/1951 Today's Date: 01/05/2023   History of present illness Pt is a 72 y/o female admitted 11/27 for acute respiratory failure with hypoemia in setting of COPD exacerbation. On 11/28, pt with increasing difficulty breathing requiring BiPAP and transfer to ICU. Recent ED visit 11/22 due to SOB in setting of URI; treated with IV steroids and antibiotics then discharged home. PMH: COPD, anxiety, migraine, HLD   OT comments  Pt making steady progress towards OT goals. Pt able to tolerate UE HEP education on RA with SpO2 >93% and 1/4 DOE. Provided written HEP in Spanish and educated on breathing techniques during activities. Continue to anticipate no OT needs at DC.      If plan is discharge home, recommend the following:  Other (comment) (PRN)   Equipment Recommendations  None recommended by OT    Recommendations for Other Services      Precautions / Restrictions Precautions Precautions: Fall;Other (comment) Precaution Comments: monitor O2 Restrictions Weight Bearing Restrictions: No       Mobility Bed Mobility               General bed mobility comments: in recliner    Transfers                         Balance Overall balance assessment: Needs assistance Sitting-balance support: Feet supported, No upper extremity supported Sitting balance-Leahy Scale: Normal                                     ADL either performed or assessed with clinical judgement   ADL Overall ADL's : Needs assistance/impaired                                       General ADL Comments: Focus on UE HEP education w/ breathing techniques    Extremity/Trunk Assessment Upper Extremity Assessment Upper Extremity Assessment: Right hand dominant;Generalized weakness   Lower Extremity Assessment Lower Extremity Assessment: Defer to PT evaluation        Vision    Vision Assessment?: No apparent visual deficits   Perception     Praxis      Cognition Arousal: Alert Behavior During Therapy: WFL for tasks assessed/performed Overall Cognitive Status: Within Functional Limits for tasks assessed                                          Exercises Exercises: General Upper Extremity General Exercises - Upper Extremity Shoulder Flexion: Strengthening, Left, 15 reps, Theraband Theraband Level (Shoulder Flexion): Level 1 (Yellow) Shoulder Horizontal ABduction: Strengthening, Both, 15 reps, Theraband Theraband Level (Shoulder Horizontal Abduction): Level 2 (Red), Level 1 (Yellow) Elbow Flexion: Strengthening, Both, 15 reps, Theraband Theraband Level (Elbow Flexion): Level 1 (Yellow) Elbow Extension: Strengthening, Both, 15 reps, Theraband Theraband Level (Elbow Extension): Level 1 (Yellow)    Shoulder Instructions       General Comments SpO2 95% on RA with exercises    Pertinent Vitals/ Pain       Pain Assessment Pain Assessment: No/denies pain  Home Living  Prior Functioning/Environment              Frequency  Min 1X/week        Progress Toward Goals  OT Goals(current goals can now be found in the care plan section)  Progress towards OT goals: Progressing toward goals  Acute Rehab OT Goals Patient Stated Goal: completely wean off of O2, get back to normal OT Goal Formulation: With patient Time For Goal Achievement: 01/16/23 Potential to Achieve Goals: Good ADL Goals Pt/caregiver will Perform Home Exercise Program: Increased strength;Both right and left upper extremity;With theraband;Independently;With written HEP provided Additional ADL Goal #1: Pt to verbalize at least 3 energy conservation strategies to use during ADLs/IADLs Additional ADL Goal #2: Pt to increase standing activity tolerance > 15 min without seated rest break  Plan       Co-evaluation                 AM-PAC OT "6 Clicks" Daily Activity     Outcome Measure   Help from another person eating meals?: None Help from another person taking care of personal grooming?: A Little Help from another person toileting, which includes using toliet, bedpan, or urinal?: A Little Help from another person bathing (including washing, rinsing, drying)?: A Little Help from another person to put on and taking off regular upper body clothing?: A Little Help from another person to put on and taking off regular lower body clothing?: A Little 6 Click Score: 19    End of Session    OT Visit Diagnosis: Other (comment) (decreased cardiopulmonary tolerance)   Activity Tolerance Patient tolerated treatment well   Patient Left in chair;with call bell/phone within reach   Nurse Communication Other (comment) (pt inquiring about MyChart access)        Time: 0981-1914 OT Time Calculation (min): 21 min  Charges: OT General Charges $OT Visit: 1 Visit OT Treatments $Therapeutic Exercise: 8-22 mins  Bradd Canary, OTR/L Acute Rehab Services Office: [phone]   Lorre Munroe 01/05/2023, 2:31 PM

## 2023-01-05 NOTE — Progress Notes (Signed)
Nutrition Follow-up  DOCUMENTATION CODES:   Non-severe (moderate) malnutrition in context of acute illness/injury  INTERVENTION:  Modify diet to Dysphagia 3 (easy to chew) due to significant dyspnea with minimal exertion, fatigue. Eating only liquids, soft foods currently   Ensure Enlive po TID, each supplement provides 350 kcal and 20 grams of protein   NUTRITION DIAGNOSIS:   Moderate Malnutrition related to acute illness as evidenced by energy intake < 75% for > 7 days, mild muscle depletion.    GOAL:   Patient will meet greater than or equal to 90% of their needs    MONITOR:   PO intake, Supplement acceptance, Labs, Weight trends  REASON FOR ASSESSMENT:   Consult Assessment of nutrition requirement/status  ASSESSMENT:   72 y/o female presented to the ED with one day history of shortness of breath after being exposed to grandson with URI.   PMH: COPD, anxiety, migraine, HLD, anxiety, COVID-19 x2, diverticulitis, dyspnea, tobacco use disorder.  Pt setting up in chair at time of visit. Stated that she has hard time eating because she either gets full or tired due to breathing difficulties. She reported that she is leaving tomorrow. Has ordered the vegetable soup for the last three meals and has eaten all of it.  She is drinking her supplements also.  Discussed home routine of small frequent meals to help with meeting needs. She was in agreement with this. Reported last BM was about 2 days ago. She expects to go home tomorrow.   Admit weight: 72.1 kg Current weight: n/a     Average Meal Intake: No current documentation.   Nutritionally Relevant Medications: Scheduled Meds:  (feeding supplement) PROSource Plus  30 mL Oral BID BM   amLODipine  5 mg Oral Daily   calcium carbonate  1 tablet Oral Q breakfast   cyanocobalamin  1,000 mcg Oral Daily   feeding supplement  237 mL Oral TID BM   loratadine  10 mg Oral Daily     Labs Reviewed    NUTRITION - FOCUSED  PHYSICAL EXAM:  Flowsheet Row Most Recent Value  Orbital Region No depletion  Upper Arm Region No depletion  Thoracic and Lumbar Region No depletion  Buccal Region No depletion  Temple Region Mild depletion  Clavicle Bone Region Mild depletion  Clavicle and Acromion Bone Region Mild depletion  Scapular Bone Region Mild depletion  Dorsal Hand Mild depletion  Patellar Region Unable to assess  Anterior Thigh Region Unable to assess  Posterior Calf Region Unable to assess  Edema (RD Assessment) Mild       Diet Order:   Diet Order             DIET DYS 3 Room service appropriate? Yes with Assist; Fluid consistency: Thin  Diet effective now                   EDUCATION NEEDS:   Education needs have been addressed  Skin:  Skin Assessment: Reviewed RN Assessment  Last BM:  01/01/23  Height:   Ht Readings from Last 1 Encounters:  01/01/23 5' 0.6" (1.539 m)    Weight:   Wt Readings from Last 1 Encounters:  01/01/23 72.1 kg    Ideal Body Weight:  48.9 kg  BMI:  Body mass index is 30.43 kg/m.  Estimated Nutritional Needs:   Kcal:  1500-1750 kcal/day  Protein:  64-73 gm/day  Fluid:  1500-1750 mL/day    Jamelle Haring RDN, LDN Clinical Dietitian  RDN pager # available  on Amion

## 2023-01-05 NOTE — Progress Notes (Signed)
Physical Therapy Treatment Patient Details Name: [name] [name] MRN: [id number] DOB: 03-Sep-1950 Today's Date: 01/05/2023   History of Present Illness Pt is a 72 y/o female admitted 11/27 for acute respiratory failure with hypoemia in setting of COPD exacerbation. On 11/28, pt with increasing difficulty breathing requiring BiPAP and transfer to ICU. Recent ED visit 11/22 due to SOB in setting of URI; treated with IV steroids and antibiotics then discharged home. PMH: COPD, anxiety, migraine, HLD    PT Comments  Pt received sitting in the recliner and agreeable to session. Pt reports DOE with activity limiting activity tolerance. Pt able to tolerate increased gait distance on RA with 3 standing rest breaks this session. SpO2 remains mostly WFL on RA with occasional drops to 86% with quick recovery with standing rest and pursed lip breathing. Pt demonstrates good O2 sat monitoring and use of pursed lip breathing throughout gait trial. Education provided on activity progression for improved endurance. Pt continues to benefit from PT services to progress toward functional mobility goals.    If plan is discharge home, recommend the following: A little help with walking and/or transfers;Assistance with cooking/housework;Assist for transportation;Help with stairs or ramp for entrance   Can travel by private vehicle        Equipment Recommendations       Recommendations for Other Services       Precautions / Restrictions Precautions Precautions: Fall;Other (comment) Precaution Comments: monitor O2 Restrictions Weight Bearing Restrictions: No     Mobility  Bed Mobility               General bed mobility comments: in recliner at beginning and end of session    Transfers Overall transfer level: Needs assistance Equipment used: None Transfers: Sit to/from Stand Sit to Stand: Supervision           General transfer comment: STS from recliner with supervision for safety     Ambulation/Gait Ambulation/Gait assistance: Supervision Gait Distance (Feet): 250 Feet Assistive device: Rolling walker (2 wheels) Gait Pattern/deviations: Step-through pattern, Decreased stride length       General Gait Details: Pt able to walk around the bed with single UE support on footrail. Pt requests to use RW for ambulation for energy conservation. Pt demonstrates slow, steady gait with light RW support      Balance Overall balance assessment: Needs assistance Sitting-balance support: Feet supported, No upper extremity supported Sitting balance-Leahy Scale: Good     Standing balance support: No upper extremity supported, During functional activity, Bilateral upper extremity supported Standing balance-Leahy Scale: Fair Standing balance comment: with and without RW support                            Cognition Arousal: Alert Behavior During Therapy: WFL for tasks assessed/performed Overall Cognitive Status: Within Functional Limits for tasks assessed                                          Exercises      General Comments General comments (skin integrity, edema, etc.): SpO2 98% at rest on RA and mostly 89-92% on RA during ambulation with occasional drops as low as 86%, however quickly recovers with standing rest and pursed lip breathing      Pertinent Vitals/Pain Pain Assessment Pain Assessment: No/denies pain     PT Goals (current goals can now  be found in the care plan section) Acute Rehab PT Goals Patient Stated Goal: get well, feel strong PT Goal Formulation: With patient Time For Goal Achievement: 01/16/23 Progress towards PT goals: Progressing toward goals    Frequency    Min 1X/week       AM-PAC PT "6 Clicks" Mobility   Outcome Measure  Help needed turning from your back to your side while in a flat bed without using bedrails?: None Help needed moving from lying on your back to sitting on the side of a flat bed  without using bedrails?: A Little Help needed moving to and from a bed to a chair (including a wheelchair)?: A Little Help needed standing up from a chair using your arms (e.g., wheelchair or bedside chair)?: A Little Help needed to walk in hospital room?: A Little Help needed climbing 3-5 steps with a railing? : A Little 6 Click Score: 19    End of Session   Activity Tolerance: Patient tolerated treatment well;Patient limited by fatigue Patient left: in chair;with call bell/phone within reach Nurse Communication: Mobility status PT Visit Diagnosis: Other abnormalities of gait and mobility (R26.89);Muscle weakness (generalized) (M62.81)     Time: 9562-1308 PT Time Calculation (min) (ACUTE ONLY): 17 min  Charges:    $Gait Training: 8-22 mins PT General Charges $$ ACUTE PT VISIT: 1 Visit                     Johny Shock, PTA Acute Rehabilitation Services Secure Chat Preferred  [phone]    Johny Shock 01/05/2023, 9:48 AM

## 2023-01-06 ENCOUNTER — Other Ambulatory Visit (HOSPITAL_COMMUNITY): Payer: Self-pay

## 2023-01-06 DIAGNOSIS — J9601 Acute respiratory failure with hypoxia: Secondary | ICD-10-CM | POA: Diagnosis not present

## 2023-01-06 LAB — BASIC METABOLIC PANEL
Anion gap: 7 (ref 5–15)
BUN: 22 mg/dL (ref 8–23)
CO2: 32 mmol/L (ref 22–32)
Calcium: 9.1 mg/dL (ref 8.9–10.3)
Chloride: 99 mmol/L (ref 98–111)
Creatinine, Ser: 0.65 mg/dL (ref 0.44–1.00)
GFR, Estimated: >60 mL/min (ref >60)
Glucose, Bld: 85 mg/dL (ref 70–99)
Potassium: 3.7 mmol/L (ref 3.5–5.1)
Sodium: 138 mmol/L (ref 135–145)

## 2023-01-06 LAB — CBC WITH DIFFERENTIAL/PLATELET
Abs Immature Granulocytes: 0.11 10*3/uL — ABNORMAL HIGH (ref 0.00–0.07)
Basophils Absolute: 0 10*3/uL (ref 0.0–0.1)
Basophils Relative: 0 %
Eosinophils Absolute: 0 10*3/uL (ref 0.0–0.5)
Eosinophils Relative: 0 %
HCT: 40.9 % (ref 36.0–46.0)
Hemoglobin: 13.3 g/dL (ref 12.0–15.0)
Immature Granulocytes: 1 %
Lymphocytes Relative: 15 %
Lymphs Abs: 1.9 10*3/uL (ref 0.7–4.0)
MCH: 27.7 pg (ref 26.0–34.0)
MCHC: 32.5 g/dL (ref 30.0–36.0)
MCV: 85 fL (ref 80.0–100.0)
Monocytes Absolute: 0.8 10*3/uL (ref 0.1–1.0)
Monocytes Relative: 6 %
Neutro Abs: 9.6 10*3/uL — ABNORMAL HIGH (ref 1.7–7.7)
Neutrophils Relative %: 78 %
Platelets: 327 10*3/uL (ref 150–400)
RBC: 4.81 MIL/uL (ref 3.87–5.11)
RDW: 13 % (ref 11.5–15.5)
WBC: 12.5 10*3/uL — ABNORMAL HIGH (ref 4.0–10.5)
nRBC: 0 % (ref 0.0–0.2)

## 2023-01-06 LAB — MAGNESIUM: Magnesium: 2.1 mg/dL (ref 1.7–2.4)

## 2023-01-06 LAB — C-REACTIVE PROTEIN: CRP: <0.5 mg/dL (ref <1.0)

## 2023-01-06 LAB — BRAIN NATRIURETIC PEPTIDE: B Natriuretic Peptide: 21.5 pg/mL (ref 0.0–100.0)

## 2023-01-06 MED ORDER — FUROSEMIDE 10 MG/ML IJ SOLN
40.0000 mg | Freq: Once | INTRAMUSCULAR | Status: AC
  Administered 2023-01-06: 40 mg via INTRAVENOUS
  Filled 2023-01-06: qty 4

## 2023-01-06 MED ORDER — FUROSEMIDE 40 MG PO TABS
ORAL_TABLET | ORAL | 0 refills | Status: DC
  Filled 2023-01-06: qty 20, 20d supply, fill #0

## 2023-01-06 MED ORDER — PREDNISONE 5 MG PO TABS
ORAL_TABLET | ORAL | 0 refills | Status: DC
  Filled 2023-01-06: qty 4, 3d supply, fill #0
  Filled 2023-01-06: qty 61, 15d supply, fill #0
  Filled 2023-01-06: qty 65, 18d supply, fill #0

## 2023-01-06 MED ORDER — POTASSIUM CHLORIDE CRYS ER 20 MEQ PO TBCR
40.0000 meq | EXTENDED_RELEASE_TABLET | Freq: Once | ORAL | Status: AC
  Administered 2023-01-06: 40 meq via ORAL
  Filled 2023-01-06: qty 2

## 2023-01-06 MED ORDER — FUROSEMIDE 10 MG/ML IJ SOLN: 20.0000 mg | Freq: Once | INTRAMUSCULAR | Status: DC

## 2023-01-06 NOTE — Plan of Care (Signed)

## 2023-01-06 NOTE — Progress Notes (Signed)
CoxAnnabelle Harman, RN  Registered Nurse   Progress Notes    Signed   Date of Service: 01/06/2023  8:46 AM   Signed      SATURATION QUALIFICATIONS: (This note is used to comply with regulatory documentation for home oxygen)   Patient Saturations on Room Air at Rest =91%   Patient Saturations on Room Air while Ambulating =82%   Patient Saturations on 2 Liters of oxygen while Ambulating = 96%   Please briefly explain why patient needs home oxygen: patient will need home oxygen to maintain an adequate O2 saturation

## 2023-01-06 NOTE — TOC Transition Note (Signed)
Transition of Care [facility]) - CM/SW Discharge Note   Patient Details  Name: [name] [name] MRN: [id number] Date of Birth: December 23, 1950  Transition of Care [facility]) CM/SW Contact:  Harriet Masson, RN Phone Number: 01/06/2023, 9:34 AM   Clinical Narrative:     Orders for home 02. Spoke to patient regarding transition needs with phone spanish interpreter.   Patient is agreeable to use in house provider, [employer], for DME needs.  Notified Mitch with [employer] of home 02 order.   Address, phone number and PCP confirmed.  Patient's sister will transport home.         Patient Goals and CMS Choice     Return home  Discharge Placement               home          Discharge Plan and Services Additional resources added to the After Visit Summary for                  DME Arranged: Oxygen DME Agency: [employer] Date DME Agency Contacted: 01/06/23 Time DME Agency Contacted: [phone] Representative spoke with at DME Agency: [facility] Agency: NA        Social Determinants of Health (SDOH) Interventions SDOH Screenings   Food Insecurity: Food Insecurity Present (01/01/2023)  Housing: Low Risk  (01/01/2023)  Transportation Needs: No Transportation Needs (01/01/2023)  Utilities: Not At Risk (01/01/2023)  Depression (PHQ2-9): Low Risk  (10/16/2022)  Social Connections: Unknown (06/17/2021)   Received from [facility]  Tobacco Use: Medium Risk (01/01/2023)     Readmission Risk Interventions    01/06/2023    9:33 AM  Readmission Risk Prevention Plan  Post Dischage Appt Complete  Medication Screening Complete  Transportation Screening Complete

## 2023-01-06 NOTE — Discharge Instructions (Signed)
Follow with Primary MD Milian, [name], FNP in 7 days   Get CBC, CMP, 2 view Chest X ray -  checked next visit with your primary MD   Activity: As tolerated with Full fall precautions use walker/cane & assistance as needed  Disposition Home   Diet: Heart Healthy   Check your Weight same time everyday, if you gain over 2 pounds, or you develop in leg swelling, experience more shortness of breath or chest pain, call your Primary MD immediately. Follow Cardiac Low Salt Diet and 1.5 lit/day fluid restriction.  Special Instructions: If you have smoked or chewed Tobacco  in the last 2 yrs please stop smoking, stop any regular Alcohol  and or any Recreational drug use.  On your next visit with your primary care physician please Get Medicines reviewed and adjusted.  Please request your Prim.MD to go over all Hospital Tests and Procedure/Radiological results at the follow up, please get all Hospital records sent to your Prim MD by signing hospital release before you go home.  If you experience worsening of your admission symptoms, develop shortness of breath, life threatening emergency, suicidal or homicidal thoughts you must seek medical attention immediately by calling 911 or calling your MD immediately  if symptoms less severe.  You Must read complete instructions/literature along with all the possible adverse reactions/side effects for all the Medicines you take and that have been prescribed to you. Take any new Medicines after you have completely understood and accpet all the possible adverse reactions/side effects.   Do not drive when taking Pain medications.  Do not take more than prescribed Pain, Sleep and Anxiety Medications

## 2023-01-06 NOTE — Progress Notes (Signed)
SATURATION QUALIFICATIONS: (This note is used to comply with regulatory documentation for home oxygen)  Patient Saturations on Room Air at Rest =91%  Patient Saturations on Room Air while Ambulating =82%  Patient Saturations on 2 Liters of oxygen while Ambulating = 96%  Please briefly explain why patient needs home oxygen: patient will need home oxygen to maintain an adequate O2 saturation

## 2023-01-06 NOTE — Discharge Summary (Signed)
[name] [name] OZH:[id number] DOB: [birth year] DOA: 12/31/2022  PCP: Arrie Senate, FNP  Admit date: 12/31/2022  Discharge date: 01/06/2023  Admitted From: Home   Disposition:  Home   Recommendations for Outpatient Follow-up:   Follow up with PCP in 1-2 weeks  PCP Please obtain BMP/CBC, 2 view CXR in 1week,  (see Discharge instructions)   PCP Please follow up on the following pending results: Monitor fluid status and electrolytes closely, needs close outpatient follow-up with pulmonary.   Home Health: None   Equipment/Devices: o2  Consultations: None  Discharge Condition: Stable    CODE STATUS: Full    Diet Recommendation: Heart Healthy with 1.5 L fluid restriction per day.    Chief Complaint  Patient presents with   Shortness of Breath     Brief history of present illness from the day of admission and additional interim summary    72 y.o. female past medical history of COPD and upper respiratory tract infection about 2 weeks ago, smoker who quit earlier this year in 2024, Comes in with worsening shortness of breath                                                                  Hospital Course   Acute respiratory failure with hypoxemia secondary to COPD with acute exacerbation (HCC)/metapneumovirus    Admitted under PCCM and started on IV steroids inhalers, initially required BiPAP currently on nasal cannula oxygen, echo noted with grade 1 diastolic dysfunction, preserved EF of 65%.  Also on empiric IV antibiotics as she had leukocytosis and there was suspicion of bacterial community-acquired pneumonia, finished antibiotic treatment.  She was encouraged to sit in chair use I-S and flutter valve for pulmonary toiletry.  With treatment with steroids, antibiotic course, nebulizer treatments she is  much better, she does qualify for home oxygen which she will get, she will be discharged on a steroid taper along with as needed Lasix for weight above 2.5 pounds from her baseline.  Will follow with PCP and pulmonologist on a close basis.  Currently symptom-free on 2 L nasal cannula oxygen and feeling a whole lot better, eager to go home.   SpO2: 96 % O2 Flow Rate (L/min): 2 L/min FiO2 (%): 30 %   Pure hypercholesterolemia  Continue statins.   Malnutrition of moderate degree - Noted.  Placed on protein supplementation here.    Discharge diagnosis     Principal Problem:   Acute respiratory failure with hypoxemia (HCC) Active Problems:   COPD with acute exacerbation (HCC)   Pure hypercholesterolemia   Acute exacerbation of chronic obstructive pulmonary disease (COPD) (HCC)   Malnutrition of moderate degree    Discharge instructions    Discharge Instructions     Discharge instructions   Complete by: As directed  Follow with Primary MD Milian, [name], FNP in 7 days   Get CBC, CMP, 2 view Chest X ray -  checked next visit with your primary MD   Activity: As tolerated with Full fall precautions use walker/cane & assistance as needed  Disposition Home   Diet: Heart Healthy   Check your Weight same time everyday, if you gain over 2 pounds, or you develop in leg swelling, experience more shortness of breath or chest pain, call your Primary MD immediately. Follow Cardiac Low Salt Diet and 1.5 lit/day fluid restriction.  Special Instructions: If you have smoked or chewed Tobacco  in the last 2 yrs please stop smoking, stop any regular Alcohol  and or any Recreational drug use.  On your next visit with your primary care physician please Get Medicines reviewed and adjusted.  Please request your Prim.MD to go over all Hospital Tests and Procedure/Radiological results at the follow up, please get all Hospital records sent to your Prim MD by signing hospital release before you  go home.  If you experience worsening of your admission symptoms, develop shortness of breath, life threatening emergency, suicidal or homicidal thoughts you must seek medical attention immediately by calling 911 or calling your MD immediately  if symptoms less severe.  You Must read complete instructions/literature along with all the possible adverse reactions/side effects for all the Medicines you take and that have been prescribed to you. Take any new Medicines after you have completely understood and accpet all the possible adverse reactions/side effects.   Do not drive when taking Pain medications.  Do not take more than prescribed Pain, Sleep and Anxiety Medications   Increase activity slowly   Complete by: As directed        Discharge Medications   Allergies as of 01/06/2023   No Known Allergies      Medication List     STOP taking these medications    ibuprofen 200 MG tablet Commonly known as: ADVIL   predniSONE 10 MG tablet Commonly known as: DELTASONE Replaced by: predniSONE 5 MG tablet       TAKE these medications    albuterol 108 (90 Base) MCG/ACT inhaler Commonly known as: VENTOLIN HFA INHALE 2 PUFFS BY MOUTH EVERY 4 HOURS AS NEEDED ONLY IF YOU CANT CANNOT CATCH YOUR BREATH   albuterol (2.5 MG/3ML) 0.083% nebulizer solution Commonly known as: PROVENTIL Take 3 mLs (2.5 mg total) by nebulization every 4 (four) hours as needed for wheezing or shortness of breath.   Breztri Aerosphere 160-9-4.8 MCG/ACT Aero Generic drug: Budeson-Glycopyrrol-Formoterol Inhale 2 puffs into the lungs in the morning and at bedtime.   CALCIUM 600 PO Take 1 tablet by mouth daily.   calcium carbonate 500 MG chewable tablet Commonly known as: TUMS - dosed in mg elemental calcium Chew 1 tablet by mouth 3 (three) times daily.   carboxymethylcellulose 0.5 % Soln Commonly known as: REFRESH PLUS 1 drop 3 (three) times daily as needed.   cetirizine 10 MG tablet Commonly known  as: ZYRTEC Take 1 tablet (10 mg total) by mouth daily. What changed:  when to take this reasons to take this   CYANOCOBALAMIN PO Take 1 tablet by mouth daily.   Ergocalciferol 50 MCG (2000 UT) Caps Take by mouth.   furosemide 40 MG tablet Commonly known as: Lasix Take 1 pill daily as needed if weight more than 2.5 pounds above your baseline weight.   ipratropium-albuterol 0.5-2.5 (3) MG/3ML Soln Commonly known as: DUONEB INHALE 1 NEBULE  VIA NEBULIZER EVERY 6 HOURS AS NEEDED   predniSONE 5 MG tablet Commonly known as: DELTASONE Label  & dispense according to the schedule below. take 8 Pills PO for 3 days, 6 Pills PO for 3 days, 4 Pills PO for 3 days, 2 Pills PO for 3 days, 1 Pills PO for 3 days, 1/2 Pill  PO for 3 days then STOP. Total 65 pills. Replaces: predniSONE 10 MG tablet               Durable Medical Equipment  (From admission, onward)           Start     Ordered   01/06/23 0754  For home use only DME oxygen  Once       Question Answer Comment  Length of Need 6 Months   Mode or (Route) Nasal cannula   Liters per Minute 2   Frequency Continuous (stationary and portable oxygen unit needed)   Oxygen conserving device Yes   Oxygen delivery system Gas      01/06/23 0754             Follow-up Information     Milian, [name], FNP. Schedule an appointment as soon as possible for a visit in 1 week(s).   Specialty: Family Medicine Contact information: [address] [place] [address] [phone]         Steffanie Dunn, DO. Schedule an appointment as soon as possible for a visit in 1 week(s).   Specialty: Pulmonary Disease Why: COPD Contact information: [address] [place] [address] [phone]                 Major procedures and Radiology Reports - PLEASE review detailed and final reports thoroughly  -      ECHOCARDIOGRAM COMPLETE  Result Date: 01/02/2023    ECHOCARDIOGRAM REPORT   Patient  Name:   [name] [name] Date of Exam: 01/02/2023 Medical Rec #:  [id number]      Height:       60.6 in Accession #:    [id number]     Weight:       159.0 lb Date of Birth:  12-05-1950      BSA:          1.705 m Patient Age:    72 years       BP:           101/55 mmHg Patient Gender: F              HR:           102 bpm. Exam Location:  Inpatient Procedure: 2D Echo, Cardiac Doppler, Color Doppler and Intracardiac            Opacification Agent Indications:    CHF, Acute Diastolic  History:        Patient has prior history of Echocardiogram examinations, most                 recent 12/19/2015. COPD.  Sonographer:    Karma Ganja Referring Phys: Cheri Fowler  Sonographer Comments: Technically challenging study due to limited acoustic windows. Image acquisition challenging due to respiratory motion. IMPRESSIONS  1. Left ventricular ejection fraction, by estimation, is 65 to 70%. The left ventricle has normal function. The left ventricle has no regional wall motion abnormalities. There is mild left ventricular hypertrophy. Left ventricular diastolic parameters are consistent with Grade I diastolic dysfunction (impaired relaxation).  2. Right ventricular  systolic function is normal. The right ventricular size is mildly enlarged. There is mildly elevated pulmonary artery systolic pressure.  3. The mitral valve is grossly normal. No evidence of mitral valve regurgitation.  4. The aortic valve was not well visualized. Aortic valve regurgitation is not visualized.  5. The inferior vena cava is normal in size with <50% respiratory variability, suggesting right atrial pressure of 8 mmHg. FINDINGS  Left Ventricle: Left ventricular ejection fraction, by estimation, is 65 to 70%. The left ventricle has normal function. The left ventricle has no regional wall motion abnormalities. Definity contrast agent was given IV to delineate the left ventricular  endocardial borders. The left ventricular internal cavity size was normal in size.  There is mild left ventricular hypertrophy. Left ventricular diastolic parameters are consistent with Grade I diastolic dysfunction (impaired relaxation). Right Ventricle: The right ventricular size is mildly enlarged. Right ventricular systolic function is normal. There is mildly elevated pulmonary artery systolic pressure. The tricuspid regurgitant velocity is 2.91 m/s, and with an assumed right atrial pressure of 8 mmHg, the estimated right ventricular systolic pressure is 41.9 mmHg. Left Atrium: Left atrial size was normal in size. Right Atrium: Right atrial size was normal in size. Pericardium: There is no evidence of pericardial effusion. Mitral Valve: The mitral valve is grossly normal. No evidence of mitral valve regurgitation. Tricuspid Valve: Tricuspid valve regurgitation is mild. Aortic Valve: The aortic valve was not well visualized. Aortic valve regurgitation is not visualized. Aortic valve mean gradient measures 6.0 mmHg. Aortic valve peak gradient measures 12.7 mmHg. Aortic valve area, by VTI measures 2.10 cm. Pulmonic Valve: Pulmonic valve regurgitation is not visualized. Aorta: The aortic root and ascending aorta are structurally normal, with no evidence of dilitation. Venous: The inferior vena cava is normal in size with less than 50% respiratory variability, suggesting right atrial pressure of 8 mmHg. IAS/Shunts: No atrial level shunt detected by color flow Doppler.  LEFT VENTRICLE PLAX 2D LVIDd:         3.00 cm   Diastology LVIDs:         1.65 cm   LV e' medial:    7.62 cm/s LV PW:         1.10 cm   LV E/e' medial:  9.1 LV IVS:        1.10 cm   LV e' lateral:   7.94 cm/s LVOT diam:     1.90 cm   LV E/e' lateral: 8.7 LV SV:         51 LV SV Index:   30 LVOT Area:     2.84 cm  RIGHT VENTRICLE             IVC RV Basal diam:  4.40 cm     IVC diam: 2.10 cm RV S prime:     28.70 cm/s TAPSE (M-mode): 2.7 cm LEFT ATRIUM             Index        RIGHT ATRIUM           Index LA diam:        2.60 cm 1.52  cm/m   RA Area:     13.00 cm LA Vol (A2C):   12.6 ml 7.39 ml/m   RA Volume:   24.30 ml  14.25 ml/m LA Vol (A4C):   18.6 ml 10.91 ml/m LA Biplane Vol: 15.5 ml 9.09 ml/m  AORTIC VALVE AV Area (Vmax):    2.05 cm AV Area (Vmean):  1.93 cm AV Area (VTI):     2.10 cm AV Vmax:           178.00 cm/s AV Vmean:          111.000 cm/s AV VTI:            0.244 m AV Peak Grad:      12.7 mmHg AV Mean Grad:      6.0 mmHg LVOT Vmax:         129.00 cm/s LVOT Vmean:        75.500 cm/s LVOT VTI:          0.181 m LVOT/AV VTI ratio: 0.74  AORTA Ao Root diam: 3.00 cm MITRAL VALVE               TRICUSPID VALVE MV Area (PHT): 3.83 cm    TR Peak grad:   33.9 mmHg MV Decel Time: 198 msec    TR Vmax:        291.00 cm/s MV E velocity: 69.20 cm/s MV A velocity: 69.70 cm/s  SHUNTS MV E/A ratio:  0.99        Systemic VTI:  0.18 m                            Systemic Diam: 1.90 cm Carolan Clines Electronically signed by Carolan Clines Signature Date/Time: 01/02/2023/11:36:22 AM    Final    DG CHEST PORT 1 VIEW  Result Date: 01/01/2023 CLINICAL DATA:  Hypoxia. EXAM: PORTABLE CHEST 1 VIEW COMPARISON:  12/31/2022 and older studies.  CT, 03/02/2020. FINDINGS: Cardiac silhouette normal in size.  No mediastinal or hilar masses. Lungs are hyperexpanded. There is chronic interstitial thickening most evident in the lower lungs. There is relative lucency in the upper lungs, right greater than left, consistent with emphysema. No lung consolidation. No evidence of edema. No pleural effusion or pneumothorax. Skeletal structures are grossly intact. IMPRESSION: 1. No acute cardiopulmonary disease. 2. Chronic lung changes consistent with emphysema and chronic interstitial thickening. Electronically Signed   By: Amie Portland M.D.   On: 01/01/2023 14:44   DG Chest Port 1 View  Result Date: 12/31/2022 CLINICAL DATA:  Shortness of breath. EXAM: PORTABLE CHEST 1 VIEW COMPARISON:  12/26/2022 FINDINGS: Slightly improved nonspecific interstitial and  airspace opacities in the mid and lower lungs compared to 12/26/2022. Lucencies in the upper lungs suggestive of bullous change. Hyperinflation. Stable cardiomediastinal silhouette. Aortic atherosclerotic calcification. IMPRESSION: Slightly improved nonspecific interstitial and airspace opacities in the mid and lower lungs compared to 12/26/2022. Advanced emphysema Electronically Signed   By: Minerva Fester M.D.   On: 12/31/2022 21:26   DG Chest Portable 1 View  Result Date: 12/26/2022 CLINICAL DATA:  Shortness of breath.  History of COPD. EXAM: PORTABLE CHEST 1 VIEW COMPARISON:  09/17/2022. FINDINGS: Redemonstration of nonspecific opacities overlying the bilateral mid lower lung zones, grossly similar to multiple prior studies and likely corresponds to combination of underlying fibrosis/scarring and intercostal calcifications. There is sharply marginated lucency obscuring the left lower heart margin, which corresponds to an emphysematous bullae on prior CT scan. There is paucity of bronchovascular markings in the bilateral upper lobes which also corresponds to extensive bullous emphysematous changes. No acute dense consolidation or lung collapse. Bilateral lateral costophrenic angles are clear. Normal cardio-mediastinal silhouette. Aortic arch calcifications noted. No acute osseous abnormalities. The soft tissues are within normal limits. IMPRESSION: *No acute cardiopulmonary abnormality. There are chronic lung parenchymal changes and emphysematous  changes, as described in detail above. Aortic Atherosclerosis (ICD10-I70.0) and Emphysema (ICD10-J43.9). Electronically Signed   By: Jules Schick M.D.   On: 12/26/2022 15:32    Micro Results   Recent Results (from the past 240 hour(s))  Resp panel by RT-PCR (RSV, Flu A&B, Covid) Anterior Nasal Swab     Status: None   Collection Time: 12/31/22  8:25 PM   Specimen: Anterior Nasal Swab  Result Value Ref Range Status   SARS Coronavirus 2 by RT PCR NEGATIVE  NEGATIVE Final   Influenza A by PCR NEGATIVE NEGATIVE Final   Influenza B by PCR NEGATIVE NEGATIVE Final    Comment: (NOTE) The Xpert Xpress SARS-CoV-2/FLU/RSV plus assay is intended as an aid in the diagnosis of influenza from Nasopharyngeal swab specimens and should not be used as a sole basis for treatment. Nasal washings and aspirates are unacceptable for Xpert Xpress SARS-CoV-2/FLU/RSV testing.  Fact Sheet for Patients: BloggerCourse.com  Fact Sheet for Healthcare Providers: SeriousBroker.it  This test is not yet approved or cleared by the [place] FDA and has been authorized for detection and/or diagnosis of SARS-CoV-2 by FDA under an Emergency Use Authorization (EUA). This EUA will remain in effect (meaning this test can be used) for the duration of the COVID-19 declaration under Section 564(b)(1) of the Act, 21 U.S.C. section 360bbb-3(b)(1), unless the authorization is terminated or revoked.     Resp Syncytial Virus by PCR NEGATIVE NEGATIVE Final    Comment: (NOTE) Fact Sheet for Patients: BloggerCourse.com  Fact Sheet for Healthcare Providers: SeriousBroker.it  This test is not yet approved or cleared by the [place] FDA and has been authorized for detection and/or diagnosis of SARS-CoV-2 by FDA under an Emergency Use Authorization (EUA). This EUA will remain in effect (meaning this test can be used) for the duration of the COVID-19 declaration under Section 564(b)(1) of the Act, 21 U.S.C. section 360bbb-3(b)(1), unless the authorization is terminated or revoked.  Performed at [facility], [address], [place], [place] [address]   Respiratory (~20 pathogens) panel by PCR     Status: Abnormal   Collection Time: 01/01/23 10:01 AM   Specimen: Nasopharyngeal Swab; Respiratory  Result Value Ref Range Status   Adenovirus NOT DETECTED NOT DETECTED  Final   Coronavirus 229E NOT DETECTED NOT DETECTED Final    Comment: (NOTE) The Coronavirus on the Respiratory Panel, DOES NOT test for the novel  Coronavirus (2019 nCoV)    Coronavirus HKU1 NOT DETECTED NOT DETECTED Final   Coronavirus NL63 NOT DETECTED NOT DETECTED Final   Coronavirus OC43 NOT DETECTED NOT DETECTED Final   Metapneumovirus DETECTED (A) NOT DETECTED Final   Rhinovirus / Enterovirus NOT DETECTED NOT DETECTED Final   Influenza A NOT DETECTED NOT DETECTED Final   Influenza B NOT DETECTED NOT DETECTED Final   Parainfluenza Virus 1 NOT DETECTED NOT DETECTED Final   Parainfluenza Virus 2 NOT DETECTED NOT DETECTED Final   Parainfluenza Virus 3 NOT DETECTED NOT DETECTED Final   Parainfluenza Virus 4 NOT DETECTED NOT DETECTED Final   Respiratory Syncytial Virus NOT DETECTED NOT DETECTED Final   Bordetella pertussis NOT DETECTED NOT DETECTED Final   Bordetella Parapertussis NOT DETECTED NOT DETECTED Final   Chlamydophila pneumoniae NOT DETECTED NOT DETECTED Final   Mycoplasma pneumoniae NOT DETECTED NOT DETECTED Final    Comment: Performed at [facility], [address], [place], [place] [address]  MRSA Next Gen by PCR, Nasal     Status: None  Collection Time: 01/02/23  5:25 PM   Specimen: Nasal Mucosa; Nasal Swab  Result Value Ref Range Status   MRSA by PCR Next Gen NOT DETECTED NOT DETECTED Final    Comment: (NOTE) The GeneXpert MRSA Assay (FDA approved for NASAL specimens only), is one component of a comprehensive MRSA colonization surveillance program. It is not intended to diagnose MRSA infection nor to guide or monitor treatment for MRSA infections. Test performance is not FDA approved in patients less than 10 years old. Performed at [facility], [address] [place], [place] [address]     Today   Subjective    [name] [name] today has no headache,no chest abdominal pain,no new weakness tingling or numbness, feels much better wants to go home  today.    Objective   Blood pressure 116/66, pulse 82, temperature 98.6 F (37 C), temperature source Oral, resp. rate 11, height 5' 0.6" (1.539 m), weight 72.1 kg, SpO2 97%.  No intake or output data in the 24 hours ending 01/06/23 0804  Exam  Awake Alert, No new F.N deficits,    [place].AT,PERRAL Supple Neck,   Symmetrical Chest wall movement, Good air movement bilaterally, CTAB RRR,No Gallops,   +ve B.Sounds, Abd Soft, Non tender,  No Cyanosis, Clubbing or edema    Data Review   Recent Labs  Lab 12/31/22 2017 12/31/22 2029 01/01/23 0333 01/05/23 0333 01/06/23 0444  WBC 11.0*  --  11.5* 12.3* 12.5*  HGB 14.0 14.3 13.9 13.0 13.3  HCT 42.0 42.0 42.5 40.1 40.9  PLT 390  --  367 358 327  MCV 83.5  --  83.2 85.1 85.0  MCH 27.8  --  27.2 27.6 27.7  MCHC 33.3  --  32.7 32.4 32.5  RDW 12.8  --  12.7 12.9 13.0  LYMPHSABS 1.2  --   --  1.7 1.9  MONOABS 0.6  --   --  0.9 0.8  EOSABS 0.0  --   --  0.0 0.0  BASOSABS 0.0  --   --  0.0 0.0    Recent Labs  Lab 12/31/22 2017 12/31/22 2029 01/01/23 1617 01/02/23 0302 01/03/23 0229 01/04/23 1007 01/05/23 0333 01/06/23 0444  NA 138   < > 137 139 137 139 137 138  K 3.8   < > 3.4* 3.4* 4.4 4.8 3.7 3.7  CL 103  --  97* 100 99 96* 97* 99  CO2 25  --  26 28 29 30  33* 32  ANIONGAP 10  --  14 11 9 13 7 7   GLUCOSE 112*  --  119* 89 136* 131* 84 85  BUN 19  --  21 22 29* 27* 29* 22  CREATININE 0.68   < > 0.63 0.67 0.61 0.61 0.68 0.65  AST 33  --   --   --   --   --   --   --   ALT 34  --   --   --   --   --   --   --   ALKPHOS 64  --   --   --   --   --   --   --   BILITOT 0.9  --   --   --   --   --   --   --   ALBUMIN 3.6  --   --   --   --   --   --   --   CRP  --   --   --   --   --   --  <  0.5 <0.5  PROCALCITON  --   --  <0.10  --   --   --   --   --   BNP 56.7  --   --   --   --  33.1 30.8 21.5  MG  --   --   --   --   --   --  2.2 2.1  CALCIUM 9.2  --  8.3* 8.4* 9.4 9.8 9.0 9.1   < > = values in this interval not  displayed.    Total Time in preparing paper work, data evaluation and todays exam - 35 minutes  Signature  -    Susa Raring M.D on 01/06/2023 at 8:04 AM   -  To page go to [email or web address]

## 2023-01-07 ENCOUNTER — Telehealth: Payer: Self-pay | Admitting: *Deleted

## 2023-01-07 NOTE — Transitions of Care (Post Inpatient/ED Visit) (Signed)
01/07/2023  Name: [name] [name] MRN: [id number] DOB: [birth year]  Today's TOC FU Call Status: Today's TOC FU Call Status:: Successful TOC FU Call Completed TOC FU Call Complete Date: 01/07/23 Patient's Name and Date of Birth confirmed.  Transition Care Management Follow-up Telephone Call Date of Discharge: 01/06/23 Discharge Facility: [facility]) Type of Discharge: Inpatient Admission Primary Inpatient Discharge Diagnosis:: Acute respiratory failure with hypoxemia [facility]) How have you been since you were released from the hospital?: Better (patient's son reports pt is doing well, eating well, taking all medications as prescribed, no concerns voiced) Any questions or concerns?: No (patient's son Roderica Mahin translates for pt) Declined enrollment in 30 day program  Items Reviewed: Did you receive and understand the discharge instructions provided?: Yes Medications obtained,verified, and reconciled?: Yes (Medications Reviewed) Any new allergies since your discharge?: No Dietary orders reviewed?: Yes Type of Diet Ordered:: low sodium heart healthy, 1.5 L fluid restriction Do you have support at home?: Yes People in Home: other relative(s) Name of Support/Comfort Primary Source: daughter in law Dossie Der lives wtih pt  Medications Reviewed Today: Medications Reviewed Today     Reviewed by Audrie Gallus, RN (Registered Nurse) on 01/07/23 at 1131  Med List Status: <None>   Medication Order Taking? Sig Documenting Provider Last Dose Status Informant  albuterol (PROVENTIL) (2.5 MG/3ML) 0.083% nebulizer solution [id number] Yes Take 3 mLs (2.5 mg total) by nebulization every 4 (four) hours as needed for wheezing or shortness of breath. Nyoka Cowden, MD Taking Active Self, Pharmacy Records  albuterol (VENTOLIN HFA) 108 (90 Base) MCG/ACT inhaler [id number] Yes INHALE 2 PUFFS BY MOUTH EVERY 4 HOURS AS NEEDED ONLY IF YOU CANT CANNOT CATCH YOUR BREATH Nyoka Cowden, MD Taking Active  Self, Pharmacy Records           Med Note Nedra Hai, NICOLE   Thu Jan 01, 2023 12:03 AM) Patient ran out yesterday  Budeson-Glycopyrrol-Formoterol (BREZTRI AEROSPHERE) 160-9-4.8 MCG/ACT AERO [id number] Yes Inhale 2 puffs into the lungs in the morning and at bedtime. Nyoka Cowden, MD Taking Active Self, Pharmacy Records  Calcium Carbonate (CALCIUM 600 PO) [id number] Yes Take 1 tablet by mouth daily. [provider] Taking Active Self, Pharmacy Records  calcium carbonate (TUMS - DOSED IN MG ELEMENTAL CALCIUM) 500 MG chewable tablet [id number] Yes Chew 1 tablet by mouth 3 (three) times daily. [provider] Taking Active   carboxymethylcellulose (REFRESH PLUS) 0.5 % SOLN [id number] Yes 1 drop 3 (three) times daily as needed. [provider] Taking Active Self, Pharmacy Records  cetirizine (ZYRTEC) 10 MG tablet [id number] Yes Take 1 tablet (10 mg total) by mouth daily.  Patient taking differently: Take 10 mg by mouth daily as needed for allergies.   Hoy Register, MD Taking Active Self, Pharmacy Records           Med Note Corliss Blacker, [place] A   Fri Mar 02, 2020 10:05 PM) PRN  CYANOCOBALAMIN PO [id number] Yes Take 1 tablet by mouth daily. [provider] Taking Active Self, Pharmacy Records  Ergocalciferol 50 MCG [phone] UT) CAPS [id number] Yes Take by mouth. [provider] Taking Active Self, Pharmacy Records           Med Note Kalman Drape Jul 15, 2022  2:51 PM) Winter time only   furosemide (LASIX) 40 MG tablet [id number] Yes Take 1 pill daily as needed if weight more than 2.5 pounds above your baseline weight. Leroy Sea, MD Taking Active  ipratropium-albuterol (DUONEB) 0.5-2.5 (3) MG/3ML SOLN [id number] Yes INHALE 1 NEBULE VIA NEBULIZER EVERY 6 HOURS AS NEEDED Nyoka Cowden, MD Taking Active Self, Pharmacy Records  predniSONE (DELTASONE) 5 MG tablet [id number] Yes Take 8 tablets by mouth daily for 3 days, 6 tabs daily for 3 days, 4 tabs  daily for 3 days, 2 tabs daily for 3 days, 1 tab daily for 3 days, 1/2 tab daily for 3 days then STOP. Total 65 tablets. Leroy Sea, MD Taking Active             Home Care and Equipment/Supplies: Were Home Health Services Ordered?: No Any new equipment or medical supplies ordered?: Yes Name of Medical supply agency?: [employer]-  pt has oxygen at 2 liters via [place] Were you able to get the equipment/medical supplies?: Yes Do you have any questions related to the use of the equipment/supplies?: No  Functional Questionnaire: Do you need assistance with bathing/showering or dressing?: No (son reports pt is independent with all aspects of her care, pt is able to drive) Do you need assistance with meal preparation?: No Do you need assistance with eating?: No Do you have difficulty maintaining continence: No Do you need assistance with getting out of bed/getting out of a chair/moving?: No Do you have difficulty managing or taking your medications?: No  Follow up appointments reviewed: PCP Follow-up appointment confirmed?: No (son states he will call today and make primary care post hospital appt/ labs/ CXR) MD Provider Line Number:[phone] Given: No Specialist Hospital Follow-up appointment confirmed?: Yes Date of Specialist follow-up appointment?: 02/27/23 Follow-Up Specialty Provider:: pulmonary Dr. Sherene Sires  02/26/22 @ 1130 am Do you need transportation to your follow-up appointment?: No Do you understand care options if your condition(s) worsen?: Yes-patient verbalized understanding  SDOH Interventions Today    Flowsheet Row Most Recent Value  SDOH Interventions   Food Insecurity Interventions Intervention Not Indicated  Housing Interventions Intervention Not Indicated  Transportation Interventions Intervention Not Indicated  Utilities Interventions Intervention Not Indicated       Irving Shows [facility], BSN RN Care Manager/ Transition of Care [place] Population  Health [phone]

## 2023-01-09 ENCOUNTER — Telehealth: Payer: Self-pay | Admitting: Internal Medicine

## 2023-01-09 NOTE — Telephone Encounter (Signed)
Raphael son states patient needs hospital follow up in 2 weeks. Patient scheduled 02/27/2023 with Dr. Sherene Sires. Needs sooner hospital follow up. Raphael phone number is [phone].

## 2023-01-09 NOTE — Telephone Encounter (Signed)
Spoke with patient's son who says his mother is doing much better. Explained pt had appt 11/25 that was no-showed and J1/24/25 is first available. If patient gets worse to carry her to urgent care/ED. He can call back after 02/05/23 to see if there has been any cancellations.

## 2023-01-16 ENCOUNTER — Encounter: Payer: 59 | Admitting: Acute Care

## 2023-01-16 ENCOUNTER — Telehealth: Payer: Self-pay | Admitting: Internal Medicine

## 2023-01-16 MED ORDER — IPRATROPIUM-ALBUTEROL 0.5-2.5 (3) MG/3ML IN SOLN: RESPIRATORY_TRACT | 0 refills | Status: DC

## 2023-01-16 NOTE — Telephone Encounter (Signed)
Needs a refill of neb solution: Albuterol Sulfate (2 in 1 solution)  She is close to being out.  [facility] in [place].

## 2023-01-16 NOTE — Telephone Encounter (Signed)
One month supply of albuterol has been sent to preferred pharmacy. She no showed todays appt. ATC patient-no answer with no option to leave vm

## 2023-01-16 NOTE — Progress Notes (Signed)
 Attempted to reach pt x3. No answer and not able to leave a message

## 2023-01-20 NOTE — Telephone Encounter (Signed)
Called and spoke with pts son as listed in [place]. He verbalized understanding about albuterol. Nfn

## 2023-01-20 NOTE — Telephone Encounter (Signed)
I tried to call her as well and got VM. NFN

## 2023-02-17 ENCOUNTER — Telehealth: Payer: Self-pay | Admitting: Internal Medicine

## 2023-02-17 NOTE — Telephone Encounter (Signed)
 Patient is out of albuterol nebulizer solution   Pharmacy: [facility] in [place]

## 2023-02-19 NOTE — Telephone Encounter (Signed)
Called [facility] as patient has 12 refills on her Albuterol solution. Asked it they could get ready for patient p/u.Called and spoke with patient's son [name] [name] and notified of this. NFN. No refills from our office needed until 11/25. They need to contact pharmacy for refill.

## 2023-02-23 ENCOUNTER — Telehealth: Payer: Self-pay | Admitting: *Deleted

## 2023-02-23 NOTE — Telephone Encounter (Signed)
[facility] in [place]  Needs neb solution. This is the only one that works. RX # is [phone]  Albuterol 60 count made by [employer]

## 2023-02-25 NOTE — Progress Notes (Signed)
Subjective:   Patient ID: [name] [name], female    DOB: 1950/07/17,     MRN: [id number]    Brief patient profile:  73 yo latina MM/quit smoking 02/2021   [employer] moved  from [place] in  1997  With prev dx of copd around 2010 with doe on qid symbicort x 2013 but gradually worse then admit to [facility] with PTX.     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 Pt states that she has increased dyspnea with exertion. Pt c/o of occasional wet cough with yellow to clear mucus and chest pain with breathing. Pt denies any wheeze.   still way overusing saba at baseline mostly in neb form with doe x across the room and not using symbicort or pain meds correctly (see a/p) rec Plan A = Automatic = Symbiocrt 160 one twice daily and Incruse one click each am  Plan B = Backup - Only use your albuterol (Yellow/proventil) as a rescue medication Plan C = Crisis/ contingency - nebulizer, ok use with albuterol (or combination until you use it up)  every 4 hours if plan B is not working  For pain > tramadol 50 mg up to 2 every 4 hours  For nerves> alprazolam 0.5 mg up to one every 4 hours   05/09/21 np rcs Plan A : Continue Breztri 2 puffs in the morning and at bedtime Plan B: Use albuterol rescue inhaler 2 puffs every 4 hours as needed for breakthrough shortness of breath only Plan C: Use ipratropium albuterol nebulizer every 6 hours as needed for for shortness of breath or wheezing if rescue inhaler ineffective    09/17/2022  f/u ov/[name] [name] re: GOLD 3   maint on breztri  plus maint neb/ off cigs x 3 m  Chief Complaint  Patient presents with   Follow-up    Patient wants to know about Cancer  Screening. Pt having SOB more often with exertion. Pt states neb meds not working anymore.  Dyspnea:  treadmill x 30 min helps her chest tight sensation which tends to be more at rest   Cough: none  Sleeping: flat bed one pillow  SABA use: using neb not hfa  02: none  Rec Plan A = Automatic = Always=    Breztri Take 2 puffs first thing in am and then another 2 puffs about 12 hours later.  Work on inhaler technique:   Plan B = Backup (to supplement plan A, not to replace it) Only use your albuterol inhaler as a rescue medication Plan C = Crisis (instead of Plan B but only if Plan B stops working) - only use your albuterol nebulizer if you first try Plan B   Also  Ok to try albuterol 15 min before an activity (on alternating days)  that you know would usually make you short of breath  Pantoprazole (protonix) 40 mg   Take  30-60 min before first meal of the day and Pepcid (famotidine)  20 mg after supper until return to office  GERD diet reviewed, bed blocks rec   Please remember to go to the lab department   for your tests - we will call you with the results when they are available. Please schedule a follow  up visit in 3 months but call sooner if needed - bring all you medications/inhalers/solutions with you   Admit date: 12/31/2022  Discharge date: 01/06/2023    Brief history of present illness from the day of admission and additional interim summary     73 y.o. female past medical history of COPD and upper respiratory tract infection about 2 weeks ago, smoker who quit earlier this year in 2024, Comes in with worsening shortness of breath                                                                   Hospital Course    Acute respiratory failure with hypoxemia secondary to COPD with acute exacerbation (HCC)/metapneumovirus    Admitted under PCCM and started on IV steroids inhalers, initially required BiPAP currently on nasal cannula oxygen, echo noted with grade 1 diastolic  dysfunction, preserved EF of 65%.  Also on empiric IV antibiotics as she had leukocytosis and there was suspicion of bacterial community-acquired pneumonia, finished antibiotic treatment.  She was encouraged to sit in chair use I-S and flutter valve for pulmonary toiletry.   With treatment with steroids, antibiotic course, nebulizer treatments she is much better, she does qualify for home oxygen which she will get, she will be discharged on a steroid taper along with as needed Lasix for weight above 2.5 pounds from her baseline.  Will follow with PCP and pulmonologist on a close basis.  Currently symptom-free on 2 L nasal cannula oxygen and feeling a whole lot better, eager to go home.   SpO2: 96 % O2 Flow Rate (L/min): 2 L/min FiO2 (%): 30 %   Pure hypercholesterolemia  Continue statins.   Malnutrition of moderate degree - Noted.  Placed on protein supplementation here.     Discharge diagnosis     Acute respiratory failure with hypoxemia (HCC)   OPD with acute exacerbation (HCC)   Pure hypercholesterolemia   Acute exacerbation of chronic obstructive pulmonary disease (COPD) (HCC)   Malnutrition of moderate degree     02/27/2023  f/u ov/[name] [name] re: GOLD 3 copd  maint on Breztri  did not  bring all meds  Chief Complaint  Patient presents with   Follow-up    Cough with green mucus in throat.  Dyspnea:  improved since d/c/ not using 02 anymore/but not checking sats with ex as rec  Cough: variably greenish in am, no using mucinex or fluter as instructed  Sleeping: flat bed/ one pillow s  resp cc  SABA use: very little  02: uses prn     No obvious day to day or daytime variability or assoc  mucus plugs or hemoptysis or cp or chest tightness, subjective wheeze or overt sinus or hb symptoms.    Also denies any obvious fluctuation of symptoms with weather or environmental changes or other aggravating or alleviating factors except as outlined above   No unusual exposure hx or h/o childhood  pna/ asthma or knowledge of premature birth.  Current Allergies, Complete Past Medical History, Past Surgical History, Family History, and Social History were reviewed in [employer] record.  ROS  The following are not active complaints unless bolded Hoarseness, sore throat, dysphagia, dental problems, itching, sneezing,  nasal congestion or discharge of excess  mucus or purulent secretions, ear ache,   fever, chills, sweats, unintended wt loss or wt gain, classically pleuritic or exertional cp,  orthopnea pnd or arm/hand swelling  or leg swelling, presyncope, palpitations, abdominal pain, anorexia, nausea, vomiting, diarrhea  or change in bowel habits or change in bladder habits, change in stools or change in urine, dysuria, hematuria,  rash, arthralgias, visual complaints, headache, numbness, weakness or ataxia or problems with walking or coordination,  change in mood or  memory.        Current Meds  Medication Sig   albuterol (PROVENTIL) (2.5 MG/3ML) 0.083% nebulizer solution Take 3 mLs (2.5 mg total) by nebulization every 4 (four) hours as needed for wheezing or shortness of breath.   albuterol (VENTOLIN HFA) 108 (90 Base) MCG/ACT inhaler INHALE 2 PUFFS BY MOUTH EVERY 4 HOURS AS NEEDED ONLY IF YOU CANT CANNOT CATCH YOUR BREATH   Budeson-Glycopyrrol-Formoterol (BREZTRI AEROSPHERE) 160-9-4.8 MCG/ACT AERO Inhale 2 puffs into the lungs in the morning and at bedtime.   Calcium Carbonate (CALCIUM 600 PO) Take 1 tablet by mouth daily.   carboxymethylcellulose (REFRESH PLUS) 0.5 % SOLN 1 drop 3 (three) times daily as needed.   cetirizine (ZYRTEC) 10 MG tablet Take 1 tablet (10 mg total) by mouth daily. (Patient taking differently: Take 10 mg by mouth daily as needed for allergies.)   CYANOCOBALAMIN PO Take 1 tablet by mouth daily.   Ergocalciferol 50 MCG (2000 UT) CAPS Take by mouth.   ipratropium-albuterol (DUONEB) 0.5-2.5 (3) MG/3ML SOLN INHALE 1 NEBULE VIA NEBULIZER EVERY 6  HOURS AS NEEDED            Objective:   Physical Exam  Wts  02/27/2023        156  09/17/2022        158  09/09/2021          166  02/21/2021        168   06/12/2020       179 03/22/2020        176 06/27/2019        184   12/24/2018    180  11/24/2014      177 > 01/31/2015 180 >  08/29/2016    190     11/10/14 177 lb 3.2 oz (80.377 kg)  11/08/14 178 lb 5 oz (80.882 kg)  10/26/14 177 lb 1.6 oz (80.332 kg)    Vital signs reviewed  02/27/2023  - Note at rest 02 sats  96% on RA   General appearance:    chornically ill amb latina with  min congested cough    HEENT : Oropharynx  clear   Nasal turbinates mild edema s polyps    NECK :  without  apparent JVD/ palpable Nodes/TM    LUNGS: no acc muscle use,  Mild barrel  contour chest wall with bilateral  Distant bs s audible wheeze and  without cough on insp or exp maneuvers  and mild  Hyperresonant  to  percussion bilaterally     CV:  RRR  no s3 or murmur or increase in P2, and no edema   ABD:  soft and nontender with pos end  insp Hoover's  in the supine position.  No bruits or organomegaly appreciated   MS:  Nl gait/ ext warm without deformities Or obvious joint restrictions  calf tenderness, cyanosis or clubbing     SKIN: warm and dry without lesions    NEURO:  alert, approp, nl sensorium with  no  motor or cerebellar deficits apparent.      I personally reviewed images and agree with radiology impression as follows:  CXR:   portable 01/01/23 1. No acute cardiopulmonary disease. 2. Chronic lung changes consistent with emphysema and chronic interstitial thickening.       Assessment & Plan:

## 2023-02-27 ENCOUNTER — Encounter: Payer: Self-pay | Admitting: Internal Medicine

## 2023-02-27 ENCOUNTER — Ambulatory Visit (INDEPENDENT_AMBULATORY_CARE_PROVIDER_SITE_OTHER): Payer: 59 | Admitting: Internal Medicine

## 2023-02-27 VITALS — BP 138/76 | HR 98 | Temp 98.0°F | Ht 64.0 in | Wt 156.0 lb

## 2023-02-27 DIAGNOSIS — R0609 Other forms of dyspnea: Secondary | ICD-10-CM

## 2023-02-27 DIAGNOSIS — J449 Chronic obstructive pulmonary disease, unspecified: Secondary | ICD-10-CM

## 2023-02-27 MED ORDER — BREZTRI AEROSPHERE 160-9-4.8 MCG/ACT IN AERO: 2.0000 | INHALATION_SPRAY | Freq: Two times a day (BID) | RESPIRATORY_TRACT | Status: DC

## 2023-02-27 NOTE — Patient Instructions (Addendum)
Make sure you check your oxygen saturation  AT  your highest level of activity (not after you stop)   to be sure it stays over 90% and adjust  02 flow upward to maintain this level if needed but remember to turn it back to previous settings when you stop (to conserve your supply).   Plan A = Automatic = Always=    Breztri Take 2 puffs first thing in am and then another 2 puffs about 12 hours later.    Work on inhaler technique:  relax and gently blow all the way out then take a nice smooth full deep breath back in, triggeringithe inhaler at same time you start breathing in.  Hold breath in for at least  5 seconds if you can. Blow out breztri thru nose. Rinse and gargle with water when done.  If mouth or throat bother you at all,  try brushing teeth/gums/tongue with arm and hammer toothpaste/ make a slurry and gargle and spit out.   Plan B = Backup (to supplement plan A, not to replace it) Only use your albuterol inhaler as a rescue medication to be used if you can't catch your breath by resting or doing a relaxed purse lip breathing pattern.  - The less you use it, the better it will work when you need it. - Ok to use the inhaler up to 2 puffs  every 4 hours if you must but call for appointment if use goes up over your usual need - Don't leave home without it !!  (think of it like the spare tire for your car)   Plan C = Crisis (instead of Plan B but only if Plan B stops working) - only use your albuterol nebulizer if you first try Plan B and it fails to help > ok to use the nebulizer up to every 4 hours but if start needing it regularly call for immediate appointment  For cough/ congestion > mucinex  or mucinex dm up to maximum of 1200 mg every 12 hours and use the flutter valve as much as you can.  Please schedule a follow up visit in 3 months but call sooner if needed - bring all your medications/inhalers/solutions with you.

## 2023-02-27 NOTE — Assessment & Plan Note (Signed)
Quit smoking 06/2022 / MM  - 11/10/2014  new maint rx = symb 160/incruse sample - [facility] AT 11/24/2014 >  MM/ nl levels - 01/31/2015  extensive coaching HFA effectiveness =    90%  - spirometry 01/31/2015  FEV1  0.85 (37%) ratio 34  - Spirometry 08/29/2016  FEV1 0.71 (30%)  Ratio 36  > added back LAMA = incruse samples  - 06/12/2020    continue breztri  - 02/21/2021  After extensive coaching inhaler device,  effectiveness =    90%  - 02/21/2021 added pred x 6 days as Plan C on action plan> not using as of 09/09/2021  - 09/17/2022  After extensive coaching inhaler device,  effectiveness =    80% > continue breztri with more approp saba:    Group D (now reclassified as E) in terms of symptom/risk and laba/lama/ICS  therefore appropriate rx at this point >>>  breztri plus approp saba:  Re SABA :  I spent extra time with pt today reviewing appropriate use of albuterol for prn use on exertion with the following points: 1) saba is for relief of sob that does not improve by walking a slower pace or resting but rather if the pt does not improve after trying this first. 2) If the pt is convinced, as many are, that saba helps recover from activity faster then it's easy to tell if this is the case by re-challenging : ie stop, take the inhaler, then p 5 minutes try the exact same activity (intensity of workload) that just caused the symptoms and see if they are substantially diminished or not after saba 3) if there is an activity that reproducibly causes the symptoms, try the saba 15 min before the activity on alternate days   If in fact the saba really does help, then fine to continue to use it prn but advised may need to look closer at the maintenance regimen being used to achieve better control of airways disease with exertion.

## 2023-02-27 NOTE — Assessment & Plan Note (Signed)
Onset since stopped smoking 06/2022  - 09/17/2022   Walked on RA  x  3  lap(s) =  approx 750  ft  @ moderate  pace, stopped due to end of study with lowest 02 sats 91% s sob or cp  Advised: Make sure you check your oxygen saturation  AT  your highest level of activity (not after you stop)   to be sure it stays over 90% and adjust  02 flow upward to maintain this level if needed but remember to turn it back to previous settings when you stop (to conserve your supply)         Each maintenance medication was reviewed in detail including emphasizing most importantly the difference between maintenance and prns and under what circumstances the prns are to be triggered using an action plan format where appropriate.  Total time for H and P, chart review, counseling, reviewing hfa/ neb/ 02 puse ox/ flutter valve  device(s) and generating customized AVS unique to this office visit / same day charting = 30 min post hops f/u

## 2023-03-02 ENCOUNTER — Encounter: Payer: Self-pay | Admitting: *Deleted

## 2023-03-02 NOTE — Telephone Encounter (Signed)
[facility]C [facility], the phone was picked up twice and put back on hold.  Albuterol was sent in on 12/30/22, 75 ml with 12 refills.  I ATC the patient, no answer.  Left detailed message per DPR.  Also sent message via mychart.

## 2023-05-26 ENCOUNTER — Other Ambulatory Visit: Payer: Self-pay | Admitting: Internal Medicine

## 2023-05-26 DIAGNOSIS — J449 Chronic obstructive pulmonary disease, unspecified: Secondary | ICD-10-CM

## 2023-05-26 MED ORDER — ALBUTEROL SULFATE HFA 108 (90 BASE) MCG/ACT IN AERS: INHALATION_SPRAY | RESPIRATORY_TRACT | 3 refills | Status: DC

## 2023-05-26 MED ORDER — ALBUTEROL SULFATE (2.5 MG/3ML) 0.083% IN NEBU: 2.5000 mg | INHALATION_SOLUTION | RESPIRATORY_TRACT | 12 refills | Status: AC | PRN

## 2023-05-26 NOTE — Telephone Encounter (Signed)
 Copied from CRM [id number]. Topic: Clinical - Medication Refill >> May 26, 2023  3:56 PM Juliaette Ober wrote: Most Recent Primary Care Visit:  Provider: Devonna Foley E  Department: [facility]  Visit Type: PHYSICAL  Date: 10/15/2021  Medication: albuterol  (VENTOLIN  HFA) 108 (90 Base) MCG/ACT inhaler   Medication : albuterol  (PROVENTIL ) (2.5 MG/3ML) 0.083% nebulizer solution   Has the patient contacted their pharmacy? Yes (Agent: If no, request that the patient contact the pharmacy for the refill. If patient does not wish to contact the pharmacy document the reason why and proceed with request.) (Agent: If yes, when and what did the pharmacy advise?)  Is this the correct pharmacy for this prescription? Yes If no, delete pharmacy and type the correct one.  This is the patient's preferred pharmacy:  [id number] - [place], [place] - [address] [place] [address] Phone: [phone] Fax: [phone]  [facility] [address] [place] [address] Phone: [phone] Fax: [phone]   Has the prescription been filled recently? Yes  Is the patient out of the medication? No  Has the patient been seen for an appointment in the last year OR does the patient have an upcoming appointment? Yes  Can we respond through MyChart? No [phone]  Agent: Please be advised that Rx refills may take up to 3 business days. We ask that you follow-up with your pharmacy.

## 2023-05-29 ENCOUNTER — Other Ambulatory Visit: Payer: Self-pay | Admitting: Internal Medicine

## 2023-06-01 ENCOUNTER — Telehealth: Payer: Self-pay

## 2023-06-01 NOTE — Telephone Encounter (Signed)
 Copied from CRM [id number]. Topic: Clinical - Prescription Issue >> May 29, 2023 12:06 PM Evie Hoff wrote: Reason for CRM: patient son is calling cause they only gave her the inhaler at the pharmacy and not the albuterol  (PROVENTIL ) (2.5 MG/3ML) 0.083% nebulizer solution Did tell the son that both of them was confirmed by the pharmacy on April 22nd . He says they only had the inhaler . Please reach out to patient or pharmacy to see what went wrong . The pharmacy said they are waiting for the doctors request for the nebulizer    Spoke w/ [facility]  they states they filled both neb solution and both were picked up by patient same day as call.

## 2023-06-14 NOTE — Progress Notes (Unsigned)
 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 Pt states that she has increased dyspnea with exertion. Pt c/o of occasional wet cough with yellow to clear mucus and chest pain with breathing. Pt denies any wheeze.   still way overusing saba at baseline mostly in neb form with doe x across the room and not using symbicort  or pain meds correctly (see a/p) rec Plan A = Automatic = Symbiocrt 160 one twice daily and Incruse one click each am  Plan B = Backup - Only use your albuterol  (Yellow/proventil ) as a rescue medication Plan C = Crisis/ contingency - nebulizer, ok use with albuterol  (or combination until you use it up)  every 4 hours if plan B is not working  For pain > tramadol  50 mg up to 2 every 4 hours  For nerves> alprazolam  0.5 mg up to one every 4 hours   05/09/21 np rcs Plan A : Continue Breztri  2 puffs in the morning and at bedtime Plan B: Use albuterol  rescue inhaler 2 puffs every 4 hours as needed for breakthrough shortness of breath only Plan C: Use ipratropium albuterol  nebulizer every 6 hours as needed for for shortness of breath or wheezing if rescue inhaler ineffective    09/17/2022  f/u ov/Tomesha Sargent re: GOLD 3   maint on breztri   plus maint neb/ off cigs x 3 m  Chief Complaint  Patient presents with   Follow-up    Patient wants to know about Cancer  Screening. Pt having SOB more often with exertion. Pt states neb meds not working anymore.  Dyspnea:  treadmill x 30 min helps her chest tight sensation which tends to be more at rest   Cough: none  Sleeping: flat bed one pillow  SABA use: using neb not hfa  02: none  Rec Plan A = Automatic = Always=    Breztri  Take 2 puffs first thing in am and then another 2 puffs about 12 hours later.  Work on inhaler technique:   Plan B = Backup (to supplement plan A, not to replace it) Only use your albuterol  inhaler as a rescue medication Plan C = Crisis (instead of Plan B but only if Plan B stops working) - only use your albuterol  nebulizer if you first try Plan B   Also  Ok to try albuterol  15 min before an activity (on alternating days)  that you know would usually make you short of breath  Pantoprazole  (protonix ) 40 mg   Take  30-60 min before first meal of the day and Pepcid  (famotidine )  20 mg after supper until return to office  GERD diet reviewed, bed blocks rec   Please remember to go to the lab department   for your tests - we will call you with the results when they are available. Please schedule a follow up  visit in 3 months but call sooner if needed - bring all you medications/inhalers/solutions with you   Admit date: 12/31/2022  Discharge date: 01/06/2023    Brief history of present illness from the day of admission and additional interim summary     73 y.o. female past medical history of COPD and upper respiratory tract infection about 2 weeks ago, smoker who quit earlier this year in 2024, Comes in with worsening shortness of breath                                                                   Hospital Course    Acute respiratory failure with hypoxemia secondary to COPD with acute exacerbation (HCC)/metapneumovirus    Admitted under PCCM and started on IV steroids inhalers, initially required BiPAP currently on nasal cannula oxygen , echo noted with grade 1 diastolic  dysfunction, preserved EF of 65%.  Also on empiric IV antibiotics as she had leukocytosis and there was suspicion of bacterial community-acquired pneumonia, finished antibiotic treatment.  She was encouraged to sit in chair use I-S and flutter valve for pulmonary toiletry.   With treatment with steroids, antibiotic course, nebulizer treatments she is much better, she does qualify for home oxygen  which she will get, she will be discharged on a steroid taper along with as needed Lasix  for weight above 2.5 pounds from her baseline.  Will follow with PCP and pulmonologist on a close basis.  Currently symptom-free on 2 L nasal cannula oxygen  and feeling a whole lot better, eager to go home.   SpO2: 96 % O2 Flow Rate (L/min): 2 L/min FiO2 (%): 30 %   Pure hypercholesterolemia  Continue statins.   Malnutrition of moderate degree - Noted.  Placed on protein supplementation here.     Discharge diagnosis     Acute respiratory failure with hypoxemia (HCC)   OPD with acute exacerbation (HCC)   Pure hypercholesterolemia   Acute exacerbation of chronic obstructive pulmonary disease (COPD) (HCC)   Malnutrition of moderate degree     02/27/2023  f/u ov/Quintana Canelo re: GOLD 3 copd  maint on Breztri   did not  bring all meds  Chief Complaint  Patient presents with   Follow-up    Cough with green mucus in throat.  Dyspnea:  improved since d/c/ not using 02 anymore/but not checking sats with ex as rec  Cough: variably greenish in am, no using mucinex  or fluter as instructed  Sleeping: flat bed/ one pillow s  resp cc  SABA use: very little  02: uses prn  Rec Make sure you check your oxygen  saturation  AT  your highest level of activity (not after you stop)   to be sure it stays over 90%   Plan A = Automatic = Always=    Breztri  Take 2 puffs first thing in am and then another 2 puffs about 12 hours later.  Work on inhaler technique:  Plan B = Backup (to supplement plan A, not to replace it) Only use your  albuterol  inhaler as a rescue medication Plan C = Crisis (instead of Plan B but only if Plan B stops working) - only use your albuterol  nebulizer if you first try Plan B  For cough/ congestion > mucinex   or mucinex  dm up  to maximum of 1200 mg every 12 hours and use the flutter valve as much as you can.  Please schedule a follow up visit in 3 months but call sooner if needed - bring all your medications/inhalers/solutions with you.     06/15/2023  f/u ov/Everest Brod re: GOLD 3 COPD    maint on breztri  did not  bring all  meds  Chief Complaint  Patient presents with   Follow-up    More sob since hospitalization in November.  She is unable to walk like he could before.   Dyspnea:  treadmill x 30 min at 3 degrees not checking sats but also not really all that sob  either. Cough: in am  variable mucus production  Sleeping: flat bed one pillow s resp cc  SABA use: hfa   02: has concentrator not using  not using   No obvious day to day or daytime variability or assoc excess/ purulent sputum or mucus plugs or hemoptysis or cp or chest tightness, subjective wheeze or overt sinus or hb symptoms.    Also denies any obvious fluctuation of symptoms with weather or environmental changes or other aggravating or alleviating factors except as outlined above   No unusual exposure hx or h/o childhood pna/ asthma or knowledge of premature birth.  Current Allergies, Complete Past Medical History, Past Surgical History, Family History, and Social History were reviewed in [employer] record.  ROS  The following are not active complaints unless bolded Hoarseness, sore throat, dysphagia, dental problems, itching, sneezing,  nasal congestion or discharge of excess mucus or purulent secretions, ear ache,   fever, chills, sweats, unintended wt loss or wt gain, classically pleuritic or exertional cp,  orthopnea pnd or arm/hand swelling  or leg swelling, presyncope, palpitations, abdominal pain,  anorexia, nausea, vomiting, diarrhea  or change in bowel habits or change in bladder habits, change in stools or change in urine, dysuria, hematuria,  rash, arthralgias, visual complaints, headache, numbness, weakness or ataxia or problems with walking or coordination,  change in mood or  memory.        Current Meds  Medication Sig   albuterol  (PROVENTIL ) (2.5 MG/3ML) 0.083% nebulizer solution Take 3 mLs (2.5 mg total) by nebulization every 4 (four) hours as needed for wheezing or shortness of breath.   albuterol  (VENTOLIN  HFA) 108 (90 Base) MCG/ACT inhaler INHALE 2 PUFFS BY MOUTH EVERY 4 HOURS AS NEEDED ONLY IF YOU CANT CANNOT CATCH YOUR BREATH   Budeson-Glycopyrrol-Formoterol  (BREZTRI  AEROSPHERE) 160-9-4.8 MCG/ACT AERO Inhale 2 puffs into the lungs in the morning and at bedtime.   Budeson-Glycopyrrol-Formoterol  (BREZTRI  AEROSPHERE) 160-9-4.8 MCG/ACT AERO Inhale 2 puffs into the lungs in the morning and at bedtime.   Calcium  Carbonate (CALCIUM  600 PO) Take 1 tablet by mouth daily.   carboxymethylcellulose (REFRESH PLUS) 0.5 % SOLN 1 drop 3 (three) times daily as needed.   CYANOCOBALAMIN  PO Take 1 tablet by mouth daily.   Ergocalciferol  50 MCG (2000 UT) CAPS Take by mouth.   ipratropium-albuterol  (DUONEB) 0.5-2.5 (3) MG/3ML SOLN INHALE 1 NEBULE VIA NEBULIZER EVERY 6 HOURS AS NEEDED           Objective:   Physical Exam  Wts  06/15/2023        165  02/27/2023        156  09/17/2022        158  09/09/2021          166  02/21/2021        168  06/12/2020       179 03/22/2020        176 06/27/2019        184   12/24/2018    180  11/24/2014      177 > 01/31/2015 180 >  08/29/2016    190     11/10/14 177 lb 3.2 oz (80.377 kg)  11/08/14 178 lb 5 oz (80.882 kg)  10/26/14 177 lb 1.6 oz (80.332 kg)    Vital signs reviewed  06/15/2023  - Note at rest 02 sats  90% on RA   General appearance:    amb pleasant latina nad   HEENT : Oropharynx  clear   Nasal turbinates nl    NECK :  without  apparent  JVD/ palpable Nodes/TM    LUNGS: no acc muscle use,  Mild barrel  contour chest wall with bilateral  Distant bs s audible wheeze and  without cough on insp or exp maneuvers  and mild  Hyperresonant  to  percussion bilaterally     CV:  RRR  no s3 or murmur or increase in P2, and no edema   ABD:  soft and nontender with pos end  insp Hoover's  in the supine position.  No bruits or organomegaly appreciated   MS:  Nl gait/ ext warm without deformities Or obvious joint restrictions  calf tenderness, cyanosis or clubbing     SKIN: warm and dry without lesions    NEURO:  alert, approp, nl sensorium with  no motor or cerebellar deficits apparent.         Assessment & Plan:

## 2023-06-15 ENCOUNTER — Ambulatory Visit: Payer: 59 | Admitting: Internal Medicine

## 2023-06-15 ENCOUNTER — Telehealth: Payer: Self-pay | Admitting: Internal Medicine

## 2023-06-15 ENCOUNTER — Encounter: Payer: Self-pay | Admitting: Internal Medicine

## 2023-06-15 VITALS — BP 124/80 | HR 103 | Temp 98.3°F | Ht 64.57 in | Wt 165.6 lb

## 2023-06-15 DIAGNOSIS — R0609 Other forms of dyspnea: Secondary | ICD-10-CM

## 2023-06-15 DIAGNOSIS — R0602 Shortness of breath: Secondary | ICD-10-CM

## 2023-06-15 DIAGNOSIS — Z87891 Personal history of nicotine dependence: Secondary | ICD-10-CM | POA: Diagnosis not present

## 2023-06-15 DIAGNOSIS — J449 Chronic obstructive pulmonary disease, unspecified: Secondary | ICD-10-CM

## 2023-06-15 MED ORDER — FAMOTIDINE 20 MG PO TABS: ORAL_TABLET | ORAL | 11 refills | Status: AC

## 2023-06-15 MED ORDER — PANTOPRAZOLE SODIUM 40 MG PO TBEC: 40.0000 mg | DELAYED_RELEASE_TABLET | Freq: Every day | ORAL | 2 refills | Status: AC

## 2023-06-15 NOTE — Patient Instructions (Addendum)
 Plan A = Automatic = Always=    Breztri  Take 2 puffs first thing in am and then another 2 puffs about 12 hours later.    Work on inhaler technique:  relax and gently blow all the way out then take a nice smooth full deep breath back in, triggering the inhaler at same time you start breathing in.  Hold breath in for at least  5 seconds if you can. Blow out breztri   thru nose. Rinse and gargle with water when done.  If mouth or throat bother you at all,  try brushing teeth/gums/tongue with arm and hammer toothpaste/ make a slurry and gargle and spit out.      Plan B = Backup (to supplement plan A, not to replace it) Only use your albuterol  inhaler as a rescue medication to be used if you can't catch your breath by resting or doing a relaxed purse lip breathing pattern.  - The less you use it, the better it will work when you need it. - Ok to use the inhaler up to 2 puffs  every 4 hours if you must but call for appointment if use goes up over your usual need - Don't leave home without it !!  (think of it like the spare tire for your car)   Plan C = Crisis (instead of Plan B but only if Plan B stops working) - only use your albuterol  nebulizer if you first try Plan B and it fails to help > ok to use the nebulizer up to every 4 hours but if start needing it regularly call for immediate appointment    Also  Ok to try albuterol  15 min before an activity (on alternating days once with the inhaler, once with the nebulizer,and once with nothing )  that you know would usually make you short of breath and see if it makes any difference and if makes none then don't take albuterol  after activity unless you can't catch your breath as this means it's the resting that helps, not the albuterol .      Make sure you check your oxygen  saturation  AT  your highest level of activity (not after you stop)   to be sure it stays over 90% and adjust  02 flow upward to maintain this level if needed but remember to turn it back  to previous settings when you stop (to conserve your supply).   Pantoprazole  (protonix ) 40 mg   Take  30-60 min before first meal of the day and Pepcid  (famotidine )  20 mg before bed until return to office - this is the best way to tell whether stomach acid is contributing to your problem.    Please schedule a follow up visit in 3 months but call sooner if needed   Add: My office will be contacting you by phone for referral to lung cancer screening   [phone]) - if you don't hear back from my office within one week,  please call us  back or notify us  thru MyChart and we'll address it right away.      Plan A = Automtico = Siempre = Breztri . Tome 2 inhalaciones a primera hora de la maana y otras 2 inhalaciones unas 12 horas despus.  Practica la tcnica del inhalador: reljate y exhala suavemente hasta el fondo, luego inhala de nuevo profunda y Hamer, activando el inhalador al mismo tiempo que empiezas a Visual merchandiser. Aguanta la respiracin durante al menos 5 segundos si puedes. Exhala el Breztri  por la  [place] y haz grgaras con agua al terminar. Si te molesta la boca o la garganta, intenta [employer], las encas o la lengua con pasta dental Arm & Hammer; haz una pasta, haz grgaras y escpela.  Plan B = Respaldo (para complementar el plan A, no para reemplazarlo). Usa  tu inhalador de albuterol  solo como medicamento de rescate si no puedes recuperar el aliento descansando o respirando relajadamente con los labios fruncidos. - Cuanto menos lo uses, mejor funcionar cuando lo necesites. - Puede usar Engineer, building services 2 inhalaciones cada 4 horas si es necesario, pero llame para pedir cita si el uso supera su necesidad habitual. - No salga de casa sin l! ([place] como la rueda de repuesto de su coche).  Plan C = Crisis (en lugar del Plan B, pero solo si el Plan B deja de funcionar). - Use su nebulizador de albuterol  solo si prueba el Plan B primero y no le ayuda. > Puede  usar el nebulizador hasta cada 4 horas, pero si empieza a necesitarlo regularmente, llame para pedir cita de inmediato.  Tambin puede probar el albuterol  15 minutos antes de una actividad (en [place], una vez con el [place], otra con el nebulizador y otra sin nada) que sepa que normalmente le causa dificultad para respirar y Patent attorney si mejora. Si no mejora, no tome albuterol  despus de la actividad a menos que no pueda recuperar el [place], ya que esto significa que es el descanso lo que ayuda, no el albuterol .  Asegrese de revisar su saturacin de oxgeno en su nivel mximo de actividad (no despus de parar) para asegurarse de que se mantenga por encima del 90 %. Ajuste el flujo de O2 hacia arriba para mantener este nivel si es necesario, pero recuerde volver a la configuracin anterior al parar (para conservar el suministro).  Pantoprazol (Protonix ) 40 mg: Tmelo 30-60 minutos antes de la primera comida del da y Pepcid  (famotidina) 20 mg antes de [employer] regrese a Youth worker. Esta es la mejor manera de saber si el cido estomacal est contribuyendo a su problema.  Programe una visita de seguimiento en 3 meses, pero llame antes si es necesario.

## 2023-06-15 NOTE — Assessment & Plan Note (Signed)
 Quit smoking 06/2022 / MM  - 11/10/2014  new maint rx = symb 160/incruse sample - Alpha one AT 11/24/2014 >  MM/ nl levels - 01/31/2015  extensive coaching HFA effectiveness =    90%  - spirometry 01/31/2015  FEV1  0.85 (37%) ratio 34  - Spirometry 08/29/2016  FEV1 0.71 (30%)  Ratio 36  > added back LAMA = incruse samples  - 06/12/2020    continue breztri   - 02/21/2021  After extensive coaching inhaler device,  effectiveness =    90%  - 02/21/2021 added pred x 6 days as Plan C on action plan> not using as of 09/09/2021  - 09/17/2022    continue breztri  with more approp saba: - 06/15/2023  After extensive coaching inhaler device,  effectiveness =    75% (short Ti)    Group D (now reclassified as E) in terms of symptom/risk and laba/lama/ICS  therefore appropriate rx at this point >>>  breztri  plus more approp saba:  Re SABA :  I spent extra time with pt today reviewing appropriate use of albuterol  for prn use on exertion with the following points: 1) saba is for relief of sob that does not improve by walking a slower pace or resting but rather if the pt does not improve after trying this first. 2) If the pt is convinced, as many are, that saba helps recover from activity faster then it's easy to tell if this is the case by re-challenging : ie stop, take the inhaler, then p 5 minutes try the exact same activity (intensity of workload) that just caused the symptoms and see if they are substantially diminished or not after saba 3) if there is an activity that reproducibly causes the symptoms, try the saba 15 min before the activity on alternate days   If in fact the saba really does help, then fine to continue to use it prn but advised may need to look closer at the maintenance regimen being used to achieve better control of airways disease with exertion.

## 2023-06-15 NOTE — Assessment & Plan Note (Signed)
Low-dose CT lung cancer screening is recommended for patients who are 65-72 years of age with a 20+ pack-year history of smoking and who are currently smoking or quit <=15 years ago. No coughing up blood  No unintentional weight loss of > 15 pounds in the last 6 months - pt is eligible for scanning yearly until age 84 > referred

## 2023-06-15 NOTE — Assessment & Plan Note (Signed)
 Onset since stopped smoking 06/2022  - 09/17/2022   Walked on RA  x  3  lap(s) =  approx 750  ft  @ moderate  pace, stopped due to end of study with lowest 02 sats 91% s sob or cp  Nothing by hx (treadmill does fine) or exam to indicate any additional concerns other than wt gain/ deconditioning   Rec  Check sats at peak ex to be sure not hypoxemic   ? Acid (or non-acid) GERD > always difficult to exclude as up to 75% of pts in some series report no assoc GI/ Heartburn symptoms> rec max (24h)  acid suppression x 3 month trial  and diet restrictions/ reviewed and instructions given in writing.

## 2023-06-15 NOTE — Telephone Encounter (Signed)
 Chart review indicated she hasn't had screening ct and I didn't get to tell her but ordered it p office.

## 2023-06-17 NOTE — Telephone Encounter (Signed)
 I called the pt and there was no answer- LMTCB. ?

## 2023-07-01 ENCOUNTER — Other Ambulatory Visit: Payer: Self-pay | Admitting: Internal Medicine

## 2023-07-01 ENCOUNTER — Telehealth: Payer: Self-pay | Admitting: *Deleted

## 2023-07-01 NOTE — Telephone Encounter (Signed)
 Copied from CRM [id number]. Topic: Clinical - Prescription Issue >> Jun 30, 2023  3:49 PM Margarette Shawl wrote: Reason for CRM:   Pt's son, Blenda Burdock, is contacting clinic to refill albuterol  (PROVENTIL ) (2.5 MG/3ML) 0.083% nebulizer solution. Advised according to system, she has 12 remaining refills. He reports that last time, when she should have had refills, they were advised to contact clinic by pharmacy.   Requests call back   # [phone]  I called Rafael and there was no answer- LMTCB  They should contact pharmacy about the refill since we provided 12 rf recently.

## 2023-07-07 NOTE — Telephone Encounter (Signed)
 Spoke with Jacob at [facility] he states pt picked up rx on 05/30/2023 and has 11 refills remaining, I called and left message on VM to notify [facility]

## 2023-07-31 ENCOUNTER — Other Ambulatory Visit: Payer: Self-pay | Admitting: Family Medicine

## 2023-07-31 DIAGNOSIS — J449 Chronic obstructive pulmonary disease, unspecified: Secondary | ICD-10-CM

## 2023-07-31 NOTE — Telephone Encounter (Signed)
 Copied from CRM [id number]. Topic: Clinical - Medication Refill >> Jul 31, 2023  1:14 PM Tanazia G wrote: Medication:  ipratropium-albuterol  (DUONEB) 0.5-2.5 (3) MG/3ML SOLN  albuterol  (PROVENTIL ) (2.5 MG/3ML) 0.083% nebulizer solution  albuterol  (VENTOLIN  HFA) 108 (90 Base) MCG/ACT inhaler   Has the patient contacted their pharmacy? Yes (Agent: If no, request that the patient contact the pharmacy for the refill. If patient does not wish to contact the pharmacy document the reason why and proceed with request.) (Agent: If yes, when and what did the pharmacy advise?)  This is the patient's preferred pharmacy:  [id number] - [place], [place] - [phone] [place] [address] Phone: [phone] Fax: [phone]  [facility] [address] [place] [address] Phone: [phone] Fax: [phone]  Is this the correct pharmacy for this prescription? Yes If no, delete pharmacy and type the correct one.   Has the prescription been filled recently? Yes  Is the patient out of the medication? Yes  Has the patient been seen for an appointment in the last year OR does the patient have an upcoming appointment? Yes  Can we respond through MyChart? Yes  Agent: Please be advised that Rx refills may take up to 3 business days. We ask that you follow-up with your pharmacy.

## 2023-08-24 ENCOUNTER — Telehealth: Payer: Self-pay | Admitting: Internal Medicine

## 2023-08-24 NOTE — Telephone Encounter (Signed)
 Fax received from Dr. Gretta with [facility] perform a dental procudure on patient.  Patient needs surgery clearance. Surgery is pending. Patient was seen on 06/15/23. Office protocol is a risk assessment can be sent to surgeon if patient has been seen in 60 days or less.   Sending to Dr Darlean for risk assessment or recommendations if patient needs to be seen in office prior to surgical procedure.

## 2023-08-24 NOTE — Telephone Encounter (Signed)
 PT dropped off surgical clarence paperwork for Dr.Wert. PT asked to be placed on the NO call list. PT was also given APT reminders of upcomming APT.

## 2023-08-25 NOTE — Telephone Encounter (Signed)
 Called mobile number and there was no answer- left detailed msg that her clearance was faxed.  Called home number and there was no answer- left detailed msg that her clearance was faxed.

## 2023-08-25 NOTE — Telephone Encounter (Signed)
 Copy of this encounter faxed to [facility] at [phone]

## 2023-09-06 NOTE — Progress Notes (Unsigned)
 Subjective:   Patient ID: [name] [name], female    DOB: April 01, 1950,     MRN: [id number]    Brief patient profile:  73 yo latina MM/quit smoking 06/2022  [employer] moved  from [place] in  1997  With prev dx of copd around 2010 with doe on qid symbicort  x 2013 but gradually worse then admit to [facility] with PTX.     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 Pt states that she has increased dyspnea with exertion. Pt c/o of occasional wet cough with yellow to clear mucus and chest pain with breathing. Pt denies any wheeze.   still way overusing saba at baseline mostly in neb form with doe x across the room and not using symbicort  or pain meds correctly (see a/p) rec Plan A = Automatic = Symbiocrt 160 one twice daily and Incruse one click each am  Plan B = Backup - Only use your albuterol  (Yellow/proventil ) as a rescue medication Plan C = Crisis/ contingency - nebulizer, ok use with albuterol  (or combination until you use it up)  every 4 hours if plan B is not working  For pain > tramadol  50 mg up to 2 every 4 hours  For nerves> alprazolam  0.5 mg up to one every 4 hours   05/09/21 np rcs Plan A : Continue Breztri  2 puffs in the morning and at bedtime Plan B: Use albuterol  rescue inhaler 2 puffs every 4 hours as needed for breakthrough shortness of breath only Plan C: Use ipratropium albuterol  nebulizer every 6 hours as needed for for shortness of breath or wheezing if rescue inhaler ineffective    09/17/2022  f/u ov/[name] [name] re: GOLD 3   maint on breztri   plus maint neb/ off cigs x 3 m  Chief Complaint  Patient presents with   Follow-up    Patient wants to know about Cancer  Screening. Pt having SOB more often with exertion. Pt states neb meds not working anymore.  Dyspnea:  treadmill x 30 min helps her chest tight sensation which tends to be more at rest   Cough: none  Sleeping: flat bed one pillow  SABA use: using neb not hfa  02: none  Rec Plan A = Automatic = Always=    Breztri  Take 2 puffs first thing in am and then another 2 puffs about 12 hours later.  Work on inhaler technique:   Plan B = Backup (to supplement plan A, not to replace it) Only use your albuterol  inhaler as a rescue medication Plan C = Crisis (instead of Plan B but only if Plan B stops working) - only use your albuterol  nebulizer if you first try Plan B   Also  Ok to try albuterol  15 min before an activity (on alternating days)  that you know would usually make you short of breath  Pantoprazole  (protonix ) 40 mg   Take  30-60 min before first meal of the day and Pepcid  (famotidine )  20 mg after supper until return to office  GERD diet reviewed, bed blocks rec   Please remember to go to the lab department   for your tests - we will call you with the results when they are available. Please schedule a follow up  visit in 3 months but call sooner if needed - bring all you medications/inhalers/solutions with you   Admit date: 12/31/2022  Discharge date: 01/06/2023    Brief history of present illness from the day of admission and additional interim summary     73 y.o. female past medical history of COPD and upper respiratory tract infection about 2 weeks ago, smoker who quit earlier this year in 2024, Comes in with worsening shortness of breath                                                                   Hospital Course    Acute respiratory failure with hypoxemia secondary to COPD with acute exacerbation (HCC)/metapneumovirus    Admitted under PCCM and started on IV steroids inhalers, initially required BiPAP currently on nasal cannula oxygen , echo noted with grade 1 diastolic  dysfunction, preserved EF of 65%.  Also on empiric IV antibiotics as she had leukocytosis and there was suspicion of bacterial community-acquired pneumonia, finished antibiotic treatment.  She was encouraged to sit in chair use I-S and flutter valve for pulmonary toiletry.   With treatment with steroids, antibiotic course, nebulizer treatments she is much better, she does qualify for home oxygen  which she will get, she will be discharged on a steroid taper along with as needed Lasix  for weight above 2.5 pounds from her baseline.  Will follow with PCP and pulmonologist on a close basis.  Currently symptom-free on 2 L nasal cannula oxygen  and feeling a whole lot better, eager to go home.   SpO2: 96 % O2 Flow Rate (L/min): 2 L/min FiO2 (%): 30 %   Pure hypercholesterolemia  Continue statins.   Malnutrition of moderate degree - Noted.  Placed on protein supplementation here.     Discharge diagnosis     Acute respiratory failure with hypoxemia (HCC)   OPD with acute exacerbation (HCC)   Pure hypercholesterolemia   Acute exacerbation of chronic obstructive pulmonary disease (COPD) (HCC)   Malnutrition of moderate degree     02/27/2023  f/u ov/[name] [name] re: GOLD 3 copd  maint on Breztri   did not  bring all meds  Chief Complaint  Patient presents with   Follow-up    Cough with green mucus in throat.  Dyspnea:  improved since d/c/ not using 02 anymore/but not checking sats with ex as rec  Cough: variably greenish in am, no using mucinex  or fluter as instructed  Sleeping: flat bed/ one pillow s  resp cc  SABA use: very little  02: uses prn  Rec Make sure you check your oxygen  saturation  AT  your highest level of activity (not after you stop)   to be sure it stays over 90%   Plan A = Automatic = Always=    Breztri  Take 2 puffs first thing in am and then another 2 puffs about 12 hours later.  Work on inhaler technique:  Plan B = Backup (to supplement plan A, not to replace it) Only use your  albuterol  inhaler as a rescue medication Plan C = Crisis (instead of Plan B but only if Plan B stops working) - only use your albuterol  nebulizer if you first try Plan B  For cough/ congestion > mucinex   or mucinex  dm up  to maximum of 1200 mg every 12 hours and use the flutter valve as much as you can.  Please schedule a follow up visit in 3 months but call sooner if needed - bring all your medications/inhalers/solutions with you.  06/15/2023  f/u ov/Raymie Trani re: GOLD 3 COPD    maint on breztri  did not  bring all  meds  Chief Complaint  Patient presents with   Follow-up    More sob since hospitalization in November.  She is unable to walk like he could before.   Dyspnea:  treadmill x 30 min at 3 degrees not checking sats but also not really all that sob  either. Cough: in am  variable mucus production  Sleeping: flat bed one pillow s resp cc  SABA use: hfa   02: has concentrator not using  not using Rec Plan A = Automatic = Always=    Breztri  Take 2 puffs first thing in am and then another 2 puffs about 12 hours later.   Work on inhaler technique:     Plan B = Backup (to supplement plan A, not to replace it) Only use your albuterol  inhaler as a rescue medication Plan C = Crisis (instead of Plan B but only if Plan B stops working) - only use your albuterol  nebulizer if you first try Plan B Also  Ok to try albuterol  15 min before an activity (on alternating days once with the inhaler, once with the nebulizer,and once with nothing )  that you know would usually make you short of breath  Make sure you check your oxygen  saturation  AT  your highest level of activity (not after you stop)   to be sure it stays over 90%  Pantoprazole  (protonix ) 40 mg   Take  30-60 min before first meal of the day and Pepcid  (famotidine )  20 mg before bed until return to officePlease  Add: My office will be contacting you by phone for referral to lung cancer screening   [phone])> not done as of 09/10/2023      09/10/2023  f/u ov/Jarel Cuadra re: GOLD 3 copd    maint on Breztri  2 mostly just am dose  needs [facility] (she has number, plans to call)  and confused  re  [facility] plan  Chief Complaint  Patient presents with   Follow-up    COPD Pt is concerned with reaction to an inhaler.    Dyspnea:  doing treadmill again ok  Cough: none  Sleeping: flat bed / one pillow  resp cc  SABA use: likes ventolin  over proair  / convinced duoneb raises bp and note she's not supposed to be using it except as a backup to plan B and instead is freq using q am (plan A)  instead of plan C above  02: not using   Lung cancer screening : scheduled     No obvious day to day or daytime variability or assoc excess/ purulent sputum or mucus plugs or hemoptysis or cp or chest tightness, subjective wheeze or overt sinus or hb symptoms.    Also denies any obvious fluctuation of symptoms with weather or environmental changes or other aggravating or alleviating factors except as outlined above   No unusual exposure hx or h/o childhood pna/ asthma or knowledge of premature birth.  Current Allergies, Complete Past Medical History, Past Surgical History, Family History, and Social History were reviewed in [employer] record.  ROS  The following are not active complaints unless bolded Hoarseness, sore  throat, dysphagia, dental problems, itching, sneezing,  nasal congestion or discharge of excess mucus or purulent secretions, ear ache,   fever, chills, sweats, unintended wt loss or wt gain, classically pleuritic or exertional cp,  orthopnea pnd or arm/hand swelling  or leg swelling, presyncope, palpitations, abdominal pain, anorexia, nausea, vomiting, diarrhea  or change in bowel habits or change in bladder habits, change in stools or change in urine, dysuria, hematuria,  rash, arthralgias, visual complaints, headache, numbness, weakness or ataxia or problems with walking or coordination,  change in mood or  memory.         Current Meds  Medication Sig   albuterol  (PROVENTIL ) (2.5 MG/3ML) 0.083% nebulizer solution Take 3 mLs (2.5 mg total) by nebulization every 4 (four) hours as needed for wheezing or shortness of breath.   Budeson-Glycopyrrol-Formoterol  (BREZTRI  AEROSPHERE) 160-9-4.8 MCG/ACT AERO Inhale 2 puffs into the lungs in the morning and at bedtime.   Budeson-Glycopyrrol-Formoterol  (BREZTRI  AEROSPHERE) 160-9-4.8 MCG/ACT AERO Inhale 2 puffs into the lungs in the morning and at bedtime.   Calcium  Carbonate (CALCIUM  600 PO) Take 1 tablet by mouth daily.   carboxymethylcellulose (REFRESH PLUS) 0.5 % SOLN 1 drop 3 (three) times daily as needed.   CYANOCOBALAMIN  PO Take 1 tablet by mouth daily.   Ergocalciferol  50 MCG (2000 UT) CAPS Take by mouth.   famotidine  (PEPCID ) 20 MG tablet One after supper          Objective:   Physical Exam  Wts  09/10/2023          165  06/15/2023        165  02/27/2023        156  09/17/2022        158  09/09/2021          166  02/21/2021        168   06/12/2020       179 03/22/2020        176 06/27/2019        184   12/24/2018    180  11/24/2014      177 > 01/31/2015 180 >  08/29/2016    190     11/10/14 177 lb 3.2 oz (80.377 kg)  11/08/14 178 lb 5 oz (80.882 kg)  10/26/14 177 lb 1.6 oz (80.332 kg)    Vital signs reviewed  09/10/2023  - Note at rest 02 sats  99% on RA   General appearance:    amb latina nad    HEENT : Oropharynx  clear   Nasal turbinates nl    NECK :  without  apparent JVD/ palpable Nodes/TM    LUNGS: no acc muscle use,  Min barrel  contour chest wall with bilateral  slightly decreased bs s audible wheeze and  without cough on insp or exp maneuvers and min  Hyperresonant  to  percussion bilaterally    CV:  RRR  no s3 or murmur or increase in P2, and no edema   ABD:  soft and nontender    MS:  Nl gait/ ext warm without deformities Or obvious joint restrictions  calf tenderness, cyanosis or clubbing     SKIN: warm and dry without lesions     NEURO:  alert, approp, nl sensorium with  no motor or cerebellar deficits apparent.           .         Assessment & Plan:      Assessment & Plan COPD GOLD  III Quit smoking 06/2022 / MM  - 11/10/2014  new maint rx = symb 160/incruse sample - [facility] AT 11/24/2014 >  MM/ nl levels - 01/31/2015  extensive coaching HFA effectiveness =    90%  - spirometry 01/31/2015  FEV1  0.85 (37%) ratio 34  - Spirometry 08/29/2016  FEV1 0.71 (30%)  Ratio 36  > added back LAMA = incruse samples  - 06/12/2020    continue breztri   - 02/21/2021  After extensive coaching inhaler device,  effectiveness =    90%  - 02/21/2021 added pred x 6 days as Plan C on action plan> not using as of 09/09/2021    - 09/10/2023  After extensive coaching inhaler device,  effectiveness =    75% (short Ti on HFA) >>>   continue breztri  with more approp saba:  Re SABA :  I spent extra time with pt today reviewing appropriate use of albuterol  for prn use on exertion with the following points: 1) saba is for relief of sob that does not improve by walking a slower pace or resting but rather if the pt does not improve after trying this first. 2) If the pt is convinced, as many are, that saba helps recover from activity faster then it's easy to tell if this is the case by re-challenging : ie stop, take the inhaler, then p 5 minutes try the exact same activity (intensity of workload) that just caused the symptoms and see if they are substantially diminished or not after saba 3) if there is an activity that reproducibly causes the symptoms, try the saba 15 min before the activity on alternate days   If in fact the saba really does help, then fine to continue to use it prn but advised may need to look closer at the maintenance regimen being used (breztri  in this case)  to achieve better control of airways disease with exertion.   F/ q 6 m, sooner prn       Each maintenance medication was reviewed in detail including emphasizing most  importantly the difference between maintenance and prns and under what circumstances the prns are to be triggered using an action plan format where appropriate.  Total time for H and P, chart review, counseling, reviewing hfa/neb  device(s) and generating customized AVS unique to this office visit / same day charting = 30 min

## 2023-09-10 ENCOUNTER — Ambulatory Visit: Admitting: Internal Medicine

## 2023-09-10 ENCOUNTER — Encounter: Payer: Self-pay | Admitting: Internal Medicine

## 2023-09-10 VITALS — BP 130/82 | HR 71 | Ht 64.5 in | Wt 165.8 lb

## 2023-09-10 DIAGNOSIS — J449 Chronic obstructive pulmonary disease, unspecified: Secondary | ICD-10-CM

## 2023-09-10 DIAGNOSIS — Z87891 Personal history of nicotine dependence: Secondary | ICD-10-CM | POA: Diagnosis not present

## 2023-09-10 MED ORDER — BREZTRI AEROSPHERE 160-9-4.8 MCG/ACT IN AERO: 2.0000 | INHALATION_SPRAY | Freq: Two times a day (BID) | RESPIRATORY_TRACT | Status: AC

## 2023-09-10 NOTE — Assessment & Plan Note (Addendum)
 Quit smoking 06/2022 / MM  - 11/10/2014  new maint rx = symb 160/incruse sample - [facility] AT 11/24/2014 >  MM/ nl levels - 01/31/2015  extensive coaching HFA effectiveness =    90%  - spirometry 01/31/2015  FEV1  0.85 (37%) ratio 34  - Spirometry 08/29/2016  FEV1 0.71 (30%)  Ratio 36  > added back LAMA = incruse samples  - 06/12/2020    continue breztri   - 02/21/2021  After extensive coaching inhaler device,  effectiveness =    90%  - 02/21/2021 added pred x 6 days as Plan C on action plan> not using as of 09/09/2021    - 09/10/2023  After extensive coaching inhaler device,  effectiveness =    75% (short Ti on HFA) >>>   continue breztri  with more approp saba:  Re SABA :  I spent extra time with pt today reviewing appropriate use of albuterol  for prn use on exertion with the following points: 1) saba is for relief of sob that does not improve by walking a slower pace or resting but rather if the pt does not improve after trying this first. 2) If the pt is convinced, as many are, that saba helps recover from activity faster then it's easy to tell if this is the case by re-challenging : ie stop, take the inhaler, then p 5 minutes try the exact same activity (intensity of workload) that just caused the symptoms and see if they are substantially diminished or not after saba 3) if there is an activity that reproducibly causes the symptoms, try the saba 15 min before the activity on alternate days   If in fact the saba really does help, then fine to continue to use it prn but advised may need to look closer at the maintenance regimen being used (breztri  in this case)  to achieve better control of airways disease with exertion.   F/ q 6 m, sooner prn       Each maintenance medication was reviewed in detail including emphasizing most importantly the difference between maintenance and prns and under what circumstances the prns are to be triggered using an action plan format where appropriate.  Total time for  H and P, chart review, counseling, reviewing hfa/neb  device(s) and generating customized AVS unique to this office visit / same day charting = 30 min

## 2023-09-10 NOTE — Patient Instructions (Addendum)
 Remember [name] [name] is Take 2 puffs first thing in am and then another 2 puffs about 12 hours later- ONLY if doing great ok to leave off the pm dose    Please schedule a follow up visit in 6  months but call sooner if needed    Recuerde que Brezti se administra 2 inhalaciones a primera hora de la [place] 2 inhalaciones aproximadamente 12 horas despus. Solo si se encuentra bien, puede omitir la dosis vespertina?  Programe una visita de seguimiento en 6 meses, pero llame antes si es necesario.

## 2023-09-28 ENCOUNTER — Telehealth: Payer: Self-pay

## 2023-09-28 NOTE — Telephone Encounter (Signed)
 Copied from CRM [id number]. Topic: Clinical - Medication Question >> Sep 25, 2023 11:16 AM Celestine FALCON wrote: Reason for CRM: Pt's son on DPR [place] is calling regarding medication swap from budesonide -glycopyrrolate -formoterol  (BREZTRI  AEROSPHERE) 160-9-4.8 MCG/ACT AERO inhaler to Symbicort  inhaler. Pt has [employer] Complete as well as [facility] [facility] [place]. He wants to know if the pt's insurance would cover the new inhaler, and if Dr. Darlean would agree on making the change to her medication.   His phone number is [phone].  ATC x1 pts son per DPR. No answer- left vm to call back.   Routing to pharmacy to see if pts insurance will cover Symbicort . Please advise

## 2023-09-29 ENCOUNTER — Other Ambulatory Visit (HOSPITAL_COMMUNITY): Payer: Self-pay

## 2023-09-29 MED ORDER — BUDESONIDE-FORMOTEROL FUMARATE 160-4.5 MCG/ACT IN AERO: 2.0000 | INHALATION_SPRAY | Freq: Two times a day (BID) | RESPIRATORY_TRACT | 6 refills | Status: AC

## 2023-09-29 NOTE — Telephone Encounter (Signed)
 Called and spoke with the patients son (DPR). Advised brand name Symbicort  is covered with a zero copay.  Sending rx to preferred pharmacy. Nothing further needed.

## 2023-09-29 NOTE — Telephone Encounter (Signed)
 Dr. Darlean can you advise recommendations on pt switching to Symbicort  inhaler.

## 2023-11-03 ENCOUNTER — Telehealth: Payer: Self-pay | Admitting: Family Medicine

## 2023-11-03 DIAGNOSIS — J449 Chronic obstructive pulmonary disease, unspecified: Secondary | ICD-10-CM

## 2023-11-03 NOTE — Telephone Encounter (Signed)
 Copied from CRM #[id number]. Topic: Clinical - Medication Refill >> Nov 03, 2023  1:33 PM Nathanel DEL wrote: Medication: albuterol  (VENTOLIN  HFA) 108 (90 Base) MCG/ACT inhaler  Has the patient contacted their pharmacy? No ([facility].  This is the patient's preferred pharmacy:   [id number] [place], [place] - [address] [place] [address] Phone: [phone] Fax: [phone]  Is this the correct pharmacy for this prescription? Yes If no, delete pharmacy and type the correct one.   Has the prescription been filled recently? Yes  Is the patient out of the medication? No  Has the patient been seen for an appointment in the last year OR does the patient have an upcoming appointment? Yes  Can we respond through MyChart? No  Agent: Please be advised that Rx refills may take up to 3 business days. We ask that you follow-up with your pharmacy.

## 2023-11-05 MED ORDER — ALBUTEROL SULFATE HFA 108 (90 BASE) MCG/ACT IN AERS: INHALATION_SPRAY | RESPIRATORY_TRACT | 3 refills | Status: DC

## 2023-11-05 NOTE — Telephone Encounter (Signed)
 Refill sent in for you albuterol  to pharmacy on file

## 2023-11-12 NOTE — Progress Notes (Signed)
 This encounter was created in error - please disregard.

## 2023-11-16 ENCOUNTER — Other Ambulatory Visit: Payer: Self-pay

## 2023-11-16 MED ORDER — IPRATROPIUM-ALBUTEROL 0.5-2.5 (3) MG/3ML IN SOLN: RESPIRATORY_TRACT | 5 refills | Status: AC

## 2023-11-30 ENCOUNTER — Telehealth: Payer: Self-pay | Admitting: Family Medicine

## 2023-11-30 NOTE — Telephone Encounter (Unsigned)
 Copied from CRM [id number]. Topic: Clinical - Medication Question >> Nov 30, 2023  3:50 PM Essie A wrote: Reason for CRM: Patient's son, El[place], called on behalf of his mother.  She was on budesonide -glycopyrrolate -formoterol  (BREZTRI  AEROSPHERE) 160-9-4.8 MCG/ACT AERO inhaler and asked to switch to budesonide -formoterol  (SYMBICORT ) 160-4.5 MCG/ACT inhaler.  She would like to be switched back to Breztri  because it worked better for her.  Please return the call to let patient know if this can be done at [phone].  Thanks.

## 2023-11-30 NOTE — Telephone Encounter (Signed)
 Dr. Darlean please advise pt would like to switch back to Breztri .

## 2023-12-01 ENCOUNTER — Other Ambulatory Visit: Payer: Self-pay | Admitting: Internal Medicine

## 2023-12-01 NOTE — Telephone Encounter (Signed)
 ATC the pt x1. Went straight to vm. Also tried calling the patients son x1. No answer- LMTCB.

## 2023-12-02 ENCOUNTER — Telehealth: Payer: Self-pay | Admitting: Internal Medicine

## 2023-12-02 MED ORDER — BREZTRI AEROSPHERE 160-9-4.8 MCG/ACT IN AERO: 2.0000 | INHALATION_SPRAY | Freq: Two times a day (BID) | RESPIRATORY_TRACT | 5 refills | Status: AC

## 2023-12-02 NOTE — Telephone Encounter (Signed)
 Copied from CRM #[id number]. Topic: Clinical - Medication Refill >> Dec 02, 2023  2:32 PM [name] [name] wrote: Medication: Budeson-Glycopyrrol-Formoterol  (BREZTRI  AEROSPHERE) 160-9-4.8 MCG/ACT AERO   Has the patient contacted their pharmacy? Yes; Dr. Darlean agreed pt was okay to switch back to Budeson-Glycopyrrol-Formoterol  (BREZTRI  AEROSPHERE) 160-9-4.8 MCG/ACT AERO. (Agent: If no, request that the patient contact the pharmacy for the refill. If patient does not wish to contact the pharmacy document the reason why and proceed with request.) (Agent: If yes, when and what did the pharmacy advise?)  This is the patient's preferred pharmacy:  [id number] [place], [place] - [address] [place] [address] Phone: [phone] Fax: [phone]  Is this the correct pharmacy for this prescription? Yes If no, delete pharmacy and type the correct one.   Has the prescription been filled recently? Yes  Is the patient out of the medication? Yes  Has the patient been seen for an appointment in the last year OR does the patient have an upcoming appointment? Yes  Can we respond through MyChart? Yes  Agent: Please be advised that Rx refills may take up to 3 business days. We ask that you follow-up with your pharmacy.

## 2024-01-15 ENCOUNTER — Other Ambulatory Visit: Payer: Self-pay | Admitting: Internal Medicine

## 2024-01-15 DIAGNOSIS — J449 Chronic obstructive pulmonary disease, unspecified: Secondary | ICD-10-CM

## 2024-02-11 ENCOUNTER — Telehealth: Payer: Self-pay | Admitting: Internal Medicine

## 2024-02-11 NOTE — Telephone Encounter (Signed)
 Copied from CRM [id number]. Topic: Clinical - Order For Equipment >> Feb 11, 2024 10:25 AM Joesph PARAS wrote: Reason for CRM: Patient son is calling to inquire about how patient can keep her oxygen . Patient son states she got a letter but only knows they are trying to take her oxygen  from her and does not know why. Patient son does not know the name of the company. Patient would like to keep her oxygen  and would like to know what she needs to do to keep it. Can we look into this?

## 2024-02-12 NOTE — Telephone Encounter (Signed)
 I called and spoke with patient's son,[name] [name] (DPR) regarding her oxygen .  She needs to re qualify for her oxygen  and received a letter in the mail.  I advised him that she just needs an office visit for a walk test and we can send over new orders for oxygen  at that time.  She has an OV already scheduled for 03/14/24.  He was unsure who the DME was, but will call back and let us  know.  Nothing further needed.  I called [employer] (she was set up with them when she was d/c'd from the hospital), spoke with Nat, advised that patient received a letter regarding her oxygen  and that she needs to re qualify.  I let her know that she has an appointment with our office on 03/14/24 and at that time we will do the walk test and send in new oxygen  orders.  She stated she will make a note on her account.  Nothing further needed.

## 2024-03-14 ENCOUNTER — Ambulatory Visit: Admitting: Internal Medicine

## 2024-03-14 DIAGNOSIS — J449 Chronic obstructive pulmonary disease, unspecified: Secondary | ICD-10-CM
